# Patient Record
Sex: Male | Born: 1940 | Race: Black or African American | Hispanic: No | Marital: Married | Smoking: Never smoker
Health system: Southern US, Community
[De-identification: ages and names within clinical notes are randomized; demographics above are authoritative.]

## PROBLEM LIST (undated history)

## (undated) DIAGNOSIS — E7800 Pure hypercholesterolemia, unspecified: Secondary | ICD-10-CM

## (undated) DIAGNOSIS — M1990 Unspecified osteoarthritis, unspecified site: Secondary | ICD-10-CM

## (undated) DIAGNOSIS — I10 Essential (primary) hypertension: Secondary | ICD-10-CM

## (undated) DIAGNOSIS — N400 Enlarged prostate without lower urinary tract symptoms: Secondary | ICD-10-CM

## (undated) DIAGNOSIS — I872 Venous insufficiency (chronic) (peripheral): Secondary | ICD-10-CM

## (undated) DIAGNOSIS — M545 Low back pain: Secondary | ICD-10-CM

## (undated) DIAGNOSIS — I509 Heart failure, unspecified: Secondary | ICD-10-CM

## (undated) DIAGNOSIS — C61 Malignant neoplasm of prostate: Secondary | ICD-10-CM

## (undated) DIAGNOSIS — I639 Cerebral infarction, unspecified: Secondary | ICD-10-CM

## (undated) DIAGNOSIS — Z8739 Personal history of other diseases of the musculoskeletal system and connective tissue: Secondary | ICD-10-CM

## (undated) HISTORY — PX: KNEE ARTHROSCOPY W/ PCL AND LCL  REPAIR & TENDON GRAFT: SHX1883

## (undated) HISTORY — PX: BACK SURGERY: SHX140

## (undated) HISTORY — PX: TONSILLECTOMY: SUR1361

## (undated) HISTORY — DX: Malignant neoplasm of prostate: C61

## (undated) HISTORY — DX: Essential (primary) hypertension: I10

## (undated) HISTORY — DX: Low back pain: M54.5

## (undated) HISTORY — PX: CORRECTION HAMMER TOE: SUR317

## (undated) HISTORY — DX: Benign prostatic hyperplasia without lower urinary tract symptoms: N40.0

## (undated) HISTORY — DX: Unspecified osteoarthritis, unspecified site: M19.90

## (undated) HISTORY — PX: INGUINAL HERNIA REPAIR: SUR1180

---

## 1996-03-22 DIAGNOSIS — I639 Cerebral infarction, unspecified: Secondary | ICD-10-CM

## 1996-03-22 HISTORY — DX: Cerebral infarction, unspecified: I63.9

## 1997-09-22 ENCOUNTER — Emergency Department (HOSPITAL_COMMUNITY): Admission: EM | Admit: 1997-09-22 | Discharge: 1997-09-22 | Payer: Self-pay | Admitting: Emergency Medicine

## 1997-12-17 ENCOUNTER — Encounter: Payer: Self-pay | Admitting: Internal Medicine

## 1997-12-17 ENCOUNTER — Ambulatory Visit (HOSPITAL_COMMUNITY): Admission: RE | Admit: 1997-12-17 | Discharge: 1997-12-17 | Payer: Self-pay | Admitting: Internal Medicine

## 2000-01-08 ENCOUNTER — Encounter: Payer: Self-pay | Admitting: Emergency Medicine

## 2000-01-08 ENCOUNTER — Emergency Department (HOSPITAL_COMMUNITY): Admission: EM | Admit: 2000-01-08 | Discharge: 2000-01-08 | Payer: Self-pay | Admitting: Emergency Medicine

## 2000-02-01 ENCOUNTER — Emergency Department (HOSPITAL_COMMUNITY): Admission: EM | Admit: 2000-02-01 | Discharge: 2000-02-01 | Payer: Self-pay | Admitting: Emergency Medicine

## 2000-09-01 ENCOUNTER — Encounter: Payer: Self-pay | Admitting: Emergency Medicine

## 2000-09-01 ENCOUNTER — Emergency Department (HOSPITAL_COMMUNITY): Admission: EM | Admit: 2000-09-01 | Discharge: 2000-09-01 | Payer: Self-pay | Admitting: Emergency Medicine

## 2000-10-28 ENCOUNTER — Encounter: Payer: Self-pay | Admitting: Internal Medicine

## 2000-10-28 ENCOUNTER — Ambulatory Visit (HOSPITAL_COMMUNITY): Admission: RE | Admit: 2000-10-28 | Discharge: 2000-10-28 | Payer: Self-pay | Admitting: Internal Medicine

## 2000-12-28 ENCOUNTER — Encounter (INDEPENDENT_AMBULATORY_CARE_PROVIDER_SITE_OTHER): Payer: Self-pay

## 2000-12-28 ENCOUNTER — Other Ambulatory Visit: Admission: RE | Admit: 2000-12-28 | Discharge: 2000-12-28 | Payer: Self-pay | Admitting: Gastroenterology

## 2001-02-07 ENCOUNTER — Encounter: Payer: Self-pay | Admitting: Gastroenterology

## 2001-02-07 ENCOUNTER — Ambulatory Visit (HOSPITAL_COMMUNITY): Admission: RE | Admit: 2001-02-07 | Discharge: 2001-02-07 | Payer: Self-pay | Admitting: Gastroenterology

## 2001-03-31 ENCOUNTER — Encounter: Admission: RE | Admit: 2001-03-31 | Discharge: 2001-03-31 | Payer: Self-pay | Admitting: Urology

## 2001-03-31 ENCOUNTER — Encounter: Payer: Self-pay | Admitting: Urology

## 2001-05-25 ENCOUNTER — Ambulatory Visit (HOSPITAL_COMMUNITY): Admission: RE | Admit: 2001-05-25 | Discharge: 2001-05-25 | Payer: Self-pay | Admitting: Internal Medicine

## 2001-05-25 ENCOUNTER — Encounter: Payer: Self-pay | Admitting: Internal Medicine

## 2003-03-10 ENCOUNTER — Emergency Department (HOSPITAL_COMMUNITY): Admission: EM | Admit: 2003-03-10 | Discharge: 2003-03-10 | Payer: Self-pay | Admitting: Emergency Medicine

## 2003-11-11 ENCOUNTER — Ambulatory Visit (HOSPITAL_COMMUNITY): Admission: RE | Admit: 2003-11-11 | Discharge: 2003-11-11 | Payer: Self-pay | Admitting: Internal Medicine

## 2004-01-22 ENCOUNTER — Ambulatory Visit: Payer: Self-pay | Admitting: Gastroenterology

## 2005-04-10 ENCOUNTER — Encounter: Admission: RE | Admit: 2005-04-10 | Discharge: 2005-04-10 | Payer: Self-pay | Admitting: Family Medicine

## 2005-05-04 ENCOUNTER — Ambulatory Visit: Payer: Self-pay | Admitting: Gastroenterology

## 2005-05-18 ENCOUNTER — Ambulatory Visit: Payer: Self-pay | Admitting: Gastroenterology

## 2006-02-07 ENCOUNTER — Encounter: Admission: RE | Admit: 2006-02-07 | Discharge: 2006-02-07 | Payer: Self-pay | Admitting: Family Medicine

## 2006-04-14 ENCOUNTER — Encounter: Admission: RE | Admit: 2006-04-14 | Discharge: 2006-05-11 | Payer: Self-pay | Admitting: Neurology

## 2006-05-12 ENCOUNTER — Encounter: Admission: RE | Admit: 2006-05-12 | Discharge: 2006-07-21 | Payer: Self-pay | Admitting: Neurology

## 2006-05-21 ENCOUNTER — Emergency Department (HOSPITAL_COMMUNITY): Admission: EM | Admit: 2006-05-21 | Discharge: 2006-05-21 | Payer: Self-pay | Admitting: Emergency Medicine

## 2006-05-26 ENCOUNTER — Encounter: Admission: RE | Admit: 2006-05-26 | Discharge: 2006-05-26 | Payer: Self-pay | Admitting: Family Medicine

## 2006-06-22 ENCOUNTER — Ambulatory Visit: Payer: Self-pay | Admitting: Gastroenterology

## 2008-01-21 DIAGNOSIS — C61 Malignant neoplasm of prostate: Secondary | ICD-10-CM

## 2008-01-21 HISTORY — DX: Malignant neoplasm of prostate: C61

## 2008-01-21 HISTORY — PX: PROSTATE BIOPSY: SHX241

## 2008-01-26 ENCOUNTER — Encounter: Payer: Self-pay | Admitting: Internal Medicine

## 2008-01-26 ENCOUNTER — Ambulatory Visit: Payer: Self-pay | Admitting: Internal Medicine

## 2008-01-26 DIAGNOSIS — Z8679 Personal history of other diseases of the circulatory system: Secondary | ICD-10-CM | POA: Insufficient documentation

## 2008-01-26 DIAGNOSIS — M1990 Unspecified osteoarthritis, unspecified site: Secondary | ICD-10-CM | POA: Insufficient documentation

## 2008-01-26 DIAGNOSIS — S66819A Strain of other specified muscles, fascia and tendons at wrist and hand level, unspecified hand, initial encounter: Secondary | ICD-10-CM

## 2008-01-26 DIAGNOSIS — N400 Enlarged prostate without lower urinary tract symptoms: Secondary | ICD-10-CM

## 2008-01-26 DIAGNOSIS — S63599A Other specified sprain of unspecified wrist, initial encounter: Secondary | ICD-10-CM | POA: Insufficient documentation

## 2008-02-07 ENCOUNTER — Telehealth: Payer: Self-pay | Admitting: Internal Medicine

## 2008-02-09 ENCOUNTER — Ambulatory Visit: Payer: Self-pay | Admitting: Internal Medicine

## 2008-03-07 ENCOUNTER — Encounter: Payer: Self-pay | Admitting: Internal Medicine

## 2008-03-07 ENCOUNTER — Ambulatory Visit: Payer: Self-pay | Admitting: Internal Medicine

## 2008-03-22 DIAGNOSIS — M545 Low back pain, unspecified: Secondary | ICD-10-CM

## 2008-03-22 HISTORY — DX: Low back pain, unspecified: M54.50

## 2008-03-25 ENCOUNTER — Encounter: Admission: RE | Admit: 2008-03-25 | Discharge: 2008-04-03 | Payer: Self-pay | Admitting: Internal Medicine

## 2008-04-30 ENCOUNTER — Telehealth (INDEPENDENT_AMBULATORY_CARE_PROVIDER_SITE_OTHER): Payer: Self-pay | Admitting: *Deleted

## 2008-05-02 ENCOUNTER — Ambulatory Visit: Payer: Self-pay | Admitting: Internal Medicine

## 2008-05-02 DIAGNOSIS — Z8546 Personal history of malignant neoplasm of prostate: Secondary | ICD-10-CM

## 2008-05-02 LAB — CONVERTED CEMR LAB
Bilirubin Urine: NEGATIVE
CO2: 32 meq/L (ref 19–32)
Chloride: 101 meq/L (ref 96–112)
GFR calc Af Amer: 77 mL/min
Glucose, Bld: 113 mg/dL — ABNORMAL HIGH (ref 70–99)
HDL: 40.1 mg/dL (ref >39.0)
Hemoglobin, Urine: NEGATIVE
Leukocytes, UA: NEGATIVE
Lymphocytes Relative: 35.6 % (ref 12.0–46.0)
Monocytes Relative: 8.1 % (ref 3.0–12.0)
Neutrophils Relative %: 50.5 % (ref 43.0–77.0)
Nitrite: NEGATIVE
Platelets: 166 10*3/uL (ref 150–400)
Potassium: 3.8 meq/L (ref 3.5–5.1)
RDW: 11.6 % (ref 11.5–14.6)
Sodium: 141 meq/L (ref 135–145)
Total CHOL/HDL Ratio: 4.4
VLDL: 22 mg/dL (ref 0–40)

## 2008-05-07 ENCOUNTER — Encounter: Payer: Self-pay | Admitting: Internal Medicine

## 2008-05-07 LAB — CONVERTED CEMR LAB: Vit D, 1,25-Dihydroxy: 9 — ABNORMAL LOW (ref 30–89)

## 2008-05-15 ENCOUNTER — Telehealth: Payer: Self-pay | Admitting: Internal Medicine

## 2008-08-16 ENCOUNTER — Telehealth (INDEPENDENT_AMBULATORY_CARE_PROVIDER_SITE_OTHER): Payer: Self-pay | Admitting: *Deleted

## 2008-09-03 ENCOUNTER — Ambulatory Visit: Payer: Self-pay | Admitting: Internal Medicine

## 2008-09-03 DIAGNOSIS — E559 Vitamin D deficiency, unspecified: Secondary | ICD-10-CM

## 2008-09-27 ENCOUNTER — Ambulatory Visit: Payer: Self-pay | Admitting: Internal Medicine

## 2008-09-27 DIAGNOSIS — M545 Low back pain: Secondary | ICD-10-CM

## 2008-09-27 DIAGNOSIS — M79609 Pain in unspecified limb: Secondary | ICD-10-CM

## 2008-09-27 LAB — CONVERTED CEMR LAB
Bilirubin Urine: NEGATIVE
Leukocytes, UA: NEGATIVE
Nitrite: NEGATIVE
Specific Gravity, Urine: <=1.005 (ref 1.000–1.030)
pH: 6.5 (ref 5.0–8.0)

## 2008-10-11 ENCOUNTER — Ambulatory Visit: Payer: Self-pay | Admitting: Internal Medicine

## 2008-10-18 ENCOUNTER — Encounter: Admission: RE | Admit: 2008-10-18 | Discharge: 2008-10-18 | Payer: Self-pay | Admitting: Internal Medicine

## 2008-10-21 ENCOUNTER — Encounter: Payer: Self-pay | Admitting: Internal Medicine

## 2008-10-23 ENCOUNTER — Encounter: Payer: Self-pay | Admitting: Internal Medicine

## 2008-10-30 ENCOUNTER — Ambulatory Visit: Payer: Self-pay | Admitting: Internal Medicine

## 2008-11-13 ENCOUNTER — Ambulatory Visit: Payer: Self-pay | Admitting: Internal Medicine

## 2008-11-14 ENCOUNTER — Telehealth (INDEPENDENT_AMBULATORY_CARE_PROVIDER_SITE_OTHER): Payer: Self-pay | Admitting: *Deleted

## 2008-11-28 ENCOUNTER — Encounter: Admission: RE | Admit: 2008-11-28 | Discharge: 2009-02-26 | Payer: Self-pay | Admitting: Internal Medicine

## 2008-12-03 ENCOUNTER — Encounter: Payer: Self-pay | Admitting: Internal Medicine

## 2008-12-25 ENCOUNTER — Ambulatory Visit: Payer: Self-pay | Admitting: Internal Medicine

## 2009-01-09 ENCOUNTER — Encounter: Payer: Self-pay | Admitting: Internal Medicine

## 2009-03-13 ENCOUNTER — Ambulatory Visit: Payer: Self-pay | Admitting: Internal Medicine

## 2009-03-13 LAB — CONVERTED CEMR LAB
ALT: 27 units/L (ref 0–53)
Basophils Relative: 1.1 % (ref 0.0–3.0)
Bilirubin, Direct: 0.1 mg/dL (ref 0.0–0.3)
Calcium: 9.1 mg/dL (ref 8.4–10.5)
Chloride: 105 meq/L (ref 96–112)
Creatinine, Ser: 1.2 mg/dL (ref 0.4–1.5)
Eosinophils Absolute: 0.2 10*3/uL (ref 0.0–0.7)
HCT: 35.9 % — ABNORMAL LOW (ref 39.0–52.0)
Hemoglobin: 11.8 g/dL — ABNORMAL LOW (ref 13.0–17.0)
Lymphs Abs: 1.5 10*3/uL (ref 0.7–4.0)
MCHC: 32.8 g/dL (ref 30.0–36.0)
MCV: 85.9 fL (ref 78.0–100.0)
Monocytes Absolute: 0.4 10*3/uL (ref 0.1–1.0)
Neutro Abs: 1.8 10*3/uL (ref 1.4–7.7)
RBC: 4.18 M/uL — ABNORMAL LOW (ref 4.22–5.81)
Total Bilirubin: 1 mg/dL (ref 0.3–1.2)

## 2009-03-20 ENCOUNTER — Ambulatory Visit: Payer: Self-pay | Admitting: Internal Medicine

## 2009-03-20 DIAGNOSIS — T23209A Burn of second degree of unspecified hand, unspecified site, initial encounter: Secondary | ICD-10-CM | POA: Insufficient documentation

## 2009-04-23 ENCOUNTER — Telehealth: Payer: Self-pay | Admitting: Internal Medicine

## 2009-05-16 ENCOUNTER — Ambulatory Visit: Payer: Self-pay | Admitting: Internal Medicine

## 2009-05-16 DIAGNOSIS — R1013 Epigastric pain: Secondary | ICD-10-CM | POA: Insufficient documentation

## 2009-05-16 LAB — CONVERTED CEMR LAB
Albumin: 4.1 g/dL (ref 3.5–5.2)
BUN: 12 mg/dL (ref 6–23)
Calcium: 9.2 mg/dL (ref 8.4–10.5)
Eosinophils Relative: 5.3 % — ABNORMAL HIGH (ref 0.0–5.0)
GFR calc non Af Amer: 77.04 mL/min (ref >60)
Glucose, Bld: 92 mg/dL (ref 70–99)
HCT: 35.4 % — ABNORMAL LOW (ref 39.0–52.0)
Hemoglobin: 11.5 g/dL — ABNORMAL LOW (ref 13.0–17.0)
Ketones, ur: NEGATIVE mg/dL
Leukocytes, UA: NEGATIVE
Lipase: 15 units/L (ref 11.0–59.0)
Lymphocytes Relative: 43.6 % (ref 12.0–46.0)
Lymphs Abs: 1.8 10*3/uL (ref 0.7–4.0)
Monocytes Relative: 9.8 % (ref 3.0–12.0)
Neutro Abs: 1.6 10*3/uL (ref 1.4–7.7)
Platelets: 171 10*3/uL (ref 150.0–400.0)
Specific Gravity, Urine: <=1.005 (ref 1.000–1.030)
Total Protein: 7 g/dL (ref 6.0–8.3)
Urobilinogen, UA: 0.2 (ref 0.0–1.0)
WBC: 4 10*3/uL — ABNORMAL LOW (ref 4.5–10.5)
pH: 6 (ref 5.0–8.0)

## 2009-05-20 ENCOUNTER — Encounter: Admission: RE | Admit: 2009-05-20 | Discharge: 2009-05-20 | Payer: Self-pay | Admitting: Internal Medicine

## 2009-05-28 ENCOUNTER — Ambulatory Visit: Payer: Self-pay | Admitting: Internal Medicine

## 2009-08-06 ENCOUNTER — Ambulatory Visit: Payer: Self-pay | Admitting: Internal Medicine

## 2009-08-06 DIAGNOSIS — M109 Gout, unspecified: Secondary | ICD-10-CM | POA: Insufficient documentation

## 2009-08-07 LAB — CONVERTED CEMR LAB
Rhuematoid fact SerPl-aCnc: 20 intl units/mL (ref 0.0–20.0)
Sed Rate: 10 mm/hr (ref 0–22)
Uric Acid, Serum: 8.7 mg/dL — ABNORMAL HIGH (ref 4.0–7.8)

## 2009-09-24 ENCOUNTER — Ambulatory Visit: Payer: Self-pay | Admitting: Internal Medicine

## 2009-09-24 LAB — CONVERTED CEMR LAB
BUN: 16 mg/dL (ref 6–23)
Bilirubin, Direct: 0.2 mg/dL (ref 0.0–0.3)
Calcium: 9.3 mg/dL (ref 8.4–10.5)
Chloride: 107 meq/L (ref 96–112)
Creatinine, Ser: 1.2 mg/dL (ref 0.4–1.5)
GFR calc non Af Amer: 79.25 mL/min (ref >60)
Hgb A1c MFr Bld: 5.1 % (ref 4.6–6.5)
Total Bilirubin: 0.9 mg/dL (ref 0.3–1.2)

## 2009-10-15 ENCOUNTER — Ambulatory Visit: Payer: Self-pay | Admitting: Internal Medicine

## 2009-10-21 ENCOUNTER — Encounter: Payer: Self-pay | Admitting: Internal Medicine

## 2009-12-18 ENCOUNTER — Ambulatory Visit: Payer: Self-pay | Admitting: Internal Medicine

## 2009-12-18 DIAGNOSIS — K120 Recurrent oral aphthae: Secondary | ICD-10-CM

## 2009-12-22 ENCOUNTER — Telehealth: Payer: Self-pay | Admitting: Internal Medicine

## 2010-01-08 ENCOUNTER — Telehealth: Payer: Self-pay | Admitting: Internal Medicine

## 2010-01-13 ENCOUNTER — Telehealth: Payer: Self-pay | Admitting: Internal Medicine

## 2010-04-16 ENCOUNTER — Ambulatory Visit
Admission: RE | Admit: 2010-04-16 | Discharge: 2010-04-16 | Payer: Self-pay | Source: Home / Self Care | Attending: Internal Medicine | Admitting: Internal Medicine

## 2010-04-16 ENCOUNTER — Other Ambulatory Visit: Payer: Self-pay | Admitting: Internal Medicine

## 2010-04-16 DIAGNOSIS — H6120 Impacted cerumen, unspecified ear: Secondary | ICD-10-CM | POA: Insufficient documentation

## 2010-04-16 DIAGNOSIS — R1310 Dysphagia, unspecified: Secondary | ICD-10-CM | POA: Insufficient documentation

## 2010-04-16 LAB — BASIC METABOLIC PANEL
BUN: 17 mg/dL (ref 6–23)
Calcium: 9.6 mg/dL (ref 8.4–10.5)
Creatinine, Ser: 1.1 mg/dL (ref 0.4–1.5)
GFR: 84.96 mL/min (ref >60.00)
Glucose, Bld: 96 mg/dL (ref 70–99)

## 2010-04-16 LAB — CBC WITH DIFFERENTIAL/PLATELET
Basophils Absolute: 0 10*3/uL (ref 0.0–0.1)
Eosinophils Relative: 4.1 % (ref 0.0–5.0)
Lymphocytes Relative: 35 % (ref 12.0–46.0)
Lymphs Abs: 1.6 10*3/uL (ref 0.7–4.0)
Monocytes Relative: 8 % (ref 3.0–12.0)
Neutrophils Relative %: 52 % (ref 43.0–77.0)
Platelets: 167 10*3/uL (ref 150.0–400.0)
RDW: 12.9 % (ref 11.5–14.6)
WBC: 4.5 10*3/uL (ref 4.5–10.5)

## 2010-04-16 LAB — LIPID PANEL
Cholesterol: 186 mg/dL (ref 0–200)
LDL Cholesterol: 128 mg/dL — ABNORMAL HIGH (ref 0–99)
Total CHOL/HDL Ratio: 5
VLDL: 17.2 mg/dL (ref 0.0–40.0)

## 2010-04-16 LAB — URINALYSIS
Bilirubin Urine: NEGATIVE
Hemoglobin, Urine: NEGATIVE
Nitrite: NEGATIVE
Total Protein, Urine: NEGATIVE
Urine Glucose: NEGATIVE
Urobilinogen, UA: 0.2 (ref 0.0–1.0)

## 2010-04-16 LAB — HEPATIC FUNCTION PANEL
AST: 26 U/L (ref 0–37)
Bilirubin, Direct: 0.1 mg/dL (ref 0.0–0.3)
Total Bilirubin: 0.9 mg/dL (ref 0.3–1.2)

## 2010-04-21 NOTE — Assessment & Plan Note (Signed)
Summary: painful swollen hand  stc   Vital Signs:  Patient profile:   70 year old male Height:      76 inches Weight:      228.75 pounds BMI:     27.94 O2 Sat:      97 % on Room air Temp:     97.3 degrees F oral Pulse rate:   72 / minute BP sitting:   132 / 60  (left arm) Cuff size:   large  Vitals Entered By: Lucious Groves (Aug 06, 2009 2:55 PM)  O2 Flow:  Room air CC: C/O right hand freezing and swollen since yesterday./kb Is Patient Diabetic? No Pain Assessment Patient in pain? no      Comments Patient states that he is not taking Benicar or Flomax./kb   Primary Care Provider:  Tresa Garter MD  CC:  C/O right hand freezing and swollen since yesterday./kb.  History of Present Illness: C/o R hand joints swell acutely w/a lot of pain yesterday C/o pain 15 min after taking meds x 4 months  F/u HTN  Current Medications (verified): 1)  Labetalol Hcl 200 Mg Tabs (Labetalol Hcl) .... 2 Two Times A Day 2)  Bayer Low Strength 81 Mg Tbec (Aspirin) .... Take 1 Tablet By Mouth Once A Day 3)  Voltaren 1 %  Gel (Diclofenac Sodium) .... Two Times A Day  To Qid As Needed 4)  Felodipine 5 Mg Xr24h-Tab (Felodipine) .... Take 1 Tablet By Mouth Once A Day 5)  Claritin 10 Mg Tabs (Loratadine) .... Once Daily 6)  Flomax 0.4 Mg Cp24 (Tamsulosin Hcl) .... Take 1 Capsule By Mouth Nightly 7)  Vitamin D3 1000 Unit  Tabs (Cholecalciferol) .Marland Kitchen.. 1 By Mouth Daily 8)  Benicar 20 Mg Tabs (Olmesartan Medoxomil) .Marland Kitchen.. 1 Tablet By Mouth Daily 9)  Aciphex 20 Mg Tbec (Rabeprazole Sodium) .Marland Kitchen.. 1 By Mouth Once Daily Prn  Allergies (verified): 1)  ! Valium 2)  ! Cardura 3)  ! Norvasc 4)  * Altace 5)  * Lisinopril  Past History:  Social History: Last updated: 05/02/2008 Retired from [employer] Married 2 ch Never Smoked Alcohol use-no Drug use-no Regular exercise-yes  Past Medical History: Hypertension Osteoarthritis Benign prostatic hypertrophy  Dr Brunilda Payor Cerebrovascular accident, hx  of Prostate cancer, hx of 2009 Colon 2008 Dr Laurice Record D def 2010 Low back pain 2010 Gout  Physical Exam  General:  NAD Mouth:  Oral mucosa and oropharynx without lesions or exudates.  Teeth in good repair. Lungs:  Normal respiratory effort, chest expands symmetrically. Lungs are clear to auscultation, no crackles or wheezes. Heart:  Normal rate and regular rhythm. S1 and S2 normal without gallop, murmur, click, rub or other extra sounds. Abdomen:  Bowel sounds positive,abdomen soft and non-tender without masses, organomegaly or hernias noted. Msk:  R 2nd MCP swollen and tender Neurologic:  No cranial nerve deficits noted. Station and gait are normal. Plantar reflexes are down-going bilaterally. DTRs are symmetrical throughout. Sensory, motor and coordinative functions appear intact.   Impression & Recommendations:  Problem # 1:  HAND PAIN (ICD-729.5) R 2nd MCP likely due to #2 Assessment New Hydrocdone prn Orders: TLB-Sedimentation Rate (ESR) (85652-ESR) TLB-Uric Acid, Blood (84550-URIC) TLB-Rheumatoid Factor (RA) (47829-FA)  Problem # 2:  GOUT (ICD-274.9) Assessment: Deteriorated Predn taper  Problem # 3:  ABDOMINAL PAIN, EPIGASTRIC (ICD-789.06) Assessment: Comment Only Take meds w/food. RTC if not corrected the problem  Problem # 4:  HYPERTENSION (ICD-401.9) Assessment: Improved  His updated medication list for  this problem includes:    Labetalol Hcl 200 Mg Tabs (Labetalol hcl) .Marland Kitchen... 2 two times a day    Felodipine 5 Mg Xr24h-tab (Felodipine) .Marland Kitchen... Take 1 tablet by mouth once a day    Benicar 20 Mg Tabs (Olmesartan medoxomil) .Marland Kitchen... 1 tablet by mouth daily  BP today: 132/60 Prior BP: 154/72 (05/28/2009)  Prior 10 Yr Risk Heart Disease: Not enough information (01/26/2008)  Labs Reviewed: K+: 3.8 (05/16/2009) Creat: : 1.2 (05/16/2009)   Chol: 177 (05/02/2008)   HDL: 40.1 (05/02/2008)   LDL: 115 (05/02/2008)   TG: 111 (05/02/2008)  Complete Medication List: 1)   Labetalol Hcl 200 Mg Tabs (Labetalol hcl) .... 2 two times a day 2)  Bayer Low Strength 81 Mg Tbec (Aspirin) .... Take 1 tablet by mouth once a day 3)  Felodipine 5 Mg Xr24h-tab (Felodipine) .... Take 1 tablet by mouth once a day 4)  Claritin 10 Mg Tabs (Loratadine) .... Once daily 5)  Flomax 0.4 Mg Cp24 (Tamsulosin hcl) .... Take 1 capsule by mouth nightly 6)  Vitamin D3 1000 Unit Tabs (Cholecalciferol) .Marland Kitchen.. 1 by mouth daily 7)  Benicar 20 Mg Tabs (Olmesartan medoxomil) .Marland Kitchen.. 1 tablet by mouth daily 8)  Aciphex 20 Mg Tbec (Rabeprazole sodium) .Marland Kitchen.. 1 by mouth once daily prn 9)  Prednisone 10 Mg Tabs (Prednisone) .... Take 40mg  qd for 1 days, then 20 mg qd for 2 days, then 10mg  qd for 1 days, then stop. take pc as needed gout 10)  Hydrocodone-acetaminophen 10-325 Mg Tabs (Hydrocodone-acetaminophen) .Marland Kitchen.. 1 by mouth two times a day by mouth bid  Patient Instructions: 1)  Call if you are not better in a reasonable amount of time or if worse.  2)  Take meds with food Prescriptions: HYDROCODONE-ACETAMINOPHEN 10-325 MG TABS (HYDROCODONE-ACETAMINOPHEN) 1 by mouth two times a day by mouth bid  #60 x 0   Entered and Authorized by:   Tresa Garter MD   Signed by:   Tresa Garter MD on 08/06/2009   Method used:   Print then Give to Patient   RxID:   [id number] PREDNISONE 10 MG TABS (PREDNISONE) Take 40mg  qd for 1 days, then 20 mg qd for 2 days, then 10mg  qd for 1 days, then stop. Take pc as needed gout  #30 x 1   Entered and Authorized by:   Tresa Garter MD   Signed by:   Tresa Garter MD on 08/06/2009   Method used:   Electronically to        [facility] [address] [phone]* (retail)       [address]       [place], [place]  [address]       Ph: [phone] or [phone]       Fax: [phone]   RxID:   [id number]

## 2010-04-21 NOTE — Miscellaneous (Signed)
Summary: [facility]  [facility]   Imported By: Sherian Rein 05/06/2009 07:58:38  _____________________________________________________________________  External Attachment:    Type:   Image     Comment:   External Document

## 2010-04-21 NOTE — Progress Notes (Signed)
Summary: PA-Aciphex  Phone Note From Pharmacy   Summary of Call: PA-Aciphex is not covered by insurance .Insurance will cover omeprazole or nexium. Initial call taken by: Dagoberto Reef,  January 13, 2010 10:19 AM  Follow-up for Phone Call        ok Nexium Follow-up by: Tresa Garter MD,  January 13, 2010 5:49 PM  Additional Follow-up for Phone Call Additional follow up Details #1::        Pt informed  Additional Follow-up by: Lamar Sprinkles, CMA,  January 14, 2010 3:04 PM    New/Updated Medications: NEXIUM 40 MG CPDR (ESOMEPRAZOLE MAGNESIUM) 1 by mouth qam ( medically necessary) Prescriptions: NEXIUM 40 MG CPDR (ESOMEPRAZOLE MAGNESIUM) 1 by mouth qam ( medically necessary)  #30 x 12   Entered and Authorized by:   Tresa Garter MD   Signed by:   Lamar Sprinkles, CMA on 01/14/2010   Method used:   Electronically to        [facility] [address] [phone]* (retail)       [address]       [place], [place]  [address]       Ph: [phone] or [phone]       Fax: [phone]   RxID:   [id number]

## 2010-04-21 NOTE — Miscellaneous (Signed)
Summary: Initial Summary for [facility]  Initial Summary for PT/[facility]   Imported By: Sherian Rein 03/26/2009 09:52:30  _____________________________________________________________________  External Attachment:    Type:   Image     Comment:   External Document

## 2010-04-21 NOTE — Assessment & Plan Note (Signed)
Summary: ROOF OF MOUTH IS SORE/ HEAD HURTS ON R SIDE/NWS   Vital Signs:  Patient profile:   70 year old male Height:      76 inches Weight:      233 pounds BMI:     28.46 Temp:     97.2 degrees F oral Pulse rate:   80 / minute Pulse rhythm:   regular Resp:     16 per minute BP sitting:   160 / 88  (left arm) Cuff size:   regular  Vitals Entered By: Lanier Prude, Beverly Gust) (December 18, 2009 11:33 AM) CC: mouth sores and Rt side of head hurts Is Patient Diabetic? No Comments pt would like to change Benicar to Cozaar   Primary Care Provider:  Tresa Garter MD  CC:  mouth sores and Rt side of head hurts.  History of Present Illness: C/o painful ulcer on mouth roof on L since  Sat F/u HTN, occasional rash  Current Medications (verified): 1)  Labetalol Hcl 200 Mg Tabs (Labetalol Hcl) .... 2 Two Times A Day 2)  Bayer Low Strength 81 Mg Tbec (Aspirin) .... Take 1 Tablet By Mouth Once A Day 3)  Felodipine 5 Mg Xr24h-Tab (Felodipine) .... Take 1 Tablet By Mouth Once A Day 4)  Claritin 10 Mg Tabs (Loratadine) .... Once Daily 5)  Flomax 0.4 Mg Cp24 (Tamsulosin Hcl) .... Take 1 Capsule By Mouth Nightly 6)  Vitamin D3 1000 Unit  Tabs (Cholecalciferol) .Marland Kitchen.. 1 By Mouth Daily 7)  Benicar 20 Mg Tabs (Olmesartan Medoxomil) .Marland Kitchen.. 1 Tablet By Mouth Daily 8)  Aciphex 20 Mg Tbec (Rabeprazole Sodium) .Marland Kitchen.. 1 By Mouth Once Daily Prn 9)  Hydrocodone-Acetaminophen 10-325 Mg Tabs (Hydrocodone-Acetaminophen) .Marland Kitchen.. 1 By Mouth Two Times A Day By Mouth Bid  Allergies (verified): 1)  ! Valium 2)  ! Cardura 3)  ! Norvasc 4)  * Altace 5)  * Lisinopril  Past History:  Past Medical History: Last updated: 08/06/2009 Hypertension Osteoarthritis Benign prostatic hypertrophy  Dr Brunilda Payor Cerebrovascular accident, hx of Prostate cancer, hx of 2009 Colon 2008 Dr Laurice Record D def 2010 Low back pain 2010 Gout  Social History: Last updated: 05/02/2008 Retired from [employer] Married 2 ch Never  Smoked Alcohol use-no Drug use-no Regular exercise-yes  Review of Systems  The patient denies fever.    Physical Exam  General:  NAD Nose:  External nasal examination shows no deformity or inflammation. Nasal mucosa are pink and moist without lesions or exudates. Mouth:  1.4x0.6 cm clustr of erosions to th L from centr on hard palet Lungs:  Normal respiratory effort, chest expands symmetrically. Lungs are clear to auscultation, no crackles or wheezes. Heart:  Normal rate and regular rhythm. S1 and S2 normal without gallop, murmur, click, rub or other extra sounds. Abdomen:  Bowel sounds positive,abdomen soft and non-tender without masses, organomegaly or hernias noted. Msk:  R 2nd MCP swollen and tender - cyst L foot callus Skin:  Clear Psych:  Cognition and judgment appear intact. Alert and cooperative with normal attention span and concentration. No apparent delusions, illusions, hallucinations   Impression & Recommendations:  Problem # 1:  APHTHOUS ULCERS (ICD-528.2) Assessment New See "Patient Instructions" and meds.  Problem # 2:  HYPERTENSION (ICD-401.9) Assessment: Unchanged  The following medications were removed from the medication list:    Benicar 20 Mg Tabs (Olmesartan medoxomil) .Marland Kitchen... 1 tablet by mouth daily His updated medication list for this problem includes:    Labetalol Hcl 200 Mg Tabs (Labetalol  hcl) ..... 2 two times a day    Felodipine 5 Mg Xr24h-tab (Felodipine) .Marland Kitchen... Take 1 tablet by mouth once a day    Cozaar 100 Mg Tabs (Losartan potassium) .Marland Kitchen... 1 by mouth qd  Problem # 3:  ECZEMA (ICD-692.9) Assessment: Unchanged  His updated medication list for this problem includes:    Claritin 10 Mg Tabs (Loratadine) ..... Once daily    Clobetasol Propionate 0.05 % Crea (Clobetasol propionate) ..... Use bid  Complete Medication List: 1)  Labetalol Hcl 200 Mg Tabs (Labetalol hcl) .... 2 two times a day 2)  Bayer Low Strength 81 Mg Tbec (Aspirin) .... Take 1  tablet by mouth once a day 3)  Felodipine 5 Mg Xr24h-tab (Felodipine) .... Take 1 tablet by mouth once a day 4)  Claritin 10 Mg Tabs (Loratadine) .... Once daily 5)  Flomax 0.4 Mg Cp24 (Tamsulosin hcl) .... Take 1 capsule by mouth nightly 6)  Vitamin D3 1000 Unit Tabs (Cholecalciferol) .Marland Kitchen.. 1 by mouth daily 7)  Aciphex 20 Mg Tbec (Rabeprazole sodium) .Marland Kitchen.. 1 by mouth once daily prn 8)  Hydrocodone-acetaminophen 10-325 Mg Tabs (Hydrocodone-acetaminophen) .Marland Kitchen.. 1 by mouth two times a day by mouth bid 9)  Acyclovir 400 Mg Tabs (Acyclovir) .Marland Kitchen.. 1 by mouth three times a day as needed herpes x 7 d 10)  Majic Mouthwash  .Marland Kitchen.. 10 ml - swish, hold and swallow qid 11)  Oragesic Soln (Mouthwashes) 12)  Cozaar 100 Mg Tabs (Losartan potassium) .Marland Kitchen.. 1 by mouth qd 13)  Clobetasol Propionate 0.05 % Crea (Clobetasol propionate) .... Use bid  Other Orders: Flu Vaccine 64yrs + MEDICARE PATIENTS (G9562) Administration Flu vaccine - MCR (Z3086)  Patient Instructions: 1)  Can use oragel 2)  Call if you are not better in a reasonable amount of time or if worse.  Prescriptions: CLOBETASOL PROPIONATE 0.05 % CREA (CLOBETASOL PROPIONATE) use bid  #90 g x 3   Entered and Authorized by:   Tresa Garter MD   Signed by:   Tresa Garter MD on 12/18/2009   Method used:   Print then Give to Patient   RxID:   [id number] COZAAR 100 MG TABS (LOSARTAN POTASSIUM) 1 by mouth qd  #30 x 11   Entered and Authorized by:   Tresa Garter MD   Signed by:   Tresa Garter MD on 12/18/2009   Method used:   Print then Give to Patient   RxID:   [id number] HYDROCODONE-ACETAMINOPHEN 10-325 MG TABS (HYDROCODONE-ACETAMINOPHEN) 1 by mouth two times a day by mouth bid  #30 x 0   Entered and Authorized by:   Tresa Garter MD   Signed by:   Tresa Garter MD on 12/18/2009   Method used:   Print then Give to Patient   RxID:   [id number] MAJIC MOUTHWASH 10 ml - swish, hold and swallow qid   #300 ml x 0   Entered and Authorized by:   Tresa Garter MD   Signed by:   Tresa Garter MD on 12/18/2009   Method used:   Print then Give to Patient   RxID:   [id number] ACYCLOVIR 400 MG TABS (ACYCLOVIR) 1 by mouth three times a day as needed herpes x 7 d  #21 x 3   Entered and Authorized by:   Tresa Garter MD   Signed by:   Tresa Garter MD on 12/18/2009   Method used:   Print then Give to Patient   RxID:  [id number]  .lbmedflu   Flu Vaccine Consent Questions     Do you have a history of severe allergic reactions to this vaccine? no    Any prior history of allergic reactions to egg and/or gelatin? no    Do you have a sensitivity to the preservative Thimersol? no    Do you have a past history of Guillan-Barre Syndrome? no    Do you currently have an acute febrile illness? no    Have you ever had a severe reaction to latex? no    Vaccine information given and explained to patient? yes    Are you currently pregnant? no    Lot Number:[id number]   Exp Date:09/19/2010   Site Given  Right Deltoid IM Lanier Prude, [facility])  December 18, 2009 11:38 AM

## 2010-04-21 NOTE — Progress Notes (Signed)
Summary: refill  Phone Note Refill Request   Refills Requested: Medication #1:  LABETALOL HCL 200 MG TABS 2 two times a day Pharmacy states that they never rec'd refill x12 in 08/2008. I refaxed refills for patient.  Initial call taken by: Lucious Groves,  April 23, 2009 10:32 AM    Prescriptions: LABETALOL HCL 200 MG TABS (LABETALOL HCL) 2 two times a day  #120 Tablet x 6   Entered by:   Lucious Groves   Authorized by:   Tresa Garter MD   Signed by:   Lucious Groves on 04/23/2009   Method used:   Re-Faxed to ...       [facility]  [address] [phone]* (retail)       [address]       [place], [place]  [address]       Ph: [phone] or [phone]       Fax: [phone]   RxID:   [id number]

## 2010-04-21 NOTE — Progress Notes (Signed)
Summary: MOUTHWASH  Phone Note Call from Patient   Summary of Call: Patient is requesting orajesic mouthwash to go to [facility]   Also needs majic mouthwash, it was lost. This is resent but please advise of above request.  Initial call taken by: Lamar Sprinkles, CMA,  December 22, 2009 10:33 AM  Follow-up for Phone Call        ok oragesic (it is OTC) too  Follow-up by: Tresa Garter MD,  December 22, 2009 1:23 PM  Additional Follow-up for Phone Call Additional follow up Details #1::        Pt informed  Additional Follow-up by: Lamar Sprinkles, CMA,  December 22, 2009 1:38 PM    New/Updated Medications: ORAGESIC  SOLN (MOUTHWASHES) as dirr Prescriptions: ORAGESIC  SOLN (MOUTHWASHES) as dirr  #1 x 1   Entered and Authorized by:   Tresa Garter MD   Signed by:   Lamar Sprinkles, CMA on 12/22/2009   Method used:   Faxed to ...       [facility]  [address] [phone]* (retail)       [address]       [place], [place]  [address]       Ph: [phone] or [phone]       Fax: [phone]   RxID:   [id number] MAJIC MOUTHWASH 10 ml - swish, hold and swallow qid  #300 ml x 0   Entered by:   Lamar Sprinkles, CMA   Authorized by:   Tresa Garter MD   Signed by:   Lamar Sprinkles, CMA on 12/22/2009   Method used:   Faxed to ...       [facility]  [address] [phone]* (retail)       [address]       [place], [place]  [address]       Ph: [phone] or [phone]       Fax: [phone]   RxID:   [id number]

## 2010-04-21 NOTE — Assessment & Plan Note (Signed)
Summary: 4 MO ROV /NWS  #   Vital Signs:  Patient profile:   70 year old male Height:      76 inches Weight:      235 pounds BMI:     28.71 O2 Sat:      96 % on Room air Temp:     98.1 degrees F oral Pulse rate:   77 / minute Pulse rhythm:   regular Resp:     16 per minute BP sitting:   160 / 80  (left arm) Cuff size:   large  Vitals Entered By: Lanier Prude, CMA(AAMA) (October 15, 2009 1:42 PM)  O2 Flow:  Room air CC: 4 mo f/u Is Patient Diabetic? No Comments pt needs Rf on Benicar 20mg  1 once daily .   Primary Care Provider:  Tresa Garter MD  CC:  4 mo f/u.  History of Present Illness: The patient presents for a follow up of hypertension, LBP C/o RUQ pain at times - long time   Current Medications (verified): 1)  Labetalol Hcl 200 Mg Tabs (Labetalol Hcl) .... 2 Two Times A Day 2)  Bayer Low Strength 81 Mg Tbec (Aspirin) .... Take 1 Tablet By Mouth Once A Day 3)  Felodipine 5 Mg Xr24h-Tab (Felodipine) .... Take 1 Tablet By Mouth Once A Day 4)  Claritin 10 Mg Tabs (Loratadine) .... Once Daily 5)  Flomax 0.4 Mg Cp24 (Tamsulosin Hcl) .... Take 1 Capsule By Mouth Nightly 6)  Vitamin D3 1000 Unit  Tabs (Cholecalciferol) .Marland Kitchen.. 1 By Mouth Daily 7)  Benicar 20 Mg Tabs (Olmesartan Medoxomil) .Marland Kitchen.. 1 Tablet By Mouth Daily 8)  Aciphex 20 Mg Tbec (Rabeprazole Sodium) .Marland Kitchen.. 1 By Mouth Once Daily Prn 9)  Prednisone 10 Mg Tabs (Prednisone) .... Take 40mg  Qd For 1 Days, Then 20 Mg Qd For 2 Days, Then 10mg  Qd For 1 Days, Then Stop. Take Pc As Needed Gout 10)  Hydrocodone-Acetaminophen 10-325 Mg Tabs (Hydrocodone-Acetaminophen) .Marland Kitchen.. 1 By Mouth Two Times A Day By Mouth Bid  Allergies (verified): 1)  ! Valium 2)  ! Cardura 3)  ! Norvasc 4)  * Altace 5)  * Lisinopril  Past History:  Past Medical History: Last updated: 08/06/2009 Hypertension Osteoarthritis Benign prostatic hypertrophy  Dr Brunilda Payor Cerebrovascular accident, hx of Prostate cancer, hx of 2009 Colon 2008 Dr  Laurice Record D def 2010 Low back pain 2010 Gout  Past Surgical History: Last updated: 05/02/2008 Denies surgical history XRT seeds 2009  Social History: Last updated: 05/02/2008 Retired from [employer] Married 2 ch Never Smoked Alcohol use-no Drug use-no Regular exercise-yes  Family History: Reviewed history from 05/16/2009 and no changes required. Family History Diabetes 1st degree relative Family History Hypertension Family History of Cardiovascular disorder No GS  Social History: Reviewed history from 05/02/2008 and no changes required. Retired from [employer] Married 2 ch Never Smoked Alcohol use-no Drug use-no Regular exercise-yes  Review of Systems  The patient denies fever, dyspnea on exertion, hematochezia, and depression.    Physical Exam  General:  NAD Head:  Normocephalic and atraumatic without obvious abnormalities. No apparent alopecia or balding. Eyes:  No corneal or conjunctival inflammation noted. EOMI. Perrla. Mouth:  Oral mucosa and oropharynx without lesions or exudates.  Teeth in good repair. Neck:  No deformities, masses, or tenderness noted. Lungs:  Normal respiratory effort, chest expands symmetrically. Lungs are clear to auscultation, no crackles or wheezes. Heart:  Normal rate and regular rhythm. S1 and S2 normal without gallop, murmur, click, rub  or other extra sounds. Abdomen:  Bowel sounds positive,abdomen soft and non-tender without masses, organomegaly or hernias noted. Msk:  R 2nd MCP swollen and tender - cyst L foot callus Neurologic:  No cranial nerve deficits noted. Station and gait are normal. Plantar reflexes are down-going bilaterally. DTRs are symmetrical throughout. Sensory, motor and coordinative functions appear intact. Skin:  L dorsal hand erosions Psych:  Cognition and judgment appear intact. Alert and cooperative with normal attention span and concentration. No apparent delusions, illusions, hallucinations   Impression &  Recommendations:  Problem # 1:  LOW BACK PAIN (ICD-724.2) Assessment Improved  His updated medication list for this problem includes:    Bayer Low Strength 81 Mg Tbec (Aspirin) .Marland Kitchen... Take 1 tablet by mouth once a day    Hydrocodone-acetaminophen 10-325 Mg Tabs (Hydrocodone-acetaminophen) .Marland Kitchen... 1 by mouth two times a day by mouth bid  Problem # 2:  HAND PAIN (ICD-729.5) R 3d finger ganglion Assessment: New  Orders: Orthopedic Referral (Ortho)  Problem # 3:  LEG PAIN (ICD-729.5) - L foot pain - callus Assessment: New Trimmed w/a blade  Problem # 4:  OSTEOARTHRITIS (ICD-715.90) Assessment: Unchanged  His updated medication list for this problem includes:    Bayer Low Strength 81 Mg Tbec (Aspirin) .Marland Kitchen... Take 1 tablet by mouth once a day    Hydrocodone-acetaminophen 10-325 Mg Tabs (Hydrocodone-acetaminophen) .Marland Kitchen... 1 by mouth two times a day by mouth bid  Problem # 5:  HYPERTENSION (ICD-401.9) Assessment: Comment Only States BP is good at home. Call me if BP is up His updated medication list for this problem includes:    Labetalol Hcl 200 Mg Tabs (Labetalol hcl) .Marland Kitchen... 2 two times a day    Felodipine 5 Mg Xr24h-tab (Felodipine) .Marland Kitchen... Take 1 tablet by mouth once a day    Benicar 20 Mg Tabs (Olmesartan medoxomil) .Marland Kitchen... 1 tablet by mouth daily  Problem # 6:  ABDOMINAL PAIN, EPIGASTRIC (ICD-789.06)  unclear etiol. - poss billiary dysfunction Assessment: Comment Only  Complete Medication List: 1)  Labetalol Hcl 200 Mg Tabs (Labetalol hcl) .... 2 two times a day 2)  Bayer Low Strength 81 Mg Tbec (Aspirin) .... Take 1 tablet by mouth once a day 3)  Felodipine 5 Mg Xr24h-tab (Felodipine) .... Take 1 tablet by mouth once a day 4)  Claritin 10 Mg Tabs (Loratadine) .... Once daily 5)  Flomax 0.4 Mg Cp24 (Tamsulosin hcl) .... Take 1 capsule by mouth nightly 6)  Vitamin D3 1000 Unit Tabs (Cholecalciferol) .Marland Kitchen.. 1 by mouth daily 7)  Benicar 20 Mg Tabs (Olmesartan medoxomil) .Marland Kitchen.. 1 tablet by  mouth daily 8)  Aciphex 20 Mg Tbec (Rabeprazole sodium) .Marland Kitchen.. 1 by mouth once daily prn 9)  Hydrocodone-acetaminophen 10-325 Mg Tabs (Hydrocodone-acetaminophen) .Marland Kitchen.. 1 by mouth two times a day by mouth bid  Other Orders: Prescription Created Electronically [id number] [id number])  Patient Instructions: 1)  Please schedule a follow-up appointment in 6 months well w/labs. Prescriptions: CLARITIN 10 MG TABS (LORATADINE) once daily  #90 x 3   Entered and Authorized by:   Tresa Garter MD   Signed by:   Tresa Garter MD on 10/15/2009   Method used:   Electronically to        [facility] [address] [phone]* (retail)       [address]       [place], [place]  [address]       Ph: [phone] or [phone]  Fax: [phone]   RxID:   [id number] BENICAR 20 MG TABS (OLMESARTAN MEDOXOMIL) 1 tablet by mouth daily  #90 x 3   Entered and Authorized by:   Tresa Garter MD   Signed by:   Tresa Garter MD on 10/15/2009   Method used:   Electronically to        [facility] [address] [phone]* (retail)       [address]       [place], [place]  [address]       Ph: [phone] or [phone]       Fax: [phone]   RxID:   [id number]

## 2010-04-21 NOTE — Assessment & Plan Note (Signed)
Summary: 2 mos f/u /#/cd   Vital Signs:  Patient profile:   70 year old male Weight:      226 pounds Temp:     97.8 degrees F oral Pulse rate:   69 / minute BP sitting:   156 / 84  (left arm)  Vitals Entered By: Tora Perches (May 16, 2009 2:02 PM) CC: f/u Is Patient Diabetic? No   Primary Care Provider:  Tresa Garter MD  CC:  f/u.  History of Present Illness: C/o RUQ abd pain x 2 months after the time when he is taking his meds. In pain now and nauseated  Preventive Screening-Counseling & Management  Alcohol-Tobacco     Smoking Status: never  Current Medications (verified): 1)  Labetalol Hcl 200 Mg Tabs (Labetalol Hcl) .... 2 Two Times A Day 2)  Bayer Low Strength 81 Mg Tbec (Aspirin) .... Take 1 Tablet By Mouth Once A Day 3)  Voltaren 1 %  Gel (Diclofenac Sodium) .... Two Times A Day  To Qid As Needed 4)  Felodipine 5 Mg Xr24h-Tab (Felodipine) .... Take 1 Tablet By Mouth Once A Day 5)  Claritin 10 Mg Tabs (Loratadine) .... Once Daily 6)  Hydrocodone-Acetaminophen 10-325 Mg Tabs (Hydrocodone-Acetaminophen) .Marland Kitchen.. 1 By Mouth Up To 4 Time Per Day As Needed For Pain 7)  Flomax 0.4 Mg Cp24 (Tamsulosin Hcl) .... Take 1 Capsule By Mouth Nightly 8)  Cyclobenzaprine Hcl 10 Mg Tabs (Cyclobenzaprine Hcl) .Marland Kitchen.. 1 By Mouth Three Times A Day As Needed Spasm in Back 9)  Diclofenac Sodium 75 Mg Tbec (Diclofenac Sodium) .Marland Kitchen.. 1 By Mouth Two Times A Day Pc As Needed For Pain 10)  Vitamin D3 1000 Unit  Tabs (Cholecalciferol) .Marland Kitchen.. 1 By Mouth Daily 11)  Benicar 20 Mg Tabs (Olmesartan Medoxomil) .Marland Kitchen.. 1 Tablet By Mouth Daily  Allergies: 1)  ! Valium 2)  ! Cardura 3)  ! Norvasc 4)  * Altace 5)  * Lisinopril  Past History:  Past Medical History: Last updated: 11/13/2008 Hypertension Osteoarthritis Benign prostatic hypertrophy  Dr Brunilda Payor Cerebrovascular accident, hx of Prostate cancer, hx of 2009 Colon 2008 Dr Laurice Record D def 2010 Low back pain 2010  Past Surgical  History: Last updated: 05/02/2008 Denies surgical history XRT seeds 2009  Social History: Last updated: 05/02/2008 Retired from [employer] Married 2 ch Never Smoked Alcohol use-no Drug use-no Regular exercise-yes  Family History: Family History Diabetes 1st degree relative Family History Hypertension Family History of Cardiovascular disorder No GS   Impression & Recommendations:  Problem # 1:  ABDOMINAL PAIN, EPIGASTRIC (ICD-789.06) ? etiol Assessment New  Orders: Radiology Referral (Radiology) TLB-BMP (Basic Metabolic Panel-BMET) (80048-METABOL) TLB-Hepatic/Liver Function Pnl (80076-HEPATIC) TLB-CBC Platelet - w/Differential (85025-CBCD) TLB-Lipase (83690-LIPASE) TLB-Udip ONLY (81003-UDIP)  Problem # 2:  LEG PAIN (ICD-729.5) resolved Assessment: Comment Only  Problem # 3:  LOW BACK PAIN (ICD-724.2) resolved Assessment: Comment Only  The following medications were removed from the medication list:    Hydrocodone-acetaminophen 10-325 Mg Tabs (Hydrocodone-acetaminophen) .Marland Kitchen... 1 by mouth up to 4 time per day as needed for pain    Cyclobenzaprine Hcl 10 Mg Tabs (Cyclobenzaprine hcl) .Marland Kitchen... 1 by mouth three times a day as needed spasm in back    Diclofenac Sodium 75 Mg Tbec (Diclofenac sodium) .Marland Kitchen... 1 by mouth two times a day pc as needed for pain His updated medication list for this problem includes:    Bayer Low Strength 81 Mg Tbec (Aspirin) .Marland Kitchen... Take 1 tablet by mouth once a day  Problem # 4:  HYPERTENSION (ICD-401.9) Assessment: Unchanged  His updated medication list for this problem includes:    Labetalol Hcl 200 Mg Tabs (Labetalol hcl) .Marland Kitchen... 2 two times a day    Felodipine 5 Mg Xr24h-tab (Felodipine) .Marland Kitchen... Take 1 tablet by mouth once a day    Benicar 20 Mg Tabs (Olmesartan medoxomil) .Marland Kitchen... 1 tablet by mouth daily  BP today: 156/84 Prior BP: 160/80 (03/20/2009)  Prior 10 Yr Risk Heart Disease: Not enough information (01/26/2008)  Labs Reviewed: K+: 4.3  (03/13/2009) Creat: : 1.2 (03/13/2009)   Chol: 177 (05/02/2008)   HDL: 40.1 (05/02/2008)   LDL: 115 (05/02/2008)   TG: 111 (05/02/2008)  Complete Medication List: 1)  Labetalol Hcl 200 Mg Tabs (Labetalol hcl) .... 2 two times a day 2)  Bayer Low Strength 81 Mg Tbec (Aspirin) .... Take 1 tablet by mouth once a day 3)  Voltaren 1 % Gel (Diclofenac sodium) .... Two times a day  to qid as needed 4)  Felodipine 5 Mg Xr24h-tab (Felodipine) .... Take 1 tablet by mouth once a day 5)  Claritin 10 Mg Tabs (Loratadine) .... Once daily 6)  Flomax 0.4 Mg Cp24 (Tamsulosin hcl) .... Take 1 capsule by mouth nightly 7)  Vitamin D3 1000 Unit Tabs (Cholecalciferol) .Marland Kitchen.. 1 by mouth daily 8)  Benicar 20 Mg Tabs (Olmesartan medoxomil) .Marland Kitchen.. 1 tablet by mouth daily 9)  Aciphex 20 Mg Tbec (Rabeprazole sodium) .Marland Kitchen.. 1 by mouth qd  Patient Instructions: 1)  Please schedule a follow-up appointment in 2 weeks. 2)  Call if you are not better in a reasonable amount of time or if worse. Go to ER if feeling really bad!  Prescriptions: ACIPHEX 20 MG TBEC (RABEPRAZOLE SODIUM) 1 by mouth qd  #30 x 6   Entered and Authorized by:   Tresa Garter MD   Signed by:   Tresa Garter MD on 05/16/2009   Method used:   Print then Give to Patient   RxID:   [id number]   Appended Document: 2 mos f/u /#/cd PE: NAD CV RRR Lungs CTA Abd Sensitive in RUQ, no HSM, no rebound Skin clear LE w/o edema B Neuro CN2-12 nl HEENT moist a/o/c

## 2010-04-21 NOTE — Consult Note (Signed)
Summary: Berline Lopes MD  Berline Lopes MD   Imported By: Lester Haleburg 11/04/2009 09:21:23  _____________________________________________________________________  External Attachment:    Type:   Image     Comment:   External Document

## 2010-04-21 NOTE — Progress Notes (Signed)
  Phone Note Call from Patient Call back at [facility] Phone [phone]   Summary of Call: Pt was unable to find oragel mouthwash and wants to know if there are any alternatives?  Initial call taken by: Lamar Sprinkles, CMA,  January 08, 2010 10:54 AM  Follow-up for Phone Call        Oragel is a small tube with a gel, not a mouthwash. Pls ask the pharmacist Follow-up by: Tresa Garter MD,  January 11, 2010 11:21 AM  Additional Follow-up for Phone Call Additional follow up Details #1::        Pt informed  Additional Follow-up by: Lamar Sprinkles, CMA,  January 12, 2010 12:31 PM    Prescriptions: ACIPHEX 20 MG TBEC (RABEPRAZOLE SODIUM) 1 by mouth once daily prn  #30 x 6   Entered by:   Lamar Sprinkles, CMA   Authorized by:   Tresa Garter MD   Signed by:   Lamar Sprinkles, CMA on 01/08/2010   Method used:   Electronically to        [facility] [phone]* (retail)       [address]       [place], [place]  [address]       Ph: [phone] or [phone]       Fax: [phone]   RxID:   [id number]

## 2010-04-21 NOTE — Assessment & Plan Note (Signed)
Summary: 2 WK FU---STC   Vital Signs:  Patient profile:   69 year old male Weight:      228 pounds Temp:     9.69 degrees F oral Pulse rate:   69 / minute BP sitting:   154 / 72  (left arm)  Vitals Entered By: Tora Perches (May 28, 2009 3:55 PM) CC: f/u Is Patient Diabetic? No   Primary Care Provider:  Tresa Garter MD  CC:  f/u.  History of Present Illness: F/u abd pain - resolved in 1 d  Preventive Screening-Counseling & Management  Alcohol-Tobacco     Smoking Status: never  Current Medications (verified): 1)  Labetalol Hcl 200 Mg Tabs (Labetalol Hcl) .... 2 Two Times A Day 2)  Bayer Low Strength 81 Mg Tbec (Aspirin) .... Take 1 Tablet By Mouth Once A Day 3)  Voltaren 1 %  Gel (Diclofenac Sodium) .... Two Times A Day  To Qid As Needed 4)  Felodipine 5 Mg Xr24h-Tab (Felodipine) .... Take 1 Tablet By Mouth Once A Day 5)  Claritin 10 Mg Tabs (Loratadine) .... Once Daily 6)  Flomax 0.4 Mg Cp24 (Tamsulosin Hcl) .... Take 1 Capsule By Mouth Nightly 7)  Vitamin D3 1000 Unit  Tabs (Cholecalciferol) .Marland Kitchen.. 1 By Mouth Daily 8)  Benicar 20 Mg Tabs (Olmesartan Medoxomil) .Marland Kitchen.. 1 Tablet By Mouth Daily 9)  Aciphex 20 Mg Tbec (Rabeprazole Sodium) .Marland Kitchen.. 1 By Mouth Qd  Allergies: 1)  ! Valium 2)  ! Cardura 3)  ! Norvasc 4)  * Altace 5)  * Lisinopril  Physical Exam  General:  NAD Mouth:  Oral mucosa and oropharynx without lesions or exudates.  Teeth in good repair. Lungs:  Normal respiratory effort, chest expands symmetrically. Lungs are clear to auscultation, no crackles or wheezes. Heart:  Normal rate and regular rhythm. S1 and S2 normal without gallop, murmur, click, rub or other extra sounds. Abdomen:  Bowel sounds positive,abdomen soft and non-tender without masses, organomegaly or hernias noted. Msk:  Lumbar-sacral spine is not tender to palpation over paraspinal muscles and painfull with the ROM  Neurologic:  No cranial nerve deficits noted. Station and gait are  normal. Plantar reflexes are down-going bilaterally. DTRs are symmetrical throughout. Sensory, motor and coordinative functions appear intact.   Impression & Recommendations:  Problem # 1:  ABDOMINAL PAIN, EPIGASTRIC (ICD-789.06) resolved Assessment Improved Labs/US nl  Problem # 2:  Small renal cysts  Problem # 3:  LOW BACK PAIN (ICD-724.2) - gone Assessment: Improved  His updated medication list for this problem includes:    Bayer Low Strength 81 Mg Tbec (Aspirin) .Marland Kitchen... Take 1 tablet by mouth once a day  Problem # 4:  HYPERTENSION (ICD-401.9) Assessment: Unchanged  His updated medication list for this problem includes:    Labetalol Hcl 200 Mg Tabs (Labetalol hcl) .Marland Kitchen... 2 two times a day    Felodipine 5 Mg Xr24h-tab (Felodipine) .Marland Kitchen... Take 1 tablet by mouth once a day    Benicar 20 Mg Tabs (Olmesartan medoxomil) .Marland Kitchen... 1 tablet by mouth daily  BP today: 154/72 Prior BP: 156/84 (05/16/2009)  Prior 10 Yr Risk Heart Disease: Not enough information (01/26/2008)  Labs Reviewed: K+: 3.8 (05/16/2009) Creat: : 1.2 (05/16/2009)   Chol: 177 (05/02/2008)   HDL: 40.1 (05/02/2008)   LDL: 115 (05/02/2008)   TG: 111 (05/02/2008)  Complete Medication List: 1)  Labetalol Hcl 200 Mg Tabs (Labetalol hcl) .... 2 two times a day 2)  Bayer Low Strength 81 Mg Tbec (  Aspirin) .... Take 1 tablet by mouth once a day 3)  Voltaren 1 % Gel (Diclofenac sodium) .... Two times a day  to qid as needed 4)  Felodipine 5 Mg Xr24h-tab (Felodipine) .... Take 1 tablet by mouth once a day 5)  Claritin 10 Mg Tabs (Loratadine) .... Once daily 6)  Flomax 0.4 Mg Cp24 (Tamsulosin hcl) .... Take 1 capsule by mouth nightly 7)  Vitamin D3 1000 Unit Tabs (Cholecalciferol) .Marland Kitchen.. 1 by mouth daily 8)  Benicar 20 Mg Tabs (Olmesartan medoxomil) .Marland Kitchen.. 1 tablet by mouth daily 9)  Aciphex 20 Mg Tbec (Rabeprazole sodium) .Marland Kitchen.. 1 by mouth qd  Patient Instructions: 1)  Please schedule a follow-up appointment in 4 months. 2)  BMP  prior to visit, ICD-9: 3)  Hepatic Panel prior to visit, ICD-9:789.00 4)  HbgA1C prior to visit, ICD-9:

## 2010-04-22 ENCOUNTER — Encounter: Payer: Self-pay | Admitting: Internal Medicine

## 2010-04-23 ENCOUNTER — Encounter (INDEPENDENT_AMBULATORY_CARE_PROVIDER_SITE_OTHER): Payer: MEDICARE

## 2010-04-23 ENCOUNTER — Encounter: Payer: Self-pay | Admitting: Internal Medicine

## 2010-04-23 DIAGNOSIS — I6529 Occlusion and stenosis of unspecified carotid artery: Secondary | ICD-10-CM

## 2010-04-23 NOTE — Assessment & Plan Note (Signed)
Summary: 6 mos wellness/will come fasting/#/cd   Vital Signs:  Patient profile:   70 year old male Height:      76 inches Weight:      236 pounds BMI:     28.83 Temp:     98.4 degrees F oral Pulse rate:   76 / minute Pulse rhythm:   regular Resp:     16 per minute BP sitting:   160 / 90  (left arm) Cuff size:   regular  Vitals Entered By: Lanier Prude, CMA(AAMA) (April 16, 2010 8:37 AM) CC: MWV Is Patient Diabetic? No   Primary Care Provider:  Tresa Garter MD  CC:  MWV.  History of Present Illness: The patient presents for a preventive health examination  The Patient past medical history, social history, and family history reviewed in detail no significant changes.  Patient is physically active. Depression is negative and mood is good. Hearing is normal, and able to perform activities of daily living. Risk of falling is negligible and home safety has been reviewed and is appropriate. Patient has normal height, weight, and visual acuityl w/glasses. Patient has been counseled on age-appropriate routine health concerns for screening and prevention. Education, counseling done.  Cognition is nl  C/o occasional problem with soreness when swallowing on R side of the throat x years (since 1998). He had a Ba test back then. It may bother him 1-2 times per month C/o beeing sleepy after meals x months   Preventive Screening-Counseling & Management  Alcohol-Tobacco     Alcohol drinks/day: 0     Smoking Status: never  Caffeine-Diet-Exercise     Caffeine use/day: 1     Diet Counseling: to improve diet; diet is suboptimal     Does Patient Exercise: yes     Type of exercise: walking,stretching     Times/week: 1     Exercise Counseling: not indicated; exercise is adequate     Depression Counseling: not indicated; screening negative for depression  Hep-HIV-STD-Contraception     Hepatitis Risk: no risk noted     Dental Visit-last 6 months no     TSE monthly: no     Sun  Exposure-Excessive: no  Safety-Violence-Falls     Seat Belt Use: yes     Helmet Use: n/a     Firearms in the Home: no firearms in the home     Smoke Detectors: yes     Violence in the Home: no risk noted     Sexual Abuse: no     Fall Risk: none      Sexual History:  currently monogamous.    Current Medications (verified): 1)  Labetalol Hcl 200 Mg Tabs (Labetalol Hcl) .... 2 Two Times A Day 2)  Bayer Low Strength 81 Mg Tbec (Aspirin) .... Take 1 Tablet By Mouth Once A Day 3)  Felodipine 5 Mg Xr24h-Tab (Felodipine) .... Take 1 Tablet By Mouth Once A Day 4)  Claritin 10 Mg Tabs (Loratadine) .... Once Daily 5)  Flomax 0.4 Mg Cp24 (Tamsulosin Hcl) .... Take 1 Capsule By Mouth Nightly 6)  Vitamin D3 1000 Unit  Tabs (Cholecalciferol) .Marland Kitchen.. 1 By Mouth Daily 7)  Hydrocodone-Acetaminophen 10-325 Mg Tabs (Hydrocodone-Acetaminophen) .Marland Kitchen.. 1 By Mouth Two Times A Day By Mouth Bid 8)  Acyclovir 400 Mg Tabs (Acyclovir) .Marland Kitchen.. 1 By Mouth Three Times A Day As Needed Herpes X 7 D 9)  [place] Mouthwash .Marland Kitchen.. 10 Ml - Swish, Hold and Swallow Qid 10)  Oragesic  Soln (Mouthwashes)  11)  Cozaar 100 Mg Tabs (Losartan Potassium) .Marland Kitchen.. 1 By Mouth Qd 12)  Clobetasol Propionate 0.05 % Crea (Clobetasol Propionate) .... Use Bid 13)  Oragesic  Soln (Mouthwashes) .... As Dirr 14)  Nexium 40 Mg Cpdr (Esomeprazole Magnesium) .Marland Kitchen.. 1 By Mouth Qam ( Medically Necessary)  Allergies (verified): 1)  ! Valium 2)  ! Cardura 3)  ! Norvasc 4)  * Altace 5)  * Lisinopril  Past History:  Past Medical History: Hypertension Osteoarthritis Benign prostatic hypertrophy  Dr Brunilda Payor Cerebrovascular accident, hx of 1998 Prostate cancer, hx of 2009 Colon 2008 Dr Laurice Record D def 2010 Low back pain 2010 Gout  Social History: Caffeine use/day:  1 Does Patient Exercise:  yes Dental Care w/in 6 mos.:  no Sun Exposure-Excessive:  no Seat Belt Use:  yes Fall Risk:  none Hepatitis Risk:  no risk noted Sexual History:  currently  monogamous  Review of Systems  The patient denies anorexia, fever, weight loss, weight gain, vision loss, decreased hearing, hoarseness, chest pain, syncope, dyspnea on exertion, peripheral edema, prolonged cough, headaches, hemoptysis, abdominal pain, melena, hematochezia, severe indigestion/heartburn, hematuria, incontinence, genital sores, muscle weakness, suspicious skin lesions, transient blindness, difficulty walking, depression, unusual weight change, abnormal bleeding, enlarged lymph nodes, angioedema, and testicular masses.         No LBP  Physical Exam  General:  NAD Head:  Normocephalic and atraumatic without obvious abnormalities. No apparent alopecia or balding. Eyes:  No corneal or conjunctival inflammation noted. EOMI. Perrla. Ears:  soft wax Nose:  External nasal examination shows no deformity or inflammation. Nasal mucosa are pink and moist without lesions or exudates. Mouth:  1.4x0.6 cm clustr of erosions to th L from centr on hard palet Neck:  No deformities, masses, or tenderness noted. Lungs:  Normal respiratory effort, chest expands symmetrically. Lungs are clear to auscultation, no crackles or wheezes. Heart:  Normal rate and regular rhythm. S1 and S2 normal without gallop, murmur, click, rub or other extra sounds. Abdomen:  Bowel sounds positive,abdomen soft and non-tender without masses, organomegaly or hernias noted. Rectal:  per Dr Brunilda Payor Prostate:  per Dr Brunilda Payor Msk:  WNL Pulses:  R and L carotid,radial,femoral,dorsalis pedis and posterior tibial pulses are full and equal bilaterally Extremities:  No clubbing, cyanosis, edema, or deformity noted with normal full range of motion of all joints.   Neurologic:  No cranial nerve deficits noted. Station and gait are normal. Plantar reflexes are down-going bilaterally. DTRs are symmetrical throughout. Sensory, motor and coordinative functions appear intact. Skin:  Clear Cervical Nodes:  No lymphadenopathy noted Psych:   Cognition and judgment appear intact. Alert and cooperative with normal attention span and concentration. No apparent delusions, illusions, hallucinations   Impression & Recommendations:  Problem # 1:  HEALTH MAINTENANCE EXAM (ICD-V70.0) Assessment New  Overall doing well, age appropriate education and counseling updated and referral for appropriate preventive services done unless declined, immunizations up to date or declined, diet counseling done if overweight, urged to quit smoking if smokes, most recent labs reviewed and current ordered if appropriate, ecg reviewed or declined (interpretation per ECG scanned in the EMR if done); information regarding Medicare Preventation requirements given if appropriate.  I have personally reviewed the Medicare Annual Wellness questionnaire and have noted 1.   The patient's medical and social history 2.   Their use of alcohol, tobacco or illicit drugs 3.   Their current medications and supplements 4.   The patient's functional ability including ADL's, fall risks, home safety  risks and hearing or visual             impairment. 5.   Diet and physical activities 6.   Evidence for depression or mood disorders  The patients weight, height, BMI and visual acuity have been recorded in the chart I have made referrals, counseling and provided education to the patient based review of the above and I have provided the pt with a written personalized care plan for preventive services.  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 1 by mouth two times a day by mouth bid His updated medication list for this problem includes:    Bayer Low Strength 81 Mg Tbec (Aspirin) .Marland Kitchen... Take 1 tablet by mouth once a day  Problem # 5:  CERUMEN IMPACTION (ICD-380.4) B Assessment: New Procedure: ear irrigation Reason: wax impaction B Risks/benefis were discussed. Both ears were irrigated with warm water. Large ammount of wax was recovered. Instrumentation with metal ear loop was performed to accomplish the removal. Tolerated well Complications: none    Complete Medication List: 1)  Cozaar 100 Mg Tabs (Losartan potassium) .Marland Kitchen.. 1 by mouth qd 2)  Labetalol Hcl 200 Mg Tabs (Labetalol hcl) .... 2 two times a day 3)  Claritin 10 Mg Tabs (Loratadine) .... Once daily 4)  Flomax 0.4 Mg Cp24 (Tamsulosin hcl) .... Take 1 capsule by mouth nightly 5)  Vitamin D3 1000 Unit Tabs (Cholecalciferol) .Marland Kitchen.. 1 by mouth daily 6)  Clobetasol Propionate 0.05 % Crea (Clobetasol propionate) .... Use bid 7)  Oragesic Soln (Mouthwashes) .... As dirr 8)  Nexium 40 Mg Cpdr (Esomeprazole magnesium) .Marland Kitchen.. 1 by mouth qam ( medically necessary) 9)  Felodipine 10 Mg Xr24h-tab (Felodipine) .Marland Kitchen.. 1 by mouth qd 10)  Bayer Low Strength 81 Mg Tbec (Aspirin) .... Take 1 tablet by mouth once a day  Other Orders: Cerumen Impaction Removal (57846)  Patient Instructions: 1)  Please schedule a follow-up appointment in 6 months. Prescriptions: NEXIUM 40 MG CPDR (ESOMEPRAZOLE MAGNESIUM) 1 by mouth qam ( medically necessary)  #30 x 11   Entered and Authorized by:   Tresa Garter MD   Signed by:    Tresa Garter MD on 04/16/2010   Method used:   Electronically to        [facility] [phone]* (retail)       [address]       [place], [place]  [address]       Ph: [phone] or [phone]       Fax: [phone]   RxID:   [id number] FLOMAX 0.4 MG CP24 (TAMSULOSIN HCL) Take 1 capsule by mouth nightly  #30 x 11   Entered and Authorized by:   Tresa Garter MD   Signed by:   Tresa Garter MD on 04/16/2010   Method  used:   Electronically to        [facility] [phone]* (retail)       [address]       [place], [place]  [address]       Ph: [phone] or [phone]       Fax: [phone]   RxID:   [id number] CLARITIN 10 MG TABS (LORATADINE) once daily  #30 x 11   Entered and Authorized by:   Tresa Garter MD   Signed by:   Tresa Garter MD on 04/16/2010   Method used:   Electronically to        [facility] [address] [phone]* (retail)       [address]       [place], [place]  [address]       Ph: [phone] or [phone]       Fax: [phone]   RxID:   [id number] LABETALOL HCL 200 MG TABS (LABETALOL HCL) 2 two times a day  #120 Tablet x 11   Entered and Authorized by:   Tresa Garter MD   Signed by:   Tresa Garter MD on 04/16/2010   Method used:   Electronically to        [facility] [phone]* (retail)       [address]       [place], [place]  [address]       Ph: [phone] or [phone]       Fax: [phone]   RxID:   [id number] COZAAR 100 MG TABS (LOSARTAN POTASSIUM) 1 by mouth qd  #30 x 11   Entered and Authorized by:   Tresa Garter MD   Signed by:   Tresa Garter MD on 04/16/2010   Method used:   Electronically to        [facility] [phone]* (retail)       [address]       [place], [place]  [address]        Ph: [phone] or [phone]       Fax: [phone]   RxID:   [id number] FELODIPINE 10 MG XR24H-TAB (FELODIPINE) 1 by mouth qd  #30 x 11   Entered and Authorized by:   Tresa Garter MD   Signed by:   Tresa Garter MD on 04/16/2010   Method used:   Electronically to        [facility] [phone]* (retail)       [address]       [place], [place]  [address]       Ph: [phone] or [phone]       Fax: [phone]   RxID:   [id number]    Orders Added: 1)  Cerumen Impaction Removal [69210] 2)  Medicare -1st Annual Wellness Visit [G0438] 3)  Est. Patient Level IV [02774] 4)  TLB-BMP (Basic Metabolic Panel-BMET) [80048-METABOL] 5)  TLB-CBC Platelet - w/Differential [85025-CBCD] 6)  TLB-Hepatic/Liver Function Pnl [80076-HEPATIC] 7)  TLB-Lipid Panel [80061-LIPID] 8)  TLB-TSH (Thyroid Stimulating Hormone) [84443-TSH] 9)  TLB-Udip ONLY [81003-UDIP] 10)  TLB-PSA (Prostate Specific Antigen) [12878-MVE] 11)  Doppler Referral [Doppler]

## 2010-04-27 ENCOUNTER — Telehealth: Payer: Self-pay | Admitting: Internal Medicine

## 2010-04-29 NOTE — Miscellaneous (Signed)
Summary: Orders Update  Clinical Lists Changes  Orders: Added new Test order of Carotid Duplex (Carotid Duplex) - Signed 

## 2010-05-07 NOTE — Progress Notes (Signed)
Summary: ALT MED  Phone Note Call from Patient Call back at Home Phone [phone]   Summary of Call: Felodipine has increased from 6 dollars to 45 a mth - req cheaper alt.  Initial call taken by: Lamar Sprinkles, CMA,  April 27, 2010 9:24 AM  Follow-up for Phone Call        ok to d/c Start Calan SR 240 once daily  Pls inform - carotid US was nl Follow-up by: Tresa Garter MD,  April 28, 2010 8:06 AM  Additional Follow-up for Phone Call Additional follow up Details #1::        Patient wife notified.Alvy Beal Archie CMA  April 28, 2010 11:26 AM     New/Updated Medications: CALAN SR 240 MG CR-TABS (VERAPAMIL HCL) 1 by mouth qd Prescriptions: CALAN SR 240 MG CR-TABS (VERAPAMIL HCL) 1 by mouth qd  #30 x 11   Entered by:   Rock Nephew CMA   Authorized by:   Tresa Garter MD   Signed by:   Rock Nephew CMA on 04/28/2010   Method used:   Electronically to        [facility] [phone]* (retail)       [address]       [place], [place]  [address]       Ph: [phone] or [phone]       Fax: [phone]   RxID:   [id number]

## 2010-05-13 ENCOUNTER — Telehealth: Payer: Self-pay | Admitting: Internal Medicine

## 2010-05-19 NOTE — Progress Notes (Signed)
Summary: RX?   Phone Note Call from Patient Call back at [facility] Phone [phone]   Caller: ---[phone] Call For: Dr Romito Rea Summary of Call: Pt has very painful hemorroids, pt would like office visit today w/Dr Masai Kidd, please advise Initial call taken by: Verdell Face,  May 13, 2010 10:06 AM  Follow-up for Phone Call        Appt with another MD pls - I'm overbooked Thank you!  Follow-up by: Tresa Garter MD,  May 13, 2010 1:28 PM  Additional Follow-up for Phone Call Additional follow up Details #1::        There are no openings, can this wait till tomorrow? 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 Additional Follow-up by: Verdell Face,  May 13, 2010 3:03 PM    Additional Follow-up for Phone Call Additional follow up Details #2::    Spoke w/pt - no rectal bleeding or itching only discomfort. Patient is requesting rx for suppositories from MD. He has tried otc products w/no relief..................Marland KitchenLamar Sprinkles, CMA  May 13, 2010 3:12 PM   Additional Follow-up for Phone Call Additional follow up Details #3:: Details for Additional Follow-up Action Taken: OK Anusol HC OV next wk  Pt informed..............Marland KitchenLamar Sprinkles, CMA  May 14, 2010 9:29 AM  Additional Follow-up by: Tresa Garter MD,  May 13, 2010 6:12 PM  New/Updated Medications: ANUSOL-HC 25 MG SUPP (HYDROCORTISONE ACETATE) 1 pr two times a day for hemorrhoids Prescriptions: ANUSOL-HC 25 MG SUPP (HYDROCORTISONE ACETATE) 1 pr two times a day for hemorrhoids  #20 x 3   Entered and Authorized by:   Tresa Garter MD   Signed by:   Lamar Sprinkles, CMA on 05/14/2010   Method used:   Electronically to        [facility] [phone]* (retail)       [address]       [place], [place]  [address]       Ph: [phone] or [phone]       Fax: [phone]   RxID:   [id number]

## 2010-05-26 ENCOUNTER — Telehealth: Payer: Self-pay | Admitting: Internal Medicine

## 2010-05-27 ENCOUNTER — Ambulatory Visit (INDEPENDENT_AMBULATORY_CARE_PROVIDER_SITE_OTHER): Payer: MEDICARE | Admitting: Internal Medicine

## 2010-05-27 ENCOUNTER — Encounter: Payer: Self-pay | Admitting: Internal Medicine

## 2010-05-27 DIAGNOSIS — I10 Essential (primary) hypertension: Secondary | ICD-10-CM

## 2010-06-02 ENCOUNTER — Other Ambulatory Visit: Payer: Self-pay | Admitting: Internal Medicine

## 2010-06-02 ENCOUNTER — Ambulatory Visit (INDEPENDENT_AMBULATORY_CARE_PROVIDER_SITE_OTHER): Payer: MEDICARE | Admitting: Internal Medicine

## 2010-06-02 ENCOUNTER — Encounter: Payer: Self-pay | Admitting: Internal Medicine

## 2010-06-02 ENCOUNTER — Ambulatory Visit (INDEPENDENT_AMBULATORY_CARE_PROVIDER_SITE_OTHER)
Admission: RE | Admit: 2010-06-02 | Discharge: 2010-06-02 | Disposition: A | Discharge status: Home / Self Care | Payer: MEDICARE | Source: Ambulatory Visit | Attending: Internal Medicine | Admitting: Internal Medicine

## 2010-06-02 DIAGNOSIS — I10 Essential (primary) hypertension: Secondary | ICD-10-CM

## 2010-06-02 DIAGNOSIS — M79609 Pain in unspecified limb: Secondary | ICD-10-CM

## 2010-06-02 NOTE — Assessment & Plan Note (Signed)
Summary: PT IS ELEVATED / 205/90, 191/92 /NWS   Vital Signs:  Patient profile:   70 year old male O2 Sat:      97 % on Room air Temp:     98.4 degrees F (36.89 degrees C) oral Pulse rate:   83 / minute BP sitting:   180 / 90  (left arm) Cuff size:   large  O2 Flow:  Room air CC: Elevated BP Is Patient Diabetic? No Pain Assessment Patient in pain? no        Primary Care Provider:  Georgina Quint Plotnikov MD  CC:  Elevated BP.  History of Present Illness: here for uncontrolled BP - not taking calan due to ha se did well on felodipine until cost prohibitive has not taken cozaar - did not pick up refill after running our 1st bottle does not recall taking norvasc/amlodipine but will try again because he liked felodipine no CP, no weakness +dizzy symptoms yesterday - improved now - denies eye/vision change   Clinical Review Panels:  Immunizations   Last Tetanus Booster:  Td (03/20/2009)   Last Flu Vaccine:  Fluvax 3+ (12/18/2009)   Last Pneumovax:  Pneumovax (05/02/2008)  Lipid Management   Cholesterol:  186 (04/16/2010)   LDL (bad choesterol):  128 (04/16/2010)   HDL (good cholesterol):  40.70 (04/16/2010)  Diabetes Management   HgBA1C:  5.1 (09/24/2009)   Creatinine:  1.1 (04/16/2010)   Last Flu Vaccine:  Fluvax 3+ (12/18/2009)   Last Pneumovax:  Pneumovax (05/02/2008)  CBC   WBC:  4.5 (04/16/2010)   RBC:  4.21 (04/16/2010)   Hgb:  12.0 (04/16/2010)   Hct:  35.9 (04/16/2010)   Platelets:  167.0 (04/16/2010)   MCV  85.3 (04/16/2010)   MCHC  33.3 (04/16/2010)   RDW  12.9 (04/16/2010)   PMN:  52.0 (04/16/2010)   Lymphs:  35.0 (04/16/2010)   Monos:  8.0 (04/16/2010)   Eosinophils:  4.1 (04/16/2010)   Basophil:  0.9 (04/16/2010)  Complete Metabolic Panel   Glucose:  96 (04/16/2010)   Sodium:  140 (04/16/2010)   Potassium:  4.5 (04/16/2010)   Chloride:  103 (04/16/2010)   CO2:  29 (04/16/2010)   BUN:  17 (04/16/2010)   Creatinine:  1.1 (04/16/2010)  Albumin:  4.2 (04/16/2010)   Total Protein:  6.9 (04/16/2010)   Calcium:  9.6 (04/16/2010)   Total Bili:  0.9 (04/16/2010)   Alk Phos:  85 (04/16/2010)   SGPT (ALT):  31 (04/16/2010)   SGOT (AST):  26 (04/16/2010)   Current Medications (verified): 1)  Cozaar 100 Mg Tabs (Losartan Potassium) .Marland Kitchen.. 1 By Mouth Qd 2)  Labetalol Hcl 200 Mg Tabs (Labetalol Hcl) .... 2 Two Times A Day 3)  Claritin 10 Mg Tabs (Loratadine) .... Once Daily 4)  Flomax 0.4 Mg Cp24 (Tamsulosin Hcl) .... Take 1 Capsule By Mouth Nightly 5)  Vitamin D3 1000 Unit  Tabs (Cholecalciferol) .Marland Kitchen.. 1 By Mouth Daily 6)  Clobetasol Propionate 0.05 % Crea (Clobetasol Propionate) .... Use Bid 7)  Oragesic  Soln (Mouthwashes) .... As Dirr 8)  Nexium 40 Mg Cpdr (Esomeprazole Magnesium) .Marland Kitchen.. 1 By Mouth Qam ( Medically Necessary) 9)  Bayer Low Strength 81 Mg Tbec (Aspirin) .... Take 1 Tablet By Mouth Once A Day 10)  Calan Sr 240 Mg Cr-Tabs (Verapamil Hcl) .Marland Kitchen.. 1 By Mouth Qd  Allergies (verified): 1)  ! Valium 2)  ! Cardura 3)  * Altace 4)  * Lisinopril  Past History:  Past Medical History: Hypertension  Osteoarthritis Benign prostatic hypertrophy  Dr Brunilda Payor Cerebrovascular accident, hx of 1998 Prostate cancer, hx of 2009 Colon 2008 Dr Laurice Record D def 2010 Low back pain 2010 Gout  Review of Systems  The patient denies vision loss, chest pain, syncope, muscle weakness, and difficulty walking.    Physical Exam  General:  alert, well-developed, well-nourished, and cooperative to examination.    Eyes:  vision grossly intact; pupils equal, round and reactive to light.  conjunctiva and lids normal.   wears glasses Lungs:  normal respiratory effort, no intercostal retractions or use of accessory muscles; normal breath sounds bilaterally - no crackles and no wheezes.    Heart:  normal rate, regular rhythm, no murmur, and no rub. BLE without edema.  Neurologic:  alert & oriented X3 and cranial nerves II-XII symetrically intact.   strength normal in all extremities, sensation intact to light touch, and gait normal. speech fluent without dysarthria or aphasia; follows commands with good comprehension.    Impression & Recommendations:  Problem # 1:  HYPERTENSION (ICD-401.9)  prior intol reviewed including SE and noncompliance will retry amlodipine as pt prefers felodipine but too expensive - new erx done also increase dose titration labetolol as pt tol this med w/o probs reminded to take arb as rx'd - pick up refills! f/u 2 weeks PCP, sooner if probs or call 911/ER if new neuro probs (hx CVA reviewed) His updated medication list for this problem includes:    Amlodipine Besylate 5 Mg Tabs (Amlodipine besylate) .Marland Kitchen... 1 by mouth once daily    Cozaar 100 Mg Tabs (Losartan potassium) .Marland Kitchen... 1 by mouth qd    Labetalol Hcl 200 Mg Tabs (Labetalol hcl) .Marland Kitchen... 2  by mouth three times a day  BP today: 180/90 Prior BP: 160/90 (04/16/2010)  Prior 10 Yr Risk Heart Disease: Not enough information (01/26/2008)  Labs Reviewed: K+: 4.5 (04/16/2010) Creat: : 1.1 (04/16/2010)   Chol: 186 (04/16/2010)   HDL: 40.70 (04/16/2010)   LDL: 128 (04/16/2010)   TG: 86.0 (04/16/2010)  Orders: Prescription Created Electronically [id number])  Complete Medication List: 1)  Amlodipine Besylate 5 Mg Tabs (Amlodipine besylate) .Marland Kitchen.. 1 by mouth once daily 2)  Cozaar 100 Mg Tabs (Losartan potassium) .Marland Kitchen.. 1 by mouth qd 3)  Labetalol Hcl 200 Mg Tabs (Labetalol hcl) .... 2  by mouth three times a day 4)  Claritin 10 Mg Tabs (Loratadine) .... Once daily 5)  Flomax 0.4 Mg Cp24 (Tamsulosin hcl) .... Take 1 capsule by mouth nightly 6)  Vitamin D3 1000 Unit Tabs (Cholecalciferol) .Marland Kitchen.. 1 by mouth daily 7)  Clobetasol Propionate 0.05 % Crea (Clobetasol propionate) .... Use bid 8)  Oragesic Soln (Mouthwashes) .... As dirr 9)  Nexium 40 Mg Cpdr (Esomeprazole magnesium) .Marland Kitchen.. 1 by mouth qam ( medically necessary) 10)  Bayer Low Strength 81 Mg Tbec (Aspirin) ....  Take 1 tablet by mouth once a day  Patient Instructions: 1)  it was good to see you today. 2)  stop verapamil due to headache 3)  start amlodipine in place of previous felodipine 4)  increase labetolol to 2 pill three times a day  5)  continue cozaar as per Dr. Bertoli Rea 6)  your prescriptions have been electronically submitted to your pharmacy. Please take as directed. Contact our office if you believe you're having problems with the medication(s).  7)  Please schedule a follow-up appointment in 2 weeks with Dr. Morriss Rea to recheck blood pressure, sooner if problems.  Prescriptions: AMLODIPINE BESYLATE 5 MG TABS (AMLODIPINE BESYLATE)  1 by mouth once daily  #30 x 3   Entered and Authorized by:   Newt Lukes MD   Signed by:   Newt Lukes MD on 05/27/2010   Method used:   Electronically to        [facility] [phone]* (retail)       [address]       [place], [place]  [address]       Ph: [phone] or [phone]       Fax: [phone]   RxID:   [id number]    Orders Added: 1)  Est. Patient Level IV [47425] 2)  Prescription Created Electronically [phone]

## 2010-06-02 NOTE — Progress Notes (Signed)
Summary: FYI  Phone Note Call from Patient   Caller: Patient Summary of Call: FYI: pt called stating he has been having dizziness, HA, elevated blood pressure.  He denies SOB, CP, neck pain, N&V, confusion and vision changes. He is scheduled tom at 11:30am with Dr. Felicity Coyer. I advised him to keep appt tom and go to nearest ER if his current symptoms worsen or if he developed any of the above symptoms. He understands/agrees. Initial call taken by: Lanier Prude, [facility]),  May 26, 2010 3:17 PM  Follow-up for Phone Call        agree Thank you!  Follow-up by: Tresa Garter MD,  May 27, 2010 11:00 AM

## 2010-06-09 NOTE — Assessment & Plan Note (Signed)
Summary: ?broken toe/plot pt/cd   Vital Signs:  Patient profile:   70 year old male Height:      76.5 inches Weight:      240.38 pounds BMI:     28.98 O2 Sat:      97 % on Room air Temp:     98.2 degrees F oral Pulse rate:   67 / minute BP sitting:   138 / 80  (left arm) Cuff size:   large  Vitals Entered By: Zella Ball Ewing CMA Duncan Dull) (June 02, 2010 3:34 PM)  O2 Flow:  Room air CC: Left foot little toe injured/RE   Primary Care Provider:  Georgina Quint Plotnikov MD  CC:  Left foot little toe injured/RE.  History of Present Illness: here with acute onset pain, redness, swelling to 5th toe left foot after strking the toe accidently on furniture rushing to turn off the alarm in the house he accidently tripped;  Pt denies CP, worsening sob, doe, wheezing, orthopnea, pnd, worsening LE edema, palps, dizziness or syncope  Pt denies new neuro symptoms such as headache, facial or extremity weakness  Pt denies polydipsia, polyuria.    Problems Prior to Update: 1)  Toe Pain  (ICD-729.5) 2)  Cerumen Impaction  (ICD-380.4) 3)  Dysphagia Unspecified  (ICD-787.20) 4)  Dysphagia Unspecified  (ICD-787.20) 5)  Health Maintenance Exam  (ICD-V70.0) 6)  Eczema  (ICD-692.9) 7)  Aphthous Ulcers  (ICD-528.2) 8)  Gout  (ICD-274.9) 9)  Hand Pain  (ICD-729.5) 10)  Abdominal Pain, Epigastric  (ICD-789.06) 11)  Burn, Second Degree, Hand  (ICD-944.20) 12)  Leg Pain  (ICD-729.5) 13)  Low Back Pain  (ICD-724.2) 14)  Vitamin D Deficiency  (ICD-268.9) 15)  Prostate Cancer, Hx of  (ICD-V10.46) 16)  Family History Diabetes 1st Degree Relative  (ICD-V18.0) 17)  Cerebrovascular Accident, Hx of  (ICD-V12.50) 18)  Benign Prostatic Hypertrophy  (ICD-600.00) 19)  Osteoarthritis  (ICD-715.90) 20)  Hypertension  (ICD-401.9) 21)  Essential Hypertension  (ICD-401.9) 22)  Other Wrist Sprain and Strain  (ICD-842.09)  Medications Prior to Update: 1)  Amlodipine Besylate 5 Mg Tabs (Amlodipine Besylate) .Marland Kitchen.. 1 By Mouth  Once Daily 2)  Cozaar 100 Mg Tabs (Losartan Potassium) .Marland Kitchen.. 1 By Mouth Qd 3)  Labetalol Hcl 200 Mg Tabs (Labetalol Hcl) .... 2  By Mouth Three Times A Day 4)  Claritin 10 Mg Tabs (Loratadine) .... Once Daily 5)  Flomax 0.4 Mg Cp24 (Tamsulosin Hcl) .... Take 1 Capsule By Mouth Nightly 6)  Vitamin D3 1000 Unit  Tabs (Cholecalciferol) .Marland Kitchen.. 1 By Mouth Daily 7)  Clobetasol Propionate 0.05 % Crea (Clobetasol Propionate) .... Use Bid 8)  Oragesic  Soln (Mouthwashes) .... As Dirr 9)  Nexium 40 Mg Cpdr (Esomeprazole Magnesium) .Marland Kitchen.. 1 By Mouth Qam ( Medically Necessary) 10)  Bayer Low Strength 81 Mg Tbec (Aspirin) .... Take 1 Tablet By Mouth Once A Day  Current Medications (verified): 1)  Amlodipine Besylate 5 Mg Tabs (Amlodipine Besylate) .Marland Kitchen.. 1 By Mouth Once Daily 2)  Cozaar 100 Mg Tabs (Losartan Potassium) .Marland Kitchen.. 1 By Mouth Qd 3)  Labetalol Hcl 200 Mg Tabs (Labetalol Hcl) .... 2  By Mouth Three Times A Day 4)  Claritin 10 Mg Tabs (Loratadine) .... Once Daily 5)  Flomax 0.4 Mg Cp24 (Tamsulosin Hcl) .... Take 1 Capsule By Mouth Nightly 6)  Vitamin D3 1000 Unit  Tabs (Cholecalciferol) .Marland Kitchen.. 1 By Mouth Daily 7)  Clobetasol Propionate 0.05 % Crea (Clobetasol Propionate) .... Use Bid 8)  Oragesic  Soln (Mouthwashes) .... As Dirr 9)  Nexium 40 Mg Cpdr (Esomeprazole Magnesium) .Marland Kitchen.. 1 By Mouth Qam ( Medically Necessary) 10)  Bayer Low Strength 81 Mg Tbec (Aspirin) .... Take 1 Tablet By Mouth Once A Day  Allergies (verified): 1)  ! Valium 2)  ! Cardura 3)  * Altace 4)  * Lisinopril  Past History:  Past Medical History: Last updated: 05/27/2010 Hypertension  Osteoarthritis Benign prostatic hypertrophy  Dr Brunilda Payor Cerebrovascular accident, hx of 1998 Prostate cancer, hx of 2009 Colon 2008 Dr Laurice Record D def 2010 Low back pain 2010 Gout  Past Surgical History: Last updated: 05/02/2008 Denies surgical history XRT seeds 2009  Social History: Last updated: 05/02/2008 Retired from  [employer] Married 2 ch Never Smoked Alcohol use-no Drug use-no Regular exercise-yes  Risk Factors: Alcohol Use: 0 (04/16/2010) Caffeine Use: 1 (04/16/2010) Exercise: yes (04/16/2010)  Risk Factors: Smoking Status: never (04/16/2010)  Review of Systems       all otherwise negative per pt -    Physical Exam  General:  alert, well-developed, well-nourished, and cooperative to examination.    Head:  Normocephalic and atraumatic without obvious abnormalities. No apparent alopecia or balding. Eyes:  vision grossly intact, pupils equal, and pupils round.   Ears:  soft wax Nose:  no external deformity and no nasal discharge.   Mouth:  no gingival abnormalities and pharynx pink and moist.   Neck:  supple and no masses.   Lungs:  normal respiratory effort and normal breath sounds.   Heart:  normal rate and regular rhythm.   Msk:  left 5th toe with 2-3+ red, tender , swelling diffusely without bleeding Extremities:  no edema, no erythema    Impression & Recommendations:  Problem # 1:  TOE PAIN (ICD-729.5) ? fracture toe, for film today, and buddy tape to help pain and make stable for healing Orders: T-Toe(s) (73660TC)  Problem # 2:  HYPERTENSION (ICD-401.9)  His updated medication list for this problem includes:    Amlodipine Besylate 5 Mg Tabs (Amlodipine besylate) .Marland Kitchen... 1 by mouth once daily    Cozaar 100 Mg Tabs (Losartan potassium) .Marland Kitchen... 1 by mouth qd    Labetalol Hcl 200 Mg Tabs (Labetalol hcl) .Marland Kitchen... 2  by mouth three times a day  BP today: 138/80 Prior BP: 180/90 (05/27/2010)  Prior 10 Yr Risk Heart Disease: Not enough information (01/26/2008)  Labs Reviewed: K+: 4.5 (04/16/2010) Creat: : 1.1 (04/16/2010)   Chol: 186 (04/16/2010)   HDL: 40.70 (04/16/2010)   LDL: 128 (04/16/2010)   TG: 86.0 (04/16/2010) stable overall by hx and exam, ok to continue meds/tx as is   Complete Medication List: 1)  Amlodipine Besylate 5 Mg Tabs (Amlodipine besylate) .Marland Kitchen.. 1 by mouth  once daily 2)  Cozaar 100 Mg Tabs (Losartan potassium) .Marland Kitchen.. 1 by mouth qd 3)  Labetalol Hcl 200 Mg Tabs (Labetalol hcl) .... 2  by mouth three times a day 4)  Claritin 10 Mg Tabs (Loratadine) .... Once daily 5)  Flomax 0.4 Mg Cp24 (Tamsulosin hcl) .... Take 1 capsule by mouth nightly 6)  Vitamin D3 1000 Unit Tabs (Cholecalciferol) .Marland Kitchen.. 1 by mouth daily 7)  Clobetasol Propionate 0.05 % Crea (Clobetasol propionate) .... Use bid 8)  Oragesic Soln (Mouthwashes) .... As dirr 9)  Nexium 40 Mg Cpdr (Esomeprazole magnesium) .Marland Kitchen.. 1 by mouth qam ( medically necessary) 10)  Bayer Low Strength 81 Mg Tbec (Aspirin) .... Take 1 tablet by mouth once a day  Patient Instructions: 1)  Please go  to Radiology in the basement level for your X-Ray today  2)  Continue all previous medications as before this visit  3)  Please call the number on the [facility] for results of your testing  4)  Please schedule a follow-up appointment as needed.   Orders Added: 1)  T-Toe(s) [73660TC] 2)  Est. Patient Level IV [16109]

## 2010-07-01 ENCOUNTER — Encounter: Payer: Self-pay | Admitting: Internal Medicine

## 2010-07-01 ENCOUNTER — Ambulatory Visit (INDEPENDENT_AMBULATORY_CARE_PROVIDER_SITE_OTHER): Payer: MEDICARE | Admitting: Internal Medicine

## 2010-07-01 DIAGNOSIS — I6523 Occlusion and stenosis of bilateral carotid arteries: Secondary | ICD-10-CM

## 2010-07-01 DIAGNOSIS — M545 Low back pain, unspecified: Secondary | ICD-10-CM

## 2010-07-01 DIAGNOSIS — R51 Headache: Secondary | ICD-10-CM

## 2010-07-01 DIAGNOSIS — K649 Unspecified hemorrhoids: Secondary | ICD-10-CM

## 2010-07-01 DIAGNOSIS — I6529 Occlusion and stenosis of unspecified carotid artery: Secondary | ICD-10-CM

## 2010-07-01 DIAGNOSIS — I10 Essential (primary) hypertension: Secondary | ICD-10-CM

## 2010-07-01 DIAGNOSIS — K5900 Constipation, unspecified: Secondary | ICD-10-CM

## 2010-07-01 DIAGNOSIS — K5909 Other constipation: Secondary | ICD-10-CM

## 2010-07-01 MED ORDER — SENNOSIDES-DOCUSATE SODIUM 8.6-50 MG PO TABS: 2.0000 | ORAL_TABLET | Freq: Every day | ORAL | Status: DC | PRN

## 2010-07-01 NOTE — Assessment & Plan Note (Signed)
Better - prn meds

## 2010-07-01 NOTE — Assessment & Plan Note (Signed)
US Carotid Duplex Bilateral Status:  Final result                     Study Result     Clinical Data: Stroke.  BILATERAL CAROTID DUPLEX DOPPLER ULTRASOUND:  Findings:  The following Doppler flow velocity measurements were obtained (in cm/sec):  SITE: PEAK SYSTOLIC END DIASTOLIC  RIGHT ICA: 91 21  RIGHT CCA: 136 15  RIGHT ICA/CCA RATIO: .67 1.36  RIGHT ECA: 113  LEFT ICA: 101 32  LEFT CCA: 191 28  LEFT ICA/CCA RATIO: .53 1.12  LEFT ECA: 94  Criteria: Quantification of carotid stenosis is based on velocity parameters that correlate the residual internal carotid diameter with NASCET-based stenosis levels.  There is minimal plaque in the right ICA bulb. Some calcified areas are noted. Doppler analysis demonstrates a low resistance waveform with sharp upstroke. Minimal spectral broadening is noted. The right vertebral artery is antegrade in flow with a normal appearing Doppler waveform. There is also minimal plaque in the left ICA bulb which is primarily soft. Doppler analysis demonstrates a low resistance waveform with a sharp upstroke. The left vertebral artery is antegrade in flow.  IMPRESSION:  Estimated stenosis in the right and left ICA's is 0 to 50% bilaterally.  Provider: Molly Maduro

## 2010-07-01 NOTE — Progress Notes (Signed)
Subjective:    Patient ID: Kurt Sexton, male    DOB: December 03, 1940, 70 y.o.   MRN: [id number]  HPI  The patient presents for a follow-up of  chronic hypertension, chronic dyslipidemia, LBP controlled with medicines. He had a bad HA from Cozaar - he stopped it. C/o R neck pain. C/o hemorrhoids and constipation - worse over past 3 months or so    Review of Systems  Constitutional: Negative for activity change, appetite change, fatigue and unexpected weight change.  HENT: Negative for nosebleeds, congestion, sore throat, rhinorrhea, sneezing, trouble swallowing, neck pain and neck stiffness.   Eyes: Negative for pain, itching and visual disturbance.  Respiratory: Negative for cough and wheezing.   Cardiovascular: Negative for chest pain, palpitations and leg swelling.  Gastrointestinal: Negative for nausea, abdominal pain, diarrhea, blood in stool and abdominal distention.  Genitourinary: Negative for urgency, frequency, hematuria, decreased urine volume and difficulty urinating.  Musculoskeletal: Negative for myalgias, back pain, joint swelling, arthralgias and gait problem.  Skin: Negative for rash.  Neurological: Negative for dizziness, tremors, speech difficulty and weakness.  Hematological: Negative for adenopathy. Does not bruise/bleed easily.  Psychiatric/Behavioral: Negative for suicidal ideas, behavioral problems, sleep disturbance, dysphoric mood and agitation. The patient is not nervous/anxious.        Objective:   Physical Exam  Constitutional: He is oriented to person, place, and time. He appears well-developed.  HENT:  Mouth/Throat: Oropharynx is clear and moist.  Eyes: Conjunctivae are normal. Pupils are equal, round, and reactive to light.  Neck: Normal range of motion. No JVD present. No thyromegaly present.  Cardiovascular: Normal rate, regular rhythm, normal heart sounds and intact distal pulses.  Exam reveals no gallop and no friction rub.   No murmur  heard. Pulmonary/Chest: Effort normal and breath sounds normal. No respiratory distress. He has no wheezes. He has no rales. He exhibits no tenderness.  Abdominal: Soft. Bowel sounds are normal. He exhibits no distension and no mass. There is no tenderness. There is no rebound and no guarding.  Musculoskeletal: Normal range of motion. He exhibits no edema and no tenderness.  Lymphadenopathy:    He has no cervical adenopathy.  Neurological: He is alert and oriented to person, place, and time. He has normal reflexes. No cranial nerve deficit. He exhibits normal muscle tone. Coordination normal.  Skin: Skin is warm and dry. No rash noted.  Psychiatric: He has a normal mood and affect. His behavior is normal. Judgment and thought content normal.        Lab Results  Component Value Date   WBC 4.5 04/16/2010   HGB 12.0* 04/16/2010   HGB NEGATIVE 04/16/2010   HCT 35.9* 04/16/2010   PLT 167.0 04/16/2010   CHOL 186 04/16/2010   TRIG 86.0 04/16/2010   HDL 40.70 04/16/2010   ALT 31 04/16/2010   AST 26 04/16/2010   NA 140 04/16/2010   K 4.5 04/16/2010   CL 103 04/16/2010   CREATININE 1.1 04/16/2010   BUN 17 04/16/2010   CO2 29 04/16/2010   TSH 1.34 04/16/2010   PSA 5.23* 04/16/2010   HGBA1C 5.1 09/24/2009     Assessment & Plan:  LOW BACK PAIN Better - prn meds  Unspecified Essential Hypertension BP is nl at home per pt. Will watch  Carotid stenosis, bilateral US Carotid Duplex Bilateral Status:  Final result                     Study Result  Clinical Data: Stroke.  BILATERAL CAROTID DUPLEX DOPPLER ULTRASOUND:  Findings:  The following Doppler flow velocity measurements were obtained (in cm/sec):  SITE: PEAK SYSTOLIC END DIASTOLIC  RIGHT ICA: 91 21  RIGHT CCA: 136 15  RIGHT ICA/CCA RATIO: .67 1.36  RIGHT ECA: 113  LEFT ICA: 101 32  LEFT CCA: 191 28  LEFT ICA/CCA RATIO: .53 1.12  LEFT ECA: 94  Criteria: Quantification of carotid stenosis is based on velocity parameters that  correlate the residual internal carotid diameter with NASCET-based stenosis levels.  There is minimal plaque in the right ICA bulb. Some calcified areas are noted. Doppler analysis demonstrates a low resistance waveform with sharp upstroke. Minimal spectral broadening is noted. The right vertebral artery is antegrade in flow with a normal appearing Doppler waveform. There is also minimal plaque in the left ICA bulb which is primarily soft. Doppler analysis demonstrates a low resistance waveform with a sharp upstroke. The left vertebral artery is antegrade in flow.  IMPRESSION:  Estimated stenosis in the right and left ICA's is 0 to 50% bilaterally.  Provider: Molly Maduro       Bowel habbit change GI consult Hemorrhoids - worse  Colace  Total time over 45 mins > 50% spent counseling patient with regards to the problems above, coordination of care and treatment options.

## 2010-07-01 NOTE — Assessment & Plan Note (Signed)
BP is nl at home per pt. Will watch

## 2010-07-01 NOTE — Assessment & Plan Note (Signed)
Worse with bowel habbit changes. GI consult Colace

## 2010-07-08 ENCOUNTER — Ambulatory Visit: Payer: MEDICARE | Admitting: Gastroenterology

## 2010-08-07 NOTE — Assessment & Plan Note (Signed)
[facility]                         GASTROENTEROLOGY OFFICE NOTE   NAME:Kurt Sexton, Kurt Sexton                        MRN:          [id number]  DATE:06/22/2006                            DOB:          Sep 01, 1940    The patient comes in on April 2 and says on March 1, he doubled over  with right-sided abdominal pain.  He had a workup in the emergency room.  He said it was so severe he just could hardly stand it.  He said he  cannot imagine anything much worse.  He had a CT scan and an ultrasound,  which really did not show anything abnormal or of significance.  His  blood tests were essentially normal.  His urine was normal.  They did  not think it was a kidney stone.  There was a question about a  gallstone.  It was not felt to be his heart and these enzymes were  normal.  I did not see where an amylase was drawn.  He did have some  nausea and vomiting.  The pain was described as sharp.  He had normal  bowel sounds.  Abdomen was soft.   PAST MEDICAL HISTORY:  1. Hypertension.  2. History of CVA.  3. Hyperlipidemia.  4. Hernia repair.  5. Knee replacement.   MEDICATIONS:  He presently takes Altace, hydrochlorothiazide, Norvasc.  He was given Dilaudid, Zofran for the nausea.  He was given some  Toradol.   They indicated that they did not have a specific diagnosis and that he  should see me in consultation.  He was sent home basically on a clear  liquid diet.  When this happened, he was at church in a prayer group and  did feel like he was going to pass out.   PHYSICAL EXAMINATION:  VITAL SIGNS:  He is 6 feet 4 inches, weighs 234.  Blood pressure is 138/68.  Pulse 84 and regular.  Neck, heart, extremities all unremarkable.  ABDOMEN:  Soft.  No masses or organomegaly.  It is nontender.   IMPRESSION:  Right mid abdominal pain and right upper quadrant pain,  quite severe, sharp in nature.  It occurred one time.  He was evaluated  in the emergency room  without definitive diagnosis.   COMMENT:  I do not see where an amylase/lipase was done.  I wonder  whether he might have passed a small gallstone and this precipitated his  symptoms, but there is no way to define this  completely.  I have given him some literature on gallstones and told him  that if he has any  further recurrence of pain, I would be more than happy to see him back.  I really do not have any other explanation for his symptoms.     Ulyess Mort, MD  Electronically Signed    SML/MedQ  DD: 06/22/2006  DT: 06/23/2006  Job #: [id number]

## 2010-08-19 ENCOUNTER — Ambulatory Visit: Payer: MEDICARE | Admitting: Gastroenterology

## 2010-09-08 ENCOUNTER — Encounter: Payer: Self-pay | Admitting: Gastroenterology

## 2010-09-08 ENCOUNTER — Ambulatory Visit (INDEPENDENT_AMBULATORY_CARE_PROVIDER_SITE_OTHER): Payer: Medicare Other | Admitting: Gastroenterology

## 2010-09-08 VITALS — BP 152/80 | HR 72 | Ht 76.5 in | Wt 233.6 lb

## 2010-09-08 DIAGNOSIS — R198 Other specified symptoms and signs involving the digestive system and abdomen: Secondary | ICD-10-CM

## 2010-09-08 DIAGNOSIS — R194 Change in bowel habit: Secondary | ICD-10-CM

## 2010-09-08 NOTE — Progress Notes (Signed)
HPI: This is a  very pleasant 70 year old man.  Has takes 3 hours in bathroom to have a BM.  He does not have to push and strain, usually loose.  Will do this every other day.  This has been his pattern for 20-30 years.  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  Only eats about one meal a day.  Overall weight stable.  No  Blood in his stool.    Review of systems: Pertinent positive and negative review of systems were noted in the above HPI section.  All other review of systems was otherwise negative.   Past Medical History  Diagnosis Date  . HTN (hypertension)   . Osteoarthritis   . BPH (benign prostatic hyperplasia)     Dr Brunilda Payor  . CVA (cerebral infarction) 1998  . Prostate cancer 2009  . Vitamin D deficiency   . LBP (low back pain) 2010  . Gout     Past Surgical History  Procedure Date  . Tonsillectomy   . Hernia repair     x2  . Knee arthroscopy w/ pcl and lcl  repair & tendon graft   . Correction hammer toe      reports that he has never smoked. He has never used smokeless tobacco. He reports that he does not drink alcohol or use illicit drugs.  family history includes Diabetes in his other and Hypertension in his other.    Current Medications, Allergies were all reviewed with the patient via [facility] electronic medical record system.    Physical Exam: BP 152/80  Pulse 72  Ht 6' 4.5" (1.943 m)  Wt 233 lb 9.6 oz (105.96 kg)  BMI 28.06 kg/m2 Constitutional: generally well-appearing Psychiatric: alert and oriented x3 Eyes: extraocular movements intact Mouth: oral pharynx moist, no lesions Neck: supple no lymphadenopathy Cardiovascular: heart regular rate and rhythm Lungs: clear to auscultation bilaterally Abdomen: soft, nontender, nondistended, no obvious ascites, no peritoneal signs,  normal bowel sounds Extremities: no lower extremity edema bilaterally Skin: no lesions on visible extremities    Assessment and plan: 70 y.o. male with irregular bowel habits   it is pretty unusual for someone to be in the bathroom for 3 hours a day to move her bowels. He does not really describe straining, rather it sounds like he is incompletely evacuating. This has been his pattern for 20-30 years and so I think it is highly unlikely there is any neoplastic cause. He had a colonoscopy 5 years ago and is not due for recall for another 5 years for routine cancer screening. For now I want him to start fiber supplements once daily and call my office in 4-5 weeks to report on his symptoms.

## 2010-09-08 NOTE — Patient Instructions (Addendum)
Please start taking citrucel (orange flavored) powder fiber supplement.  This may cause some bloating at first but that usually goes away. Begin with a small spoonful and work your way up to a large, heaping spoonful daily over a week. Call Dr. Christella Hartigan' office in 4-5 weeks to report on your symptoms after being on fiber every day for the month. Eating smaller, more frequent meals may help you with regularity. You are not due for screening colonoscopy until 2017 (10 years from your last one). A copy of this information will be made available to Dr. Mccullum Rea.

## 2010-09-25 ENCOUNTER — Other Ambulatory Visit: Payer: Self-pay | Admitting: Internal Medicine

## 2010-09-29 ENCOUNTER — Encounter: Payer: Self-pay | Admitting: Internal Medicine

## 2010-09-29 ENCOUNTER — Ambulatory Visit (INDEPENDENT_AMBULATORY_CARE_PROVIDER_SITE_OTHER): Payer: Medicare Other | Admitting: Internal Medicine

## 2010-09-29 DIAGNOSIS — M545 Low back pain: Secondary | ICD-10-CM

## 2010-09-29 DIAGNOSIS — E559 Vitamin D deficiency, unspecified: Secondary | ICD-10-CM

## 2010-09-29 DIAGNOSIS — R5383 Other fatigue: Secondary | ICD-10-CM

## 2010-09-29 DIAGNOSIS — I10 Essential (primary) hypertension: Secondary | ICD-10-CM

## 2010-09-29 NOTE — Assessment & Plan Note (Signed)
No DM. Power nap. Eat less carbs Wt Readings from Last 3 Encounters:  09/29/10 236 lb (107.049 kg)  09/08/10 233 lb 9.6 oz (105.96 kg)  07/01/10 232 lb (105.235 kg)

## 2010-09-29 NOTE — Assessment & Plan Note (Signed)
On Rx 

## 2010-09-29 NOTE — Progress Notes (Signed)
  Subjective:    Patient ID: Kurt Sexton, male    DOB: 11/24/1940, 70 y.o.   MRN: [id number]  HPI  The patient presents for a follow-up of  chronic hypertension, LBP, allergiescontrolled with medicines /o fatigue after meals    Review of Systems  Constitutional: Negative for appetite change, fatigue and unexpected weight change.  HENT: Negative for nosebleeds, congestion, sore throat, sneezing, trouble swallowing and neck pain.   Eyes: Negative for itching and visual disturbance.  Respiratory: Negative for cough.   Cardiovascular: Negative for chest pain, palpitations and leg swelling.  Gastrointestinal: Negative for nausea, diarrhea, blood in stool and abdominal distention.  Genitourinary: Negative for frequency and hematuria.  Musculoskeletal: Negative for back pain, joint swelling and gait problem.  Skin: Negative for rash.  Neurological: Negative for dizziness, tremors, speech difficulty and weakness.  Psychiatric/Behavioral: Negative for sleep disturbance, dysphoric mood and agitation. The patient is not nervous/anxious.        Objective:   Physical Exam  Constitutional: He is oriented to person, place, and time. He appears well-developed.  HENT:  Mouth/Throat: Oropharynx is clear and moist.  Eyes: Conjunctivae are normal. Pupils are equal, round, and reactive to light.  Neck: Normal range of motion. No JVD present. No thyromegaly present.  Cardiovascular: Normal rate, regular rhythm, normal heart sounds and intact distal pulses.  Exam reveals no gallop and no friction rub.   No murmur heard. Pulmonary/Chest: Effort normal and breath sounds normal. No respiratory distress. He has no wheezes. He has no rales. He exhibits no tenderness.  Abdominal: Soft. Bowel sounds are normal. He exhibits no distension and no mass. There is no tenderness. There is no rebound and no guarding.  Musculoskeletal: Normal range of motion. He exhibits no edema and no tenderness.  Lymphadenopathy:      He has no cervical adenopathy.  Neurological: He is alert and oriented to person, place, and time. He has normal reflexes. No cranial nerve deficit. He exhibits normal muscle tone. Coordination normal.  Skin: Skin is warm and dry. No rash noted.  Psychiatric: He has a normal mood and affect. His behavior is normal. Judgment and thought content normal.          Assessment & Plan:  Zostavax info given

## 2010-09-29 NOTE — Assessment & Plan Note (Signed)
Cont w/exercises 

## 2010-09-29 NOTE — Assessment & Plan Note (Signed)
On rx 

## 2010-10-16 ENCOUNTER — Ambulatory Visit: Payer: Self-pay | Admitting: Internal Medicine

## 2010-10-19 ENCOUNTER — Emergency Department (HOSPITAL_COMMUNITY)
Admission: EM | Admit: 2010-10-19 | Discharge: 2010-10-19 | Disposition: A | Discharge status: Home / Self Care | Payer: Medicare Other | Attending: Emergency Medicine | Admitting: Emergency Medicine

## 2010-10-19 DIAGNOSIS — Z139 Encounter for screening, unspecified: Secondary | ICD-10-CM | POA: Insufficient documentation

## 2010-10-19 DIAGNOSIS — Z8673 Personal history of transient ischemic attack (TIA), and cerebral infarction without residual deficits: Secondary | ICD-10-CM | POA: Insufficient documentation

## 2010-10-19 DIAGNOSIS — I10 Essential (primary) hypertension: Secondary | ICD-10-CM | POA: Insufficient documentation

## 2010-10-19 DIAGNOSIS — E7800 Pure hypercholesterolemia, unspecified: Secondary | ICD-10-CM | POA: Insufficient documentation

## 2010-10-19 DIAGNOSIS — Z96659 Presence of unspecified artificial knee joint: Secondary | ICD-10-CM | POA: Insufficient documentation

## 2010-10-20 ENCOUNTER — Telehealth (INDEPENDENT_AMBULATORY_CARE_PROVIDER_SITE_OTHER): Payer: Self-pay | Admitting: General Surgery

## 2010-10-20 NOTE — Telephone Encounter (Signed)
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 I read through his ER reports and spoke with Dr Michaell Cowing. This kind of issue should be evaluated with Urology. I advised patient of this.

## 2010-10-25 ENCOUNTER — Inpatient Hospital Stay (HOSPITAL_COMMUNITY)
Admission: EM | Admit: 2010-10-25 | Discharge: 2010-10-27 | DRG: 312 | Disposition: A | Discharge status: Home / Self Care | Payer: Medicare Other | Attending: Internal Medicine | Admitting: Internal Medicine

## 2010-10-25 ENCOUNTER — Emergency Department (HOSPITAL_COMMUNITY): Payer: Medicare Other

## 2010-10-25 DIAGNOSIS — Z8673 Personal history of transient ischemic attack (TIA), and cerebral infarction without residual deficits: Secondary | ICD-10-CM

## 2010-10-25 DIAGNOSIS — R609 Edema, unspecified: Secondary | ICD-10-CM | POA: Diagnosis present

## 2010-10-25 DIAGNOSIS — E860 Dehydration: Secondary | ICD-10-CM | POA: Diagnosis present

## 2010-10-25 DIAGNOSIS — E7800 Pure hypercholesterolemia, unspecified: Secondary | ICD-10-CM | POA: Diagnosis present

## 2010-10-25 DIAGNOSIS — Z7982 Long term (current) use of aspirin: Secondary | ICD-10-CM

## 2010-10-25 DIAGNOSIS — H538 Other visual disturbances: Secondary | ICD-10-CM | POA: Diagnosis present

## 2010-10-25 DIAGNOSIS — I10 Essential (primary) hypertension: Secondary | ICD-10-CM | POA: Diagnosis present

## 2010-10-25 DIAGNOSIS — R55 Syncope and collapse: Principal | ICD-10-CM | POA: Diagnosis present

## 2010-10-25 LAB — TROPONIN I: Troponin I: <0.3 ng/mL (ref <0.30)

## 2010-10-25 LAB — COMPREHENSIVE METABOLIC PANEL
BUN: 14 mg/dL (ref 6–23)
CO2: 29 mEq/L (ref 19–32)
Calcium: 8.7 mg/dL (ref 8.4–10.5)
Chloride: 103 mEq/L (ref 96–112)
Creatinine, Ser: 1.25 mg/dL (ref 0.50–1.35)
GFR calc Af Amer: >60 mL/min (ref >60)
GFR calc non Af Amer: 57 mL/min — ABNORMAL LOW (ref >60)
Glucose, Bld: 125 mg/dL — ABNORMAL HIGH (ref 70–99)
Total Bilirubin: 0.7 mg/dL (ref 0.3–1.2)

## 2010-10-25 LAB — DIFFERENTIAL
Eosinophils Absolute: 0.2 10*3/uL (ref 0.0–0.7)
Lymphocytes Relative: 31 % (ref 12–46)
Lymphs Abs: 1.2 10*3/uL (ref 0.7–4.0)
Monocytes Relative: 9 % (ref 3–12)
Neutro Abs: 2.2 10*3/uL (ref 1.7–7.7)
Neutrophils Relative %: 55 % (ref 43–77)

## 2010-10-25 LAB — CBC
Hemoglobin: 11 g/dL — ABNORMAL LOW (ref 13.0–17.0)
MCH: 26.7 pg (ref 26.0–34.0)
MCV: 84.2 fL (ref 78.0–100.0)
Platelets: 171 10*3/uL (ref 150–400)
RBC: 4.12 MIL/uL — ABNORMAL LOW (ref 4.22–5.81)
WBC: 4.1 10*3/uL (ref 4.0–10.5)

## 2010-10-25 LAB — URINALYSIS, ROUTINE W REFLEX MICROSCOPIC
Glucose, UA: NEGATIVE mg/dL
Ketones, ur: NEGATIVE mg/dL
Leukocytes, UA: NEGATIVE
Protein, ur: NEGATIVE mg/dL
Urobilinogen, UA: 0.2 mg/dL (ref 0.0–1.0)

## 2010-10-25 LAB — CK TOTAL AND CKMB (NOT AT ARMC)
Total CK: 402 U/L — ABNORMAL HIGH (ref 7–232)
Total CK: 429 U/L — ABNORMAL HIGH (ref 7–232)

## 2010-10-26 ENCOUNTER — Inpatient Hospital Stay (HOSPITAL_COMMUNITY): Payer: Medicare Other

## 2010-10-26 DIAGNOSIS — R609 Edema, unspecified: Secondary | ICD-10-CM

## 2010-10-26 LAB — PROTIME-INR
INR: 1.01 (ref 0.00–1.49)
Prothrombin Time: 13.5 seconds (ref 11.6–15.2)

## 2010-10-26 LAB — LIPID PANEL
Cholesterol: 180 mg/dL (ref 0–200)
HDL: 35 mg/dL — ABNORMAL LOW (ref >39)
Triglycerides: 102 mg/dL (ref <150)

## 2010-10-26 LAB — TROPONIN I: Troponin I: <0.3 ng/mL (ref <0.30)

## 2010-10-26 LAB — TSH: TSH: 2.223 u[IU]/mL (ref 0.350–4.500)

## 2010-10-26 LAB — PRO B NATRIURETIC PEPTIDE: Pro B Natriuretic peptide (BNP): 103.5 pg/mL (ref 0–125)

## 2010-10-26 LAB — CK TOTAL AND CKMB (NOT AT ARMC)
CK, MB: 4.7 ng/mL — ABNORMAL HIGH (ref 0.3–4.0)
Relative Index: 1.4 (ref 0.0–2.5)
Relative Index: 1.4 (ref 0.0–2.5)
Total CK: 368 U/L — ABNORMAL HIGH (ref 7–232)

## 2010-10-26 NOTE — H&P (Signed)
Kurt Sexton, Kurt Sexton                 ACCOUNT NO.:  [id number]  MEDICAL RECORD NO.:  [id number]  LOCATION:  1441                         FACILITY:  [facility]  PHYSICIAN:  Tana Felts, MD     DATE OF BIRTH:  1941/03/09  DATE OF ADMISSION:  10/25/2010 DATE OF DISCHARGE:                             HISTORY & PHYSICAL   CHIEF COMPLAINT:  Presyncopal event.  HISTORY OF PRESENT ILLNESS:  This is a very pleasant 70 year old man with past medical history notable for remote cerebrovascular event and hypertension, which is only moderately controlled who presents today after becoming acutely dizzy, warm, slightly nauseated and nearly losing consciousness at church.  He was sitting down and felt suddenly warm and lightheaded.  His vision got dark and he had to lie down.  He denies any loss of consciousness and remembers the entire event.  Afterwards, he did feel diffusely weak, but had no focal symptoms.  He denies any prodrome and his symptoms improved after getting some IV fluids in the ambulance.  He attributed these symptoms to dehydration since he said he only had 1 glass of water this morning and usually drinks a little bit more and additionally do eat much food.  He did take his blood pressure medications this morning.  On arrival to the emergency room, his blood pressure is 124/56, which he says was low for him.  He says otherwise he is feeling well.  A 10-system review is negative except as noted above.  PAST MEDICAL HISTORY:  Hypertension, stroke in 1998, and high cholesterol.  PAST SURGICAL HISTORY:  Foot surgery, hernia repair, knee replacement, Achilles tendon repair.  SOCIAL HISTORY:  He is a nonsmoker, has a rare alcoholic drink.  No known drug abuse.  He is retired from working at a tobacco factory.  FAMILY HISTORY:  Hypertension, diabetes, high cholesterol, heart disease, dementia, prostate cancer, and his sister just had a PE.  ALLERGIES:  Valium.  MEDICATIONS:   Labetalol 300 mg 3 times a day, Claritin 10 mg daily, amlodipine 10 mg once a day, aspirin 81 mg daily.  According to his outpatient physician's notes, he is also supposed to be taking losartan, but it does not appear that he takes it.  His primary care physician is Dr. Deskins Rea.  PHYSICAL EXAMINATION:  VITAL SIGNS:  Temperature 97.5, pulse 66, blood pressure 171/87, respiratory rate 16, saturating 99% on room air. GENERAL:  A well-appearing, well-nourished, well-developed African American man in no acute distress.  He appears slightly younger than his stated age and is in good spirits. HEENT:  Pupils are equal, round, reactive to light.  Extraocular movements are intact.  No scleral icterus or conjunctivitis. LUNGS:  Clear to auscultation bilaterally. CARDIAC:  Regular rate and rhythm.  No murmurs, rubs, or gallops.  He has an occasional PVC on Telemetry. ABDOMEN:  Soft, nontender, nondistended.  Normal bowel sounds. EXTREMITIES:  Warm, well-perfused.  No cyanosis or clubbing.  He has some mild pedal edema in both feet.  Nontender. NEUROLOGIC:  Strength 5/5 in all extremities and cranial nerves are intact and symmetric.  LABORATORY DATA:  Second set of cardiac enzymes showed negative troponin, CK-MB of 6.2 and  total CK of 462.  First set reveals a CK total of 402, CK-MB are 5.6 and negative troponin, creatinine was 1.25, lipase was normal.  White count 4.1, hemoglobin 11, platelets 171, normal differential.    STUDIES:  EKG showed sinus with normal axis and intervals, some left ventricular hypertrophy and some nonspecific T-wave abnormalities.  Chest x-ray showed no active disease, normal heart size.  ASSESSMENT:  This is a 70 year old man with past medical history notable for stroke and hypertension coming with a near syncopal event.  Based on his description, at times looks he was slightly dehydrated and possibly vagal episode.  He did have a little bit of chest pain in the  emergency room though it was temporarily related to eating and resolved with morphine and aspirin.  His mildly elevated CK, CK-MB seem most consistent with some dehydration and some mild renal dysfunction.  His troponins remain negative, but does want to rule out given his cardiovascular risk factors and potential chest pain.  PLAN: 1. The patient will be admitted to telemetry floor for overnight     observation. 2. We will complete 3 sets of cardiac enzymes trending to make sure     that they start going back down. 3. Continue on aspirin.  Check his cholesterol and can start on him a     statin if needed.  I have started back his high blood pressure     medications and this can be titrated.  Consider starting losartan     in the morning if he continues to be hypertensive despite     reinitiation of his home meds. 4. The patient does not have any obvious neurological cause for this     event.  I think that seems unlikely and further workup probably of     low yield. 5. We will continue the patient's Claritin since it does improve     his postnasal drip significantly. 6. We will check a D-dimer just to make sure this could not be a PE as     well as coags in case he does need anticoagulation in the future.     Tana Felts, MD     NB/MEDQ  D:  10/25/2010  T:  10/25/2010  Job:  [id number]  Electronically Signed by Tana Felts M.D. on 10/26/2010 10:14:46 AM

## 2010-11-03 NOTE — Discharge Summary (Signed)
Kurt Sexton, Kurt Sexton                 ACCOUNT NO.:  [id number]  MEDICAL RECORD NO.:  [id number]  LOCATION:  1441                         FACILITY:  [facility]  PHYSICIAN:  Erick Blinks, MD     DATE OF BIRTH:  25-Sep-1940  DATE OF ADMISSION:  10/25/2010 DATE OF DISCHARGE:  10/27/2010                              DISCHARGE SUMMARY   PRIMARY CARE PHYSICIAN:  Dr. Gabay Rea from [place].  DISCHARGE DIAGNOSES: 1. Presyncope, likely secondary to dehydration, resolved. 2. Dehydration. 3. History of cerebrovascular accident. 4. Hypertension. 5. Hyperlipidemia.  DISCHARGE MEDICATIONS: 1. Labetalol 200 mg p.o. t.i.d. 2. Amlodipine 10 mg p.o. daily. 3. Enteric-coated aspirin 81 mg p.o. daily. 4. Claritin 10 mg p.o. daily. 5. Multivitamins 1 tablet p.o. daily. 6. Vitamin D3 1000 units 1 tablet p.o. daily. 7. Clobetasol 0.05% one application daily p.r.n. 8. Hydrocortisone cream 0.2% one application topical daily p.r.n.  ADMISSION HISTORY:  This is a 70 year old African American gentleman who presents to the emergency room with complaints of feeling acutely dizzy, warm, slightly nauseated, and nearly losing consciousness.  He was brought to the emergency room after receiving IV fluids and was admitted for further evaluation.  For details, please refer the history and physical per Dr. Orson Slick on August 5.  HOSPITAL COURSE: 1. Presyncope.  The patient was adequately rehydrated with IV fluids.     He does report having some new-onset blurry vision in his right     eye.  MRI of the brain was done, which did not show any acute     intracranial abnormality.  He also did have a 2-D echocardiogram     done, which showed an ejection fraction of 60-65%, no regional wall     motion abnormalities and otherwise no other acute findings.  His     orthostatics were checked and were found to be negative.  The     patient was adequately rehydrated with the source of his symptoms     were possibly secondary to  dehydration.  He also reports that he is     noted these symptoms in the past and have resolved with after     eating toast.  His A1c was checked and was found to be 5.7.  He is     recommended to possibly check blood sugars at home.  Every time, he     has these symptoms, can follow up with his primary care physician     for further management.  He does feel somewhat unsteady on his     feet.  We will ask PT and OT to see him prior to discharge.     Otherwise clinically, he has improved.  His cardiac enzymes have     been negative x3.  His EKG does not show any acute changes.  He has     no chest pain or shortness of breath.  The patient is felt to be     stable to discharge home and continue to follow with his primary     care physician.  The remainder of his medical problems have     remained stable.  DISCHARGE INSTRUCTIONS:  The patient should continue  on a heart-healthy diet, conduct his activity as tolerated, and follow up with his primary care physician in the next 1 week for further evaluation.     Erick Blinks, MD JM/MEDQ  D:  10/27/2010  T:  10/28/2010  Job:  [id number]  cc:   Dr. Baldinger Rea  Electronically Signed by Durward Mallard MEMON  on 11/03/2010 05:11:00 PM

## 2010-11-06 ENCOUNTER — Ambulatory Visit (INDEPENDENT_AMBULATORY_CARE_PROVIDER_SITE_OTHER): Payer: Medicare Other | Admitting: Internal Medicine

## 2010-11-06 ENCOUNTER — Encounter: Payer: Self-pay | Admitting: Internal Medicine

## 2010-11-06 VITALS — BP 160/90 | HR 76 | Temp 97.6°F | Resp 16 | Wt 237.0 lb

## 2010-11-06 DIAGNOSIS — R5383 Other fatigue: Secondary | ICD-10-CM

## 2010-11-06 DIAGNOSIS — R55 Syncope and collapse: Secondary | ICD-10-CM

## 2010-11-06 DIAGNOSIS — I6523 Occlusion and stenosis of bilateral carotid arteries: Secondary | ICD-10-CM

## 2010-11-06 DIAGNOSIS — I6529 Occlusion and stenosis of unspecified carotid artery: Secondary | ICD-10-CM

## 2010-11-06 DIAGNOSIS — R5381 Other malaise: Secondary | ICD-10-CM

## 2010-11-06 DIAGNOSIS — M109 Gout, unspecified: Secondary | ICD-10-CM

## 2010-11-06 MED ORDER — AMLODIPINE BESYLATE 5 MG PO TABS: 5.0000 mg | ORAL_TABLET | Freq: Every evening | ORAL | Status: DC

## 2010-11-06 MED ORDER — LABETALOL HCL 200 MG PO TABS: 400.0000 mg | ORAL_TABLET | Freq: Three times a day (TID) | ORAL | Status: DC

## 2010-11-06 NOTE — Progress Notes (Signed)
  Subjective:    Patient ID: Kurt Sexton, male    DOB: [birth year], 70 y.o.   MRN: [id number]  HPI  C/o almost passing out at church 8/5 - he was adm to [facility] x 2 d C/o fatigue F/u HTN, CVA  Review of Systems  Constitutional: Positive for fatigue. Negative for appetite change and unexpected weight change.  HENT: Negative for nosebleeds, congestion, sore throat, sneezing, trouble swallowing and neck pain.   Eyes: Negative for itching and visual disturbance.  Respiratory: Negative for cough and chest tightness.   Cardiovascular: Negative for chest pain, palpitations and leg swelling.  Gastrointestinal: Negative for nausea, diarrhea, blood in stool and abdominal distention.  Genitourinary: Negative for frequency and hematuria.  Musculoskeletal: Negative for back pain, joint swelling and gait problem.  Skin: Negative for rash.  Neurological: Negative for dizziness, tremors, speech difficulty and weakness.  Psychiatric/Behavioral: Negative for sleep disturbance, dysphoric mood and agitation. The patient is nervous/anxious.        BP Readings from Last 3 Encounters:  11/06/10 160/90  09/29/10 132/70  09/08/10 152/80    Objective:   Physical Exam  Constitutional: He is oriented to person, place, and time. He appears well-developed.  HENT:  Mouth/Throat: Oropharynx is clear and moist.  Eyes: Conjunctivae are normal. Pupils are equal, round, and reactive to light.  Neck: Normal range of motion. No JVD present. No thyromegaly present.  Cardiovascular: Normal rate, regular rhythm, normal heart sounds and intact distal pulses.  Exam reveals no gallop and no friction rub.   No murmur heard. Pulmonary/Chest: Effort normal and breath sounds normal. No respiratory distress. He has no wheezes. He has no rales. He exhibits no tenderness.  Abdominal: Soft. Bowel sounds are normal. He exhibits no distension and no mass. There is no tenderness. There is no rebound and no guarding.  Musculoskeletal:  Normal range of motion. He exhibits no edema and no tenderness.  Lymphadenopathy:    He has no cervical adenopathy.  Neurological: He is alert and oriented to person, place, and time. He has normal reflexes. No cranial nerve deficit. He exhibits normal muscle tone. Coordination normal.  Skin: Skin is warm and dry. No rash noted.  Psychiatric: He has a normal mood and affect. His behavior is normal. Judgment and thought content normal.          Assessment & Plan:

## 2010-11-07 ENCOUNTER — Encounter: Payer: Self-pay | Admitting: Internal Medicine

## 2010-11-07 NOTE — Assessment & Plan Note (Signed)
Hosp tests, notes, labs reviewed

## 2010-11-07 NOTE — Assessment & Plan Note (Signed)
Eat more often

## 2010-11-07 NOTE — Assessment & Plan Note (Signed)
No relapse 

## 2010-11-07 NOTE — Assessment & Plan Note (Signed)
On Rx 

## 2010-12-07 ENCOUNTER — Ambulatory Visit (INDEPENDENT_AMBULATORY_CARE_PROVIDER_SITE_OTHER): Payer: Medicare Other | Admitting: Internal Medicine

## 2010-12-07 ENCOUNTER — Encounter: Payer: Self-pay | Admitting: Internal Medicine

## 2010-12-07 VITALS — BP 142/80 | HR 80 | Temp 98.2°F | Resp 16 | Wt 236.0 lb

## 2010-12-07 DIAGNOSIS — Z23 Encounter for immunization: Secondary | ICD-10-CM

## 2010-12-07 DIAGNOSIS — L259 Unspecified contact dermatitis, unspecified cause: Secondary | ICD-10-CM

## 2010-12-07 DIAGNOSIS — L309 Dermatitis, unspecified: Secondary | ICD-10-CM

## 2010-12-07 DIAGNOSIS — R5383 Other fatigue: Secondary | ICD-10-CM

## 2010-12-07 DIAGNOSIS — R5381 Other malaise: Secondary | ICD-10-CM

## 2010-12-07 DIAGNOSIS — R55 Syncope and collapse: Secondary | ICD-10-CM

## 2010-12-07 MED ORDER — METHYLPREDNISOLONE ACETATE 80 MG/ML IJ SUSP
120.0000 mg | Freq: Once | INTRAMUSCULAR | Status: AC
  Administered 2010-12-07: 120 mg via INTRAMUSCULAR

## 2010-12-07 MED ORDER — TRIAMCINOLONE ACETONIDE 0.5 % EX CREA: TOPICAL_CREAM | Freq: Three times a day (TID) | CUTANEOUS | Status: DC

## 2010-12-07 NOTE — Progress Notes (Signed)
  Subjective:    Patient ID: Kurt Sexton, male    DOB: November 13, 1940, 70 y.o.   MRN: [id number]  HPI C/o dry skin small patches off and on 12 mo 1-2-3 at the time, itchy F/u syncope and fatigue  Review of Systems  Constitutional: Negative for fever, appetite change, fatigue and unexpected weight change.  HENT: Negative for nosebleeds, congestion, sore throat, sneezing, trouble swallowing and neck pain.   Eyes: Negative for itching and visual disturbance.  Respiratory: Negative for cough.   Cardiovascular: Negative for chest pain, palpitations and leg swelling.  Gastrointestinal: Negative for nausea, diarrhea, blood in stool and abdominal distention.  Genitourinary: Negative for frequency and hematuria.  Musculoskeletal: Negative for back pain, joint swelling and gait problem.  Skin: Positive for rash. Negative for wound.  Neurological: Negative for dizziness, tremors, speech difficulty and weakness.  Psychiatric/Behavioral: Negative for sleep disturbance, dysphoric mood and agitation. The patient is not nervous/anxious.        Objective:   Physical Exam  Constitutional: He is oriented to person, place, and time. He appears well-developed and well-nourished.  HENT:  Mouth/Throat: Oropharynx is clear and moist.  Eyes: Conjunctivae are normal. Pupils are equal, round, and reactive to light.  Neck: Normal range of motion. No JVD present. No thyromegaly present.  Cardiovascular: Normal rate, regular rhythm, normal heart sounds and intact distal pulses.  Exam reveals no gallop and no friction rub.   No murmur heard. Pulmonary/Chest: Effort normal and breath sounds normal. No respiratory distress. He has no wheezes. He has no rales. He exhibits no tenderness.  Abdominal: Soft. Bowel sounds are normal. He exhibits no distension and no mass. There is no tenderness. There is no rebound and no guarding.  Musculoskeletal: Normal range of motion. He exhibits no edema and no tenderness.    Lymphadenopathy:    He has no cervical adenopathy.  Neurological: He is alert and oriented to person, place, and time. He has normal reflexes. No cranial nerve deficit. He exhibits normal muscle tone. Coordination normal.  Skin: Skin is warm and dry. Rash (small < 1cm a couple dry skin patches on B lower legs) noted.  Psychiatric: He has a normal mood and affect. His behavior is normal. Judgment and thought content normal.          Assessment & Plan:

## 2010-12-07 NOTE — Assessment & Plan Note (Signed)
Could be med related. Mild. Discussed: bx vs derm consult.  Depo 120 mg im Triamc

## 2010-12-09 NOTE — Assessment & Plan Note (Signed)
No re-occurence

## 2010-12-09 NOTE — Assessment & Plan Note (Signed)
Better  

## 2011-02-03 ENCOUNTER — Other Ambulatory Visit: Payer: Self-pay | Admitting: Internal Medicine

## 2011-03-05 ENCOUNTER — Other Ambulatory Visit: Payer: Self-pay | Admitting: *Deleted

## 2011-03-05 MED ORDER — CLOBETASOL PROPIONATE 0.05 % EX CREA: TOPICAL_CREAM | Freq: Two times a day (BID) | CUTANEOUS | Status: DC

## 2011-03-10 ENCOUNTER — Ambulatory Visit (INDEPENDENT_AMBULATORY_CARE_PROVIDER_SITE_OTHER): Payer: Medicare Other | Admitting: Internal Medicine

## 2011-03-10 ENCOUNTER — Encounter: Payer: Self-pay | Admitting: Internal Medicine

## 2011-03-10 VITALS — BP 160/82 | HR 80 | Temp 98.8°F | Resp 16 | Wt 238.0 lb

## 2011-03-10 DIAGNOSIS — M545 Low back pain: Secondary | ICD-10-CM

## 2011-03-10 DIAGNOSIS — M79671 Pain in right foot: Secondary | ICD-10-CM

## 2011-03-10 DIAGNOSIS — M79609 Pain in unspecified limb: Secondary | ICD-10-CM

## 2011-03-10 DIAGNOSIS — R5383 Other fatigue: Secondary | ICD-10-CM

## 2011-03-10 DIAGNOSIS — M109 Gout, unspecified: Secondary | ICD-10-CM

## 2011-03-10 MED ORDER — VERAPAMIL HCL ER 240 MG PO TBCR: 240.0000 mg | EXTENDED_RELEASE_TABLET | Freq: Every day | ORAL | Status: DC

## 2011-03-10 MED ORDER — METHYLPREDNISOLONE ACETATE PF 80 MG/ML IJ SUSP
120.0000 mg | Freq: Once | INTRAMUSCULAR | Status: AC
  Administered 2011-03-10: 120 mg via INTRAMUSCULAR

## 2011-03-10 NOTE — Patient Instructions (Signed)
Stop Amlodipine, Vit D, multivitamin Start Verapamil Continue Labetalol

## 2011-03-10 NOTE — Progress Notes (Signed)
  Subjective:    Patient ID: Kurt Sexton, male    DOB: [birth year], 70 y.o.   MRN: [id number]  HPI  C/o rash on legs, feet, ands x 1+ year C/o R shoulder pain C/o R foot pain  Review of Systems  Constitutional: Negative for appetite change, fatigue and unexpected weight change.  HENT: Negative for nosebleeds, congestion, sore throat, sneezing, trouble swallowing and neck pain.   Eyes: Negative for itching and visual disturbance.  Respiratory: Negative for cough.   Cardiovascular: Negative for chest pain, palpitations and leg swelling.  Gastrointestinal: Negative for nausea, diarrhea, blood in stool and abdominal distention.  Genitourinary: Negative for frequency and hematuria.  Musculoskeletal: Negative for back pain, joint swelling and gait problem.  Skin: Positive for rash.  Neurological: Negative for dizziness, tremors, speech difficulty and weakness.  Psychiatric/Behavioral: Negative for sleep disturbance, dysphoric mood and agitation. The patient is not nervous/anxious.        Objective:   Physical Exam  Constitutional: He is oriented to person, place, and time. He appears well-developed.  HENT:  Mouth/Throat: Oropharynx is clear and moist.  Eyes: Conjunctivae are normal. Pupils are equal, round, and reactive to light.  Neck: Normal range of motion. No JVD present. No thyromegaly present.  Cardiovascular: Normal rate, regular rhythm, normal heart sounds and intact distal pulses.  Exam reveals no gallop and no friction rub.   No murmur heard. Pulmonary/Chest: Effort normal and breath sounds normal. No respiratory distress. He has no wheezes. He has no rales. He exhibits no tenderness.  Abdominal: Soft. Bowel sounds are normal. He exhibits no distension and no mass. There is no tenderness. There is no rebound and no guarding.  Musculoskeletal: Normal range of motion. He exhibits no edema and no tenderness.  Lymphadenopathy:    He has no cervical adenopathy.  Neurological: He  is alert and oriented to person, place, and time. He has normal reflexes. No cranial nerve deficit. He exhibits normal muscle tone. Coordination normal.  Skin: Skin is warm and dry. Rash (hyperpigmented and eryth scattered 6 mm papules) noted.  Psychiatric: He has a normal mood and affect. His behavior is normal. Judgment and thought content normal.  R dorsal pain foot pain on palpation        Assessment & Plan:

## 2011-03-12 ENCOUNTER — Other Ambulatory Visit: Payer: Self-pay | Admitting: *Deleted

## 2011-03-12 MED ORDER — MAGIC MOUTHWASH: 10.0000 mL | Freq: Four times a day (QID) | ORAL | Status: DC

## 2011-03-12 NOTE — Telephone Encounter (Signed)
Rf req for Magic mouthwash. Swish, hold and swallow 2 tsp po qid. # 300 ml. Last filled 12-22-09. Ok to Rf?

## 2011-03-12 NOTE — Telephone Encounter (Signed)
OK to fill this prescription with additional refills x0 Thank you!  

## 2011-03-14 ENCOUNTER — Encounter: Payer: Self-pay | Admitting: Internal Medicine

## 2011-03-14 NOTE — Assessment & Plan Note (Signed)
Resolved

## 2011-03-14 NOTE — Assessment & Plan Note (Signed)
Better  

## 2011-03-14 NOTE — Assessment & Plan Note (Signed)
12/12 R dorsal foot - poss capsulitis

## 2011-03-25 ENCOUNTER — Other Ambulatory Visit (INDEPENDENT_AMBULATORY_CARE_PROVIDER_SITE_OTHER): Payer: Medicare Other

## 2011-03-25 ENCOUNTER — Ambulatory Visit (INDEPENDENT_AMBULATORY_CARE_PROVIDER_SITE_OTHER): Payer: Medicare Other | Admitting: Internal Medicine

## 2011-03-25 ENCOUNTER — Encounter: Payer: Self-pay | Admitting: Internal Medicine

## 2011-03-25 ENCOUNTER — Telehealth: Payer: Self-pay | Admitting: Internal Medicine

## 2011-03-25 VITALS — BP 180/108 | HR 80 | Temp 97.0°F | Resp 16 | Wt 240.0 lb

## 2011-03-25 DIAGNOSIS — R109 Unspecified abdominal pain: Secondary | ICD-10-CM

## 2011-03-25 LAB — HEPATIC FUNCTION PANEL
ALT: 31 U/L (ref 0–53)
Alkaline Phosphatase: 99 U/L (ref 39–117)
Bilirubin, Direct: 0.1 mg/dL (ref 0.0–0.3)
Total Protein: 7.1 g/dL (ref 6.0–8.3)

## 2011-03-25 LAB — BASIC METABOLIC PANEL
CO2: 32 mEq/L (ref 19–32)
Calcium: 9.3 mg/dL (ref 8.4–10.5)
Chloride: 105 mEq/L (ref 96–112)
Sodium: 143 mEq/L (ref 135–145)

## 2011-03-25 LAB — URINALYSIS
Bilirubin Urine: NEGATIVE
Ketones, ur: NEGATIVE
Leukocytes, UA: NEGATIVE
Nitrite: NEGATIVE
pH: 6.5 (ref 5.0–8.0)

## 2011-03-25 LAB — CBC WITH DIFFERENTIAL/PLATELET
Basophils Absolute: 0 10*3/uL (ref 0.0–0.1)
Lymphocytes Relative: 23.3 % (ref 12.0–46.0)
Monocytes Relative: 8.4 % (ref 3.0–12.0)
Platelets: 179 10*3/uL (ref 150.0–400.0)
RDW: 13.4 % (ref 11.5–14.6)

## 2011-03-25 LAB — SEDIMENTATION RATE: Sed Rate: 9 mm/hr (ref 0–22)

## 2011-03-25 NOTE — Telephone Encounter (Signed)
sure

## 2011-03-25 NOTE — Patient Instructions (Signed)
Call if worse Vimovo 1 tab twice a day x 1 week then as needed

## 2011-03-25 NOTE — Telephone Encounter (Signed)
The pt called requesting same day or next day appt for stomach pain.  Please advise if i can double book   Thanks

## 2011-03-25 NOTE — Progress Notes (Signed)
  Subjective:    Patient ID: Kurt Sexton, male    DOB: 1941-03-04, 71 y.o.   MRN: [id number]  HPI C/o constant abd pain to the R from his belly button x 1 wk - no irrad, not better or worse w/food 7/10 at times. The pain got better after I palpated the area Review of Systems  Constitutional: Negative for appetite change, fatigue and unexpected weight change.  HENT: Negative for nosebleeds, congestion, sore throat, sneezing, trouble swallowing and neck pain.   Eyes: Negative for itching and visual disturbance.  Respiratory: Negative for cough.   Cardiovascular: Negative for chest pain, palpitations and leg swelling.  Gastrointestinal: Positive for abdominal pain. Negative for nausea, vomiting, diarrhea, constipation, blood in stool, abdominal distention, anal bleeding and rectal pain.  Genitourinary: Negative for dysuria, frequency and hematuria.  Musculoskeletal: Negative for back pain, joint swelling and gait problem.  Skin: Negative for rash.  Neurological: Negative for dizziness, tremors, speech difficulty and weakness.  Psychiatric/Behavioral: Negative for sleep disturbance, dysphoric mood and agitation. The patient is not nervous/anxious.        Objective:   Physical Exam  Constitutional: He is oriented to person, place, and time. He appears well-developed and well-nourished. No distress.  HENT:  Mouth/Throat: Oropharynx is clear and moist.  Eyes: Conjunctivae are normal. Pupils are equal, round, and reactive to light.  Neck: Normal range of motion. No JVD present. No thyromegaly present.  Cardiovascular: Normal rate, regular rhythm, normal heart sounds and intact distal pulses.  Exam reveals no gallop and no friction rub.   No murmur heard. Pulmonary/Chest: Effort normal and breath sounds normal. No respiratory distress. He has no wheezes. He has no rales. He exhibits no tenderness.  Abdominal: Soft. Bowel sounds are normal. He exhibits no distension and no mass. There is  tenderness (pain over R rectus muscle at the level of umbilicus - more pain on superficial palp than on deep palpation; no hernia). There is no rebound and no guarding.  Musculoskeletal: Normal range of motion. He exhibits no edema and no tenderness.  Lymphadenopathy:    He has no cervical adenopathy.  Neurological: He is alert and oriented to person, place, and time. He has normal reflexes. No cranial nerve deficit. He exhibits normal muscle tone. Coordination normal.  Skin: Skin is warm and dry. No rash noted. He is not diaphoretic.  Psychiatric: He has a normal mood and affect. His behavior is normal. Judgment and thought content normal.          Assessment & Plan:

## 2011-03-25 NOTE — Assessment & Plan Note (Signed)
1/13 to the R from umbilicus ?etiology - [facility] vs other CT abd if not better Labs Pain meds

## 2011-04-02 ENCOUNTER — Ambulatory Visit: Payer: Medicare Other | Admitting: Internal Medicine

## 2011-05-03 ENCOUNTER — Ambulatory Visit (INDEPENDENT_AMBULATORY_CARE_PROVIDER_SITE_OTHER): Payer: Medicare Other | Admitting: Internal Medicine

## 2011-05-03 ENCOUNTER — Encounter: Payer: Self-pay | Admitting: Internal Medicine

## 2011-05-03 DIAGNOSIS — L0100 Impetigo, unspecified: Secondary | ICD-10-CM

## 2011-05-03 DIAGNOSIS — R109 Unspecified abdominal pain: Secondary | ICD-10-CM

## 2011-05-03 DIAGNOSIS — I10 Essential (primary) hypertension: Secondary | ICD-10-CM

## 2011-05-03 MED ORDER — CARVEDILOL 12.5 MG PO TABS: 12.5000 mg | ORAL_TABLET | Freq: Two times a day (BID) | ORAL | Status: DC

## 2011-05-03 MED ORDER — SPIRONOLACTONE 50 MG PO TABS: 50.0000 mg | ORAL_TABLET | Freq: Every day | ORAL | Status: DC

## 2011-05-03 MED ORDER — CEPHALEXIN 500 MG PO CAPS: 1000.0000 mg | ORAL_CAPSULE | Freq: Two times a day (BID) | ORAL | Status: AC

## 2011-05-03 MED ORDER — MUPIROCIN 2 % EX OINT: TOPICAL_OINTMENT | CUTANEOUS | Status: AC

## 2011-05-03 MED ORDER — LABETALOL HCL 200 MG PO TABS: 400.0000 mg | ORAL_TABLET | Freq: Three times a day (TID) | ORAL | Status: DC

## 2011-05-03 NOTE — Progress Notes (Signed)
Patient ID: Kurt Sexton, male   DOB: 04-Feb-1941, 71 y.o.   MRN: [id number]  Subjective:    Patient ID: Kurt Sexton, male    DOB: 02-10-1941, 71 y.o.   MRN: [id number]  Hand Pain  Pertinent negatives include no chest pain.    C/o rash on R fingers #3-4 x 2 wks - hurts, discharge. He used triamc cream - no help To address elev BP  Review of Systems  Constitutional: Negative for appetite change, fatigue and unexpected weight change.  HENT: Negative for nosebleeds, congestion, sore throat, sneezing, trouble swallowing and neck pain.   Eyes: Negative for itching and visual disturbance.  Respiratory: Negative for cough.   Cardiovascular: Negative for chest pain, palpitations and leg swelling.  Gastrointestinal: Negative for nausea, diarrhea, blood in stool and abdominal distention.  Genitourinary: Negative for frequency and hematuria.  Musculoskeletal: Negative for back pain, joint swelling and gait problem.  Skin: Positive for rash.  Neurological: Negative for dizziness, tremors, speech difficulty and weakness.  Psychiatric/Behavioral: Negative for sleep disturbance, dysphoric mood and agitation. The patient is not nervous/anxious.        Objective:   Physical Exam  Constitutional: He is oriented to person, place, and time. He appears well-developed.  HENT:  Mouth/Throat: Oropharynx is clear and moist.  Eyes: Conjunctivae are normal. Pupils are equal, round, and reactive to light.  Neck: Normal range of motion. No JVD present. No thyromegaly present.  Cardiovascular: Normal rate, regular rhythm, normal heart sounds and intact distal pulses.  Exam reveals no gallop and no friction rub.   No murmur heard. Pulmonary/Chest: Effort normal and breath sounds normal. No respiratory distress. He has no wheezes. He has no rales. He exhibits no tenderness.  Abdominal: Soft. Bowel sounds are normal. He exhibits no distension and no mass. There is no tenderness. There is no rebound and no  guarding.  Musculoskeletal: Normal range of motion. He exhibits no edema and no tenderness.  Lymphadenopathy:    He has no cervical adenopathy.  Neurological: He is alert and oriented to person, place, and time. He has normal reflexes. No cranial nerve deficit. He exhibits normal muscle tone. Coordination normal.  Skin: Skin is warm and dry. Rash (hyperpigmented and eryth scattered 6 mm papules) noted.  Psychiatric: He has a normal mood and affect. His behavior is normal. Judgment and thought content normal.  R hand rash - dry patch w/cracles and erosions - crusted        Assessment & Plan:

## 2011-05-03 NOTE — Assessment & Plan Note (Signed)
Po/topical abx

## 2011-05-03 NOTE — Assessment & Plan Note (Signed)
CT if needed. Recheck in 2 wks

## 2011-05-17 ENCOUNTER — Other Ambulatory Visit (INDEPENDENT_AMBULATORY_CARE_PROVIDER_SITE_OTHER): Payer: Medicare Other

## 2011-05-17 ENCOUNTER — Ambulatory Visit (INDEPENDENT_AMBULATORY_CARE_PROVIDER_SITE_OTHER): Payer: Medicare Other | Admitting: Internal Medicine

## 2011-05-17 ENCOUNTER — Encounter: Payer: Self-pay | Admitting: Internal Medicine

## 2011-05-17 VITALS — BP 190/100 | HR 84 | Temp 97.3°F | Resp 16 | Wt 245.0 lb

## 2011-05-17 DIAGNOSIS — R51 Headache: Secondary | ICD-10-CM

## 2011-05-17 DIAGNOSIS — I6350 Cerebral infarction due to unspecified occlusion or stenosis of unspecified cerebral artery: Secondary | ICD-10-CM

## 2011-05-17 DIAGNOSIS — I10 Essential (primary) hypertension: Secondary | ICD-10-CM

## 2011-05-17 DIAGNOSIS — R109 Unspecified abdominal pain: Secondary | ICD-10-CM

## 2011-05-17 DIAGNOSIS — I674 Hypertensive encephalopathy: Secondary | ICD-10-CM

## 2011-05-17 DIAGNOSIS — I639 Cerebral infarction, unspecified: Secondary | ICD-10-CM

## 2011-05-17 DIAGNOSIS — L309 Dermatitis, unspecified: Secondary | ICD-10-CM

## 2011-05-17 DIAGNOSIS — L259 Unspecified contact dermatitis, unspecified cause: Secondary | ICD-10-CM

## 2011-05-17 DIAGNOSIS — L0100 Impetigo, unspecified: Secondary | ICD-10-CM

## 2011-05-17 LAB — BASIC METABOLIC PANEL
CO2: 29 mEq/L (ref 19–32)
Calcium: 9.2 mg/dL (ref 8.4–10.5)
Chloride: 106 mEq/L (ref 96–112)
Creatinine, Ser: 1.2 mg/dL (ref 0.4–1.5)
Sodium: 141 mEq/L (ref 135–145)

## 2011-05-17 LAB — URINALYSIS
Bilirubin Urine: NEGATIVE
Hgb urine dipstick: NEGATIVE
Ketones, ur: NEGATIVE
Leukocytes, UA: NEGATIVE
Urobilinogen, UA: 0.2 (ref 0.0–1.0)

## 2011-05-17 LAB — CBC WITH DIFFERENTIAL/PLATELET
Basophils Relative: 1.3 % (ref 0.0–3.0)
Eosinophils Relative: 3.4 % (ref 0.0–5.0)
Hemoglobin: 11.9 g/dL — ABNORMAL LOW (ref 13.0–17.0)
Lymphocytes Relative: 30.3 % (ref 12.0–46.0)
MCHC: 32.7 g/dL (ref 30.0–36.0)
Neutro Abs: 3.2 10*3/uL (ref 1.4–7.7)
Neutrophils Relative %: 56.1 % (ref 43.0–77.0)
RBC: 4.31 Mil/uL (ref 4.22–5.81)
WBC: 5.6 10*3/uL (ref 4.5–10.5)

## 2011-05-17 LAB — HEPATIC FUNCTION PANEL
AST: 23 U/L (ref 0–37)
Albumin: 4.4 g/dL (ref 3.5–5.2)
Alkaline Phosphatase: 88 U/L (ref 39–117)
Total Protein: 7.1 g/dL (ref 6.0–8.3)

## 2011-05-17 LAB — SEDIMENTATION RATE: Sed Rate: 6 mm/hr (ref 0–22)

## 2011-05-17 MED ORDER — VERAPAMIL HCL ER 240 MG PO TBCR: 240.0000 mg | EXTENDED_RELEASE_TABLET | Freq: Every day | ORAL | Status: DC

## 2011-05-17 MED ORDER — CLOBETASOL PROPIONATE 0.05 % EX CREA: TOPICAL_CREAM | Freq: Two times a day (BID) | CUTANEOUS | Status: DC

## 2011-05-17 NOTE — Assessment & Plan Note (Signed)
2011-2012 Dry skin patches. 2013 Hand eczema Clobetasol is helping

## 2011-05-17 NOTE — Assessment & Plan Note (Signed)
Nephrol consult

## 2011-05-17 NOTE — Assessment & Plan Note (Signed)
Refractory H/o CVA in 1998 Continue with current prescription therapy as reflected on the Med list. Nephrol cons

## 2011-05-17 NOTE — Assessment & Plan Note (Signed)
Not better --will get abd CT

## 2011-05-17 NOTE — Progress Notes (Signed)
Patient ID: ROY TOKARZ, male   DOB: 02/09/1941, 71 y.o.   MRN: [id number] Patient ID: LETICIA MCDIARMID, male   DOB: 1941/01/13, 70 y.o.   MRN: [id number]  Subjective:    Patient ID: ARSALAN BRISBIN, male    DOB: 1941-01-21, 71 y.o.   MRN: [id number]  Hand Pain  Pertinent negatives include no chest pain.   C/o R flank pain in the R flank and RUQ C/o rash on R fingers #3-4 x 2 wks - hurts, discharge. He used triamc cream - no help To address elev BP  Review of Systems  Constitutional: Negative for appetite change, fatigue and unexpected weight change.  HENT: Negative for nosebleeds, congestion, sore throat, sneezing, trouble swallowing and neck pain.   Eyes: Negative for itching and visual disturbance.  Respiratory: Negative for cough.   Cardiovascular: Negative for chest pain, palpitations and leg swelling.  Gastrointestinal: Negative for nausea, diarrhea, blood in stool and abdominal distention.  Genitourinary: Negative for frequency and hematuria.  Musculoskeletal: Negative for back pain, joint swelling and gait problem.  Skin: Positive for rash.  Neurological: Negative for dizziness, tremors, speech difficulty and weakness.  Psychiatric/Behavioral: Negative for sleep disturbance, dysphoric mood and agitation. The patient is not nervous/anxious.        Objective:   Physical Exam  Constitutional: He is oriented to person, place, and time. He appears well-developed.  HENT:  Mouth/Throat: Oropharynx is clear and moist.  Eyes: Conjunctivae are normal. Pupils are equal, round, and reactive to light.  Neck: Normal range of motion. No JVD present. No thyromegaly present.  Cardiovascular: Normal rate, regular rhythm, normal heart sounds and intact distal pulses.  Exam reveals no gallop and no friction rub.   No murmur heard. Pulmonary/Chest: Effort normal and breath sounds normal. No respiratory distress. He has no wheezes. He has no rales. He exhibits no tenderness.  Abdominal: Soft. Bowel  sounds are normal. He exhibits no distension and no mass. There is tenderness (RUQ is sensitive, no mass). There is no rebound and no guarding.  Musculoskeletal: Normal range of motion. He exhibits no edema and no tenderness.  Lymphadenopathy:    He has no cervical adenopathy.  Neurological: He is alert and oriented to person, place, and time. He has normal reflexes. No cranial nerve deficit. He exhibits normal muscle tone. Coordination normal.  Skin: Skin is warm and dry. Rash (hyperpigmented and eryth scattered 6 mm papules) noted.  Psychiatric: He has a normal mood and affect. His behavior is normal. Judgment and thought content normal.  R hand rash - dry patch w/cracles and erosions - crusted - better        Assessment & Plan:

## 2011-05-17 NOTE — Assessment & Plan Note (Signed)
resolved 

## 2011-05-17 NOTE — Assessment & Plan Note (Signed)
1998 Continue with current prescription therapy as reflected on the Med list. Needs a better BP control

## 2011-05-17 NOTE — Assessment & Plan Note (Signed)
Better  

## 2011-05-21 ENCOUNTER — Ambulatory Visit (INDEPENDENT_AMBULATORY_CARE_PROVIDER_SITE_OTHER)
Admission: RE | Admit: 2011-05-21 | Discharge: 2011-05-21 | Disposition: A | Discharge status: Home / Self Care | Payer: Medicare Other | Source: Ambulatory Visit | Attending: Internal Medicine | Admitting: Internal Medicine

## 2011-05-21 DIAGNOSIS — R109 Unspecified abdominal pain: Secondary | ICD-10-CM

## 2011-05-21 MED ORDER — IOHEXOL 300 MG/ML  SOLN
100.0000 mL | Freq: Once | INTRAMUSCULAR | Status: AC | PRN
  Administered 2011-05-21: 100 mL via INTRAVENOUS

## 2011-05-26 ENCOUNTER — Telehealth: Payer: Self-pay | Admitting: Internal Medicine

## 2011-05-26 NOTE — Telephone Encounter (Signed)
Pt informed

## 2011-05-26 NOTE — Telephone Encounter (Signed)
Kurt Sexton, please, inform patient that his abd CT was normal except for some urinary bladder wall thickening - likely not causing his sx's. Does he have a urologist?

## 2011-07-16 ENCOUNTER — Encounter: Payer: Self-pay | Admitting: Internal Medicine

## 2011-07-16 ENCOUNTER — Ambulatory Visit (INDEPENDENT_AMBULATORY_CARE_PROVIDER_SITE_OTHER): Payer: Medicare Other | Admitting: Internal Medicine

## 2011-07-16 VITALS — BP 192/100 | HR 80 | Temp 97.9°F | Resp 16 | Wt 241.0 lb

## 2011-07-16 DIAGNOSIS — M1990 Unspecified osteoarthritis, unspecified site: Secondary | ICD-10-CM

## 2011-07-16 DIAGNOSIS — R109 Unspecified abdominal pain: Secondary | ICD-10-CM

## 2011-07-16 DIAGNOSIS — I674 Hypertensive encephalopathy: Secondary | ICD-10-CM

## 2011-07-16 DIAGNOSIS — R197 Diarrhea, unspecified: Secondary | ICD-10-CM

## 2011-07-16 MED ORDER — DIPHENOXYLATE-ATROPINE 2.5-0.025 MG PO TABS: 1.0000 | ORAL_TABLET | Freq: Four times a day (QID) | ORAL | Status: AC | PRN

## 2011-07-16 NOTE — Assessment & Plan Note (Signed)
Continue with current prescription therapy as reflected on the Med list.  

## 2011-07-16 NOTE — Assessment & Plan Note (Signed)
Lomotil prn 

## 2011-07-16 NOTE — Assessment & Plan Note (Signed)
Refractory H/o CVA in 1998 States BP is ok at home

## 2011-07-16 NOTE — Assessment & Plan Note (Signed)
1/13 to the R from umbilicus ?etiology [facility] vs other Abd [place] 2012 - OK S/p GI eval 2012  F/u w/GI

## 2011-07-16 NOTE — Progress Notes (Signed)
Patient ID: Kurt Sexton, male   DOB: [birth year], 71 y.o.   MRN: [id number] Patient ID: Kurt Sexton, male   DOB: 03-27-1940, 71 y.o.   MRN: [id number] Patient ID: Kurt Sexton, male   DOB: March 15, 1941, 71 y.o.   MRN: [id number]  Subjective:    Patient ID: Kurt Sexton, male    DOB: 06/12/1940, 71 y.o.   MRN: [id number]  Abdominal Pain Associated symptoms include diarrhea. Pertinent negatives include no frequency, hematuria or nausea.  Diarrhea  Associated symptoms include abdominal pain. Pertinent negatives include no coughing.  Hand Pain  Pertinent negatives include no chest pain.   C/o abd pain and diarrhea all morning - better now. BP is nl at home To address elev BP BP Readings from Last 3 Encounters:  07/16/11 192/100  05/17/11 190/100  05/03/11 200/88   Wt Readings from Last 3 Encounters:  07/16/11 241 lb (109.317 kg)  05/17/11 245 lb (111.131 kg)  05/03/11 240 lb (108.863 kg)      Review of Systems  Constitutional: Negative for appetite change, fatigue and unexpected weight change.  HENT: Negative for nosebleeds, congestion, sore throat, sneezing, trouble swallowing and neck pain.   Eyes: Negative for itching and visual disturbance.  Respiratory: Negative for cough.   Cardiovascular: Negative for chest pain, palpitations and leg swelling.  Gastrointestinal: Positive for abdominal pain and diarrhea. Negative for nausea, blood in stool and abdominal distention.  Genitourinary: Negative for frequency and hematuria.  Musculoskeletal: Negative for back pain, joint swelling and gait problem.  Skin: Positive for rash.  Neurological: Negative for dizziness, tremors, speech difficulty and weakness.  Psychiatric/Behavioral: Negative for sleep disturbance, dysphoric mood and agitation. The patient is not nervous/anxious.        Objective:   Physical Exam  Constitutional: He is oriented to person, place, and time. He appears well-developed.  HENT:  Mouth/Throat: Oropharynx is  clear and moist.  Eyes: Conjunctivae are normal. Pupils are equal, round, and reactive to light.  Neck: Normal range of motion. No JVD present. No thyromegaly present.  Cardiovascular: Normal rate, regular rhythm, normal heart sounds and intact distal pulses.  Exam reveals no gallop and no friction rub.   No murmur heard. Pulmonary/Chest: Effort normal and breath sounds normal. No respiratory distress. He has no wheezes. He has no rales. He exhibits no tenderness.  Abdominal: Soft. Bowel sounds are normal. He exhibits no distension and no mass. There is tenderness (RUQ is sensitive, no mass). There is no rebound and no guarding.  Musculoskeletal: Normal range of motion. He exhibits no edema and no tenderness.  Lymphadenopathy:    He has no cervical adenopathy.  Neurological: He is alert and oriented to person, place, and time. He has normal reflexes. No cranial nerve deficit. He exhibits normal muscle tone. Coordination normal.  Skin: Skin is warm and dry. Rash (hyperpigmented and eryth scattered 6 mm papules) noted.  Psychiatric: He has a normal mood and affect. His behavior is normal. Judgment and thought content normal.   Lab Results  Component Value Date   WBC 5.6 05/17/2011   HGB 11.9* 05/17/2011   HCT 36.5* 05/17/2011   PLT 155.0 05/17/2011   GLUCOSE 82 05/17/2011   CHOL 180 10/26/2010   TRIG 102 10/26/2010   HDL 35* 10/26/2010   LDLCALC 125* 10/26/2010   ALT 30 05/17/2011   AST 23 05/17/2011   NA 141 05/17/2011   K 3.8 05/17/2011   CL 106 05/17/2011   CREATININE 1.2 05/17/2011  BUN 15 05/17/2011   CO2 29 05/17/2011   TSH 2.223 10/26/2010   PSA 5.23* 04/16/2010   INR 1.01 10/25/2010   HGBA1C 5.7* 10/26/2010         Assessment & Plan:

## 2011-07-31 ENCOUNTER — Emergency Department (HOSPITAL_COMMUNITY): Payer: Medicare Other

## 2011-07-31 ENCOUNTER — Emergency Department (HOSPITAL_COMMUNITY)
Admission: EM | Admit: 2011-07-31 | Discharge: 2011-07-31 | Disposition: A | Discharge status: Home / Self Care | Payer: Medicare Other | Attending: Emergency Medicine | Admitting: Emergency Medicine

## 2011-07-31 DIAGNOSIS — J984 Other disorders of lung: Secondary | ICD-10-CM | POA: Insufficient documentation

## 2011-07-31 DIAGNOSIS — M542 Cervicalgia: Secondary | ICD-10-CM | POA: Insufficient documentation

## 2011-07-31 DIAGNOSIS — Q618 Other cystic kidney diseases: Secondary | ICD-10-CM | POA: Insufficient documentation

## 2011-07-31 DIAGNOSIS — K5730 Diverticulosis of large intestine without perforation or abscess without bleeding: Secondary | ICD-10-CM | POA: Insufficient documentation

## 2011-07-31 DIAGNOSIS — M549 Dorsalgia, unspecified: Secondary | ICD-10-CM | POA: Insufficient documentation

## 2011-07-31 DIAGNOSIS — Z8673 Personal history of transient ischemic attack (TIA), and cerebral infarction without residual deficits: Secondary | ICD-10-CM | POA: Insufficient documentation

## 2011-07-31 DIAGNOSIS — S301XXA Contusion of abdominal wall, initial encounter: Secondary | ICD-10-CM | POA: Insufficient documentation

## 2011-07-31 DIAGNOSIS — Y92410 Unspecified street and highway as the place of occurrence of the external cause: Secondary | ICD-10-CM | POA: Insufficient documentation

## 2011-07-31 DIAGNOSIS — I10 Essential (primary) hypertension: Secondary | ICD-10-CM | POA: Insufficient documentation

## 2011-07-31 DIAGNOSIS — S161XXA Strain of muscle, fascia and tendon at neck level, initial encounter: Secondary | ICD-10-CM

## 2011-07-31 DIAGNOSIS — Z79899 Other long term (current) drug therapy: Secondary | ICD-10-CM | POA: Insufficient documentation

## 2011-07-31 LAB — DIFFERENTIAL
Lymphocytes Relative: 33 % (ref 12–46)
Monocytes Absolute: 0.4 10*3/uL (ref 0.1–1.0)
Monocytes Relative: 7 % (ref 3–12)

## 2011-07-31 LAB — URINALYSIS, ROUTINE W REFLEX MICROSCOPIC
Bilirubin Urine: NEGATIVE
Glucose, UA: NEGATIVE mg/dL
Hgb urine dipstick: NEGATIVE
Nitrite: NEGATIVE
Specific Gravity, Urine: 1.016 (ref 1.005–1.030)
pH: 5.5 (ref 5.0–8.0)

## 2011-07-31 LAB — CBC
MCH: 26.9 pg (ref 26.0–34.0)
MCHC: 32.7 g/dL (ref 30.0–36.0)
MCV: 82.3 fL (ref 78.0–100.0)
Platelets: 186 10*3/uL (ref 150–400)
RDW: 12.5 % (ref 11.5–15.5)
WBC: 5.6 10*3/uL (ref 4.0–10.5)

## 2011-07-31 LAB — POCT I-STAT, CHEM 8
BUN: 20 mg/dL (ref 6–23)
Calcium, Ion: 1.22 mmol/L (ref 1.12–1.32)
Chloride: 106 mEq/L (ref 96–112)
Creatinine, Ser: 1.2 mg/dL (ref 0.50–1.35)
Glucose, Bld: 126 mg/dL — ABNORMAL HIGH (ref 70–99)
Potassium: 3.8 mEq/L (ref 3.5–5.1)

## 2011-07-31 LAB — COMPREHENSIVE METABOLIC PANEL
Albumin: 4.2 g/dL (ref 3.5–5.2)
Alkaline Phosphatase: 101 U/L (ref 39–117)
BUN: 20 mg/dL (ref 6–23)
Chloride: 103 mEq/L (ref 96–112)
Glucose, Bld: 117 mg/dL — ABNORMAL HIGH (ref 70–99)
Potassium: 3.6 mEq/L (ref 3.5–5.1)
Total Bilirubin: 0.5 mg/dL (ref 0.3–1.2)

## 2011-07-31 MED ORDER — MORPHINE SULFATE 4 MG/ML IJ SOLN
4.0000 mg | Freq: Once | INTRAMUSCULAR | Status: AC
  Administered 2011-07-31: 4 mg via INTRAVENOUS
  Filled 2011-07-31: qty 1

## 2011-07-31 MED ORDER — MORPHINE SULFATE 4 MG/ML IJ SOLN
4.0000 mg | INTRAMUSCULAR | Status: AC
  Administered 2011-07-31: 4 mg via INTRAVENOUS
  Filled 2011-07-31: qty 1

## 2011-07-31 MED ORDER — HYDROCODONE-ACETAMINOPHEN 5-325 MG PO TABS: 1.0000 | ORAL_TABLET | ORAL | Status: DC | PRN

## 2011-07-31 MED ORDER — SODIUM CHLORIDE 0.9 % IV SOLN
Freq: Once | INTRAVENOUS | Status: AC
  Administered 2011-07-31: 20:00:00 via INTRAVENOUS

## 2011-07-31 MED ORDER — IOHEXOL 300 MG/ML  SOLN
100.0000 mL | Freq: Once | INTRAMUSCULAR | Status: AC | PRN
  Administered 2011-07-31: 100 mL via INTRAVENOUS

## 2011-07-31 NOTE — ED Notes (Signed)
Pt back from CT

## 2011-07-31 NOTE — ED Notes (Signed)
The pt returned from xray.  C/o some lower back pain still

## 2011-07-31 NOTE — Discharge Instructions (Signed)
Cervical Sprain  A cervical sprain is when the ligaments in the neck stretch or tear. The ligaments are the tissues that hold the neck bones in place.  HOME CARE    Put ice on the injured area.   Put ice in a plastic bag.   Place a towel between your skin and the bag.   Leave the ice on for 15 to 20 minutes, 3 to 4 times a day.   Only take medicine as told by your doctor.   Keep all doctor visits as told.   Keep all physical therapy visits as told.   If your doctor gives you a neck collar, wear it as told.   Do not drive while wearing a neck collar.   Adjust your work station so that you have good posture while you work.   Avoid positions and activities that make your problems worse.   Warm up and stretch before being active.  GET HELP RIGHT AWAY IF:    You are bleeding or your stomach is upset.   You have an allergic reaction to your medicine.   Your problems (symptoms) get worse.   You develop new problems.   You lose feeling (numbness) or you cannot move (paralysis) any part of your body.   You have tingling or weakness in any part of your body.   Your pain is not controlled with medicine.   You cannot take less pain medicine over time as planned.   Your activity level does not improve as expected.  MAKE SURE YOU:    Understand these instructions.   Will watch your condition.   Will get help right away if you are not doing well or get worse.  Document Released: 08/25/2007 Document Revised: 02/25/2011 Document Reviewed: 12/10/2010  [employer] Patient Information 2012 [employer]
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
## 2011-07-31 NOTE — ED Notes (Signed)
The pt has rt shoulder pain  Suddenly.  His  bp remains high

## 2011-07-31 NOTE — ED Notes (Signed)
X-ray at bedside
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
## 2011-07-31 NOTE — Progress Notes (Signed)
Orthopedic Tech Progress Note Patient Details:  Kurt Sexton Dec 16, 1940 [id number]  Patient ID: Kurt Sexton, male   DOB: June 11, 1940, 71 y.o.   MRN: [id number] Made trauma visit  Nikki Dom 07/31/2011, 7:33 PM

## 2011-07-31 NOTE — ED Notes (Signed)
Pt transported to CT ?
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
## 2011-08-01 NOTE — ED Provider Notes (Signed)
I saw and evaluated the patient, reviewed the resident's note and I agree with the findings and plan. CRITICAL CARE Performed by: Dione Booze   Total critical care time: 35 minutes  Critical care time was exclusive of separately billable procedures and treating other patients.  Critical care was necessary to treat or prevent imminent or life-threatening deterioration.  Critical care was time spent personally by me on the following activities: development of treatment plan with patient and/or surrogate as well as nursing, discussions with consultants, evaluation of patient's response to treatment, examination of patient, obtaining history from patient or surrogate, ordering and performing treatments and interventions, ordering and review of laboratory studies, ordering and review of radiographic studies, pulse oximetry and re-evaluation of patient's condition.   Dione Booze, MD 08/01/11 [phone]

## 2011-08-03 ENCOUNTER — Telehealth: Payer: Self-pay | Admitting: Internal Medicine

## 2011-08-03 NOTE — Telephone Encounter (Signed)
Pt informed
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
## 2011-08-03 NOTE — Telephone Encounter (Signed)
He was given Hydroc/APAP on 5/11 Thx

## 2011-08-05 ENCOUNTER — Ambulatory Visit: Payer: Medicare Other | Admitting: Internal Medicine

## 2011-08-05 ENCOUNTER — Encounter: Payer: Self-pay | Admitting: Internal Medicine

## 2011-08-05 ENCOUNTER — Ambulatory Visit (INDEPENDENT_AMBULATORY_CARE_PROVIDER_SITE_OTHER): Payer: Medicare Other | Admitting: Internal Medicine

## 2011-08-05 ENCOUNTER — Telehealth: Payer: Self-pay | Admitting: Internal Medicine

## 2011-08-05 ENCOUNTER — Ambulatory Visit (INDEPENDENT_AMBULATORY_CARE_PROVIDER_SITE_OTHER)
Admission: RE | Admit: 2011-08-05 | Discharge: 2011-08-05 | Disposition: A | Discharge status: Home / Self Care | Payer: Medicare Other | Source: Ambulatory Visit | Attending: Internal Medicine | Admitting: Internal Medicine

## 2011-08-05 VITALS — BP 160/90 | HR 88 | Temp 98.1°F | Resp 16 | Wt 238.0 lb

## 2011-08-05 DIAGNOSIS — M542 Cervicalgia: Secondary | ICD-10-CM

## 2011-08-05 DIAGNOSIS — L259 Unspecified contact dermatitis, unspecified cause: Secondary | ICD-10-CM

## 2011-08-05 DIAGNOSIS — S060XAA Concussion with loss of consciousness status unknown, initial encounter: Secondary | ICD-10-CM | POA: Insufficient documentation

## 2011-08-05 DIAGNOSIS — M545 Low back pain, unspecified: Secondary | ICD-10-CM

## 2011-08-05 DIAGNOSIS — S060X9A Concussion with loss of consciousness of unspecified duration, initial encounter: Secondary | ICD-10-CM

## 2011-08-05 DIAGNOSIS — R51 Headache: Secondary | ICD-10-CM

## 2011-08-05 DIAGNOSIS — L309 Dermatitis, unspecified: Secondary | ICD-10-CM

## 2011-08-05 MED ORDER — HYDROCODONE-ACETAMINOPHEN 7.5-325 MG PO TABS: 1.0000 | ORAL_TABLET | Freq: Four times a day (QID) | ORAL | Status: AC | PRN

## 2011-08-05 NOTE — Assessment & Plan Note (Signed)
Rest CT brain if worse
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
## 2011-08-05 NOTE — Assessment & Plan Note (Signed)
Resolved

## 2011-08-05 NOTE — Telephone Encounter (Signed)
Misty Stanley, please, inform patient that his neck xray is c/w a muscle spasm Thx

## 2011-08-05 NOTE — Assessment & Plan Note (Signed)
07/31/11 S/p ER eval; CT, labs

## 2011-08-06 ENCOUNTER — Telehealth: Payer: Self-pay | Admitting: *Deleted

## 2011-08-06 MED ORDER — INDOMETHACIN 50 MG PO CAPS: 50.0000 mg | ORAL_CAPSULE | Freq: Three times a day (TID) | ORAL | Status: AC | PRN

## 2011-08-06 NOTE — Telephone Encounter (Signed)
Left detailed mess informing pt of below.  

## 2011-08-06 NOTE — Telephone Encounter (Signed)
Pt c/o pain in both feet from gout and left leg. He is requesting gout med. Please advise.

## 2011-08-06 NOTE — Telephone Encounter (Signed)
Pt informed

## 2011-08-06 NOTE — Telephone Encounter (Signed)
Indocin prn gout Thx

## 2011-08-24 ENCOUNTER — Ambulatory Visit: Payer: Medicare Other | Attending: Internal Medicine

## 2011-08-24 DIAGNOSIS — M6281 Muscle weakness (generalized): Secondary | ICD-10-CM | POA: Insufficient documentation

## 2011-08-24 DIAGNOSIS — M545 Low back pain, unspecified: Secondary | ICD-10-CM | POA: Insufficient documentation

## 2011-08-24 DIAGNOSIS — M542 Cervicalgia: Secondary | ICD-10-CM | POA: Insufficient documentation

## 2011-08-24 DIAGNOSIS — M25519 Pain in unspecified shoulder: Secondary | ICD-10-CM | POA: Insufficient documentation

## 2011-08-24 DIAGNOSIS — IMO0001 Reserved for inherently not codable concepts without codable children: Secondary | ICD-10-CM | POA: Insufficient documentation

## 2011-08-26 ENCOUNTER — Ambulatory Visit: Payer: Medicare Other

## 2011-08-26 ENCOUNTER — Encounter: Payer: Self-pay | Admitting: Internal Medicine

## 2011-08-26 ENCOUNTER — Ambulatory Visit (INDEPENDENT_AMBULATORY_CARE_PROVIDER_SITE_OTHER): Payer: Medicare Other | Admitting: Internal Medicine

## 2011-08-26 VITALS — BP 168/90 | HR 80 | Temp 98.2°F | Resp 16 | Wt 241.0 lb

## 2011-08-26 DIAGNOSIS — M79609 Pain in unspecified limb: Secondary | ICD-10-CM

## 2011-08-26 DIAGNOSIS — R51 Headache: Secondary | ICD-10-CM

## 2011-08-26 DIAGNOSIS — M542 Cervicalgia: Secondary | ICD-10-CM

## 2011-08-26 DIAGNOSIS — M25511 Pain in right shoulder: Secondary | ICD-10-CM

## 2011-08-26 DIAGNOSIS — M25519 Pain in unspecified shoulder: Secondary | ICD-10-CM
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
## 2011-08-26 NOTE — Assessment & Plan Note (Signed)
Continue with current prescription therapy as reflected on the Med list.  

## 2011-08-29 ENCOUNTER — Encounter: Payer: Self-pay | Admitting: Internal Medicine

## 2011-08-30 ENCOUNTER — Telehealth: Payer: Self-pay | Admitting: Internal Medicine

## 2011-08-30 ENCOUNTER — Ambulatory Visit: Payer: Medicare Other

## 2011-08-30 DIAGNOSIS — H539 Unspecified visual disturbance: Secondary | ICD-10-CM

## 2011-08-30 DIAGNOSIS — R51 Headache: Secondary | ICD-10-CM
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
## 2011-08-30 NOTE — Telephone Encounter (Signed)
CINDY HAS NOTICED THAT THE PATIENTS R. EYE DOESN'T KEEP L. GAZE.  ALSO CAN'T SHRUG HIS R. SHOULDER.  SHE NOTICED HE HAD A CONCUSSION.  SHE WANTS TO BE SURE HE IS CLEAR FOR PHYSICAL THERAPY.  SHE DIDN'T NOTICE WHERE HE HAD HAD A HEAD CT.

## 2011-08-31 NOTE — Telephone Encounter (Signed)
I spoke with Arline Asp.  She thinks a head CT would be a good idea.  Will the patient need to come for an ov first?  Or can it be ordered?

## 2011-09-01 NOTE — Telephone Encounter (Signed)
Will sch head CT AP

## 2011-09-03 ENCOUNTER — Ambulatory Visit (INDEPENDENT_AMBULATORY_CARE_PROVIDER_SITE_OTHER)
Admission: RE | Admit: 2011-09-03 | Discharge: 2011-09-03 | Disposition: A | Discharge status: Home / Self Care | Payer: Medicare Other | Source: Ambulatory Visit | Attending: Internal Medicine | Admitting: Internal Medicine

## 2011-09-03 ENCOUNTER — Ambulatory Visit: Payer: Medicare Other

## 2011-09-03 ENCOUNTER — Encounter: Payer: Self-pay | Admitting: Internal Medicine

## 2011-09-03 DIAGNOSIS — R51 Headache: Secondary | ICD-10-CM

## 2011-09-03 DIAGNOSIS — H539 Unspecified visual disturbance: Secondary | ICD-10-CM

## 2011-09-07 ENCOUNTER — Ambulatory Visit: Payer: Medicare Other

## 2011-09-14 ENCOUNTER — Ambulatory Visit: Payer: Medicare Other | Admitting: Physical Therapy

## 2011-09-16 ENCOUNTER — Ambulatory Visit: Payer: Medicare Other

## 2011-09-21 ENCOUNTER — Ambulatory Visit: Payer: Medicare Other | Attending: Internal Medicine

## 2011-09-21 DIAGNOSIS — M545 Low back pain, unspecified: Secondary | ICD-10-CM | POA: Insufficient documentation

## 2011-09-21 DIAGNOSIS — M6281 Muscle weakness (generalized): Secondary | ICD-10-CM | POA: Insufficient documentation

## 2011-09-21 DIAGNOSIS — IMO0001 Reserved for inherently not codable concepts without codable children: Secondary | ICD-10-CM | POA: Insufficient documentation

## 2011-09-21 DIAGNOSIS — M542 Cervicalgia: Secondary | ICD-10-CM | POA: Insufficient documentation

## 2011-09-21 DIAGNOSIS — M25519 Pain in unspecified shoulder: Secondary | ICD-10-CM | POA: Insufficient documentation

## 2011-09-22 ENCOUNTER — Ambulatory Visit: Payer: Medicare Other

## 2011-09-24 ENCOUNTER — Ambulatory Visit (INDEPENDENT_AMBULATORY_CARE_PROVIDER_SITE_OTHER): Payer: Medicare Other | Admitting: Internal Medicine

## 2011-09-24 ENCOUNTER — Encounter: Payer: Self-pay | Admitting: Internal Medicine

## 2011-09-24 VITALS — BP 160/88 | HR 84 | Temp 98.1°F | Resp 16 | Wt 239.0 lb

## 2011-09-24 DIAGNOSIS — M542 Cervicalgia: Secondary | ICD-10-CM

## 2011-09-24 DIAGNOSIS — M25519 Pain in unspecified shoulder: Secondary | ICD-10-CM

## 2011-09-24 DIAGNOSIS — M545 Low back pain: Secondary | ICD-10-CM

## 2011-09-24 DIAGNOSIS — M25511 Pain in right shoulder: Secondary | ICD-10-CM

## 2011-09-24 DIAGNOSIS — I674 Hypertensive encephalopathy: Secondary | ICD-10-CM

## 2011-09-24 MED ORDER — METHYLPREDNISOLONE ACETATE 80 MG/ML IJ SUSP: 80.0000 mg | Freq: Once | INTRAMUSCULAR | Status: DC

## 2011-09-24 MED ORDER — TRAMADOL HCL 50 MG PO TABS: 50.0000 mg | ORAL_TABLET | Freq: Two times a day (BID) | ORAL | Status: AC | PRN

## 2011-09-24 NOTE — Assessment & Plan Note (Signed)
Continue with current prescription therapy as reflected on the Med list.  
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
## 2011-09-24 NOTE — Patient Instructions (Addendum)
Postprocedure instructions :    A Band-Aid should be left on for 12 hours. Injection therapy is not a cure itself. It is used in conjunction with other modalities. You can use nonsteroidal anti-inflammatories like ibuprofen , hot and cold compresses. Rest is recommended in the next 24 hours. You need to report immediately  if fever, chills or any signs of infection develop. 

## 2011-09-24 NOTE — Assessment & Plan Note (Signed)
Better Continue with current prescription therapy as reflected on the Med list.  
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
## 2011-09-27 ENCOUNTER — Ambulatory Visit: Payer: Medicare Other

## 2011-09-29 ENCOUNTER — Ambulatory Visit: Payer: Medicare Other

## 2011-10-04 ENCOUNTER — Ambulatory Visit: Payer: Medicare Other | Admitting: Physical Therapy

## 2011-10-06 ENCOUNTER — Ambulatory Visit: Payer: Medicare Other | Admitting: Physical Therapy

## 2011-10-11 ENCOUNTER — Ambulatory Visit: Payer: Medicare Other

## 2011-10-13 ENCOUNTER — Other Ambulatory Visit: Payer: Self-pay | Admitting: Internal Medicine

## 2011-10-13 ENCOUNTER — Ambulatory Visit: Payer: Medicare Other | Admitting: Physical Therapy

## 2011-10-14 ENCOUNTER — Other Ambulatory Visit: Payer: Self-pay | Admitting: *Deleted

## 2011-10-14 MED ORDER — CLOBETASOL PROPIONATE 0.05 % EX CREA: 1.0000 "application " | TOPICAL_CREAM | Freq: Two times a day (BID) | CUTANEOUS | Status: DC

## 2011-10-25 ENCOUNTER — Encounter: Payer: Self-pay | Admitting: Internal Medicine

## 2011-10-25 ENCOUNTER — Ambulatory Visit (INDEPENDENT_AMBULATORY_CARE_PROVIDER_SITE_OTHER): Payer: Medicare Other | Admitting: Internal Medicine

## 2011-10-25 VITALS — BP 150/90 | HR 80 | Temp 97.4°F | Resp 16 | Wt 240.0 lb

## 2011-10-25 DIAGNOSIS — I674 Hypertensive encephalopathy: Secondary | ICD-10-CM

## 2011-10-25 DIAGNOSIS — M542 Cervicalgia: Secondary | ICD-10-CM

## 2011-10-25 DIAGNOSIS — M25511 Pain in right shoulder: Secondary | ICD-10-CM

## 2011-10-25 DIAGNOSIS — M25519 Pain in unspecified shoulder: Secondary | ICD-10-CM
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
## 2011-10-25 NOTE — Assessment & Plan Note (Signed)
Better - no HAs
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
## 2011-10-25 NOTE — Assessment & Plan Note (Signed)
Continue with current prescription therapy as reflected on the Med list.  

## 2011-11-17 ENCOUNTER — Ambulatory Visit: Payer: Medicare Other | Admitting: Internal Medicine

## 2011-11-18 ENCOUNTER — Other Ambulatory Visit: Payer: Self-pay | Admitting: *Deleted

## 2011-11-18 MED ORDER — LABETALOL HCL 200 MG PO TABS: 400.0000 mg | ORAL_TABLET | Freq: Three times a day (TID) | ORAL | Status: DC

## 2011-12-07 ENCOUNTER — Ambulatory Visit (INDEPENDENT_AMBULATORY_CARE_PROVIDER_SITE_OTHER): Payer: Medicare Other | Admitting: Internal Medicine

## 2011-12-07 ENCOUNTER — Encounter: Payer: Self-pay | Admitting: Internal Medicine

## 2011-12-07 ENCOUNTER — Other Ambulatory Visit: Payer: Medicare Other

## 2011-12-07 VITALS — BP 140/60 | HR 76 | Temp 98.4°F | Resp 16 | Wt 246.0 lb

## 2011-12-07 DIAGNOSIS — R21 Rash and other nonspecific skin eruption: Secondary | ICD-10-CM

## 2011-12-07 DIAGNOSIS — Z23 Encounter for immunization: Secondary | ICD-10-CM

## 2011-12-07 DIAGNOSIS — M545 Low back pain: Secondary | ICD-10-CM

## 2011-12-07 DIAGNOSIS — M542 Cervicalgia: Secondary | ICD-10-CM

## 2011-12-07 NOTE — Assessment & Plan Note (Signed)
Continue with current prescription therapy as reflected on the Med list. In PT Better

## 2011-12-07 NOTE — Assessment & Plan Note (Signed)
In PT 
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
## 2011-12-07 NOTE — Assessment & Plan Note (Signed)
Chronic (2 years in 2013) R/o food allergies  Labs to r/o food allergies

## 2011-12-07 NOTE — Assessment & Plan Note (Signed)
He saw Dr Rennis Chris yesterday Continue with current prescription therapy as reflected on the Med list. In PT

## 2011-12-09 LAB — ALLERGEN FOOD PROFILE SPECIFIC IGE
Chicken IgE: <0.1 kU/L
Fish Cod: <0.1 kU/L
IgE (Immunoglobulin E), Serum: 5.4 IU/mL (ref 0.0–180.0)
Milk IgE: <0.1 kU/L
Peanut IgE: <0.1 kU/L
Soybean IgE: <0.1 kU/L
Tuna IgE: <0.1 kU/L
Wheat IgE: <0.1 kU/L

## 2011-12-14 LAB — IGG FOOD PANEL
Allergen, Milk, IgG: 8.61 ug/mL — ABNORMAL HIGH (ref <0.15)
Beef, IgG: 5.9 ug/mL — ABNORMAL HIGH (ref <2.0)
Corn, IgG: <0.15 ug/mL (ref <0.15)

## 2011-12-15 ENCOUNTER — Telehealth: Payer: Self-pay | Admitting: Internal Medicine

## 2011-12-15 NOTE — Telephone Encounter (Signed)
Left detailed mess informing pt of below. Copies mailed.  

## 2011-12-15 NOTE — Telephone Encounter (Signed)
Kurt Sexton, please, inform patient that he needs to avoid beef and milk - allergic  Please, mail the labs to the patient.    Thx

## 2011-12-27 ENCOUNTER — Telehealth: Payer: Self-pay | Admitting: Internal Medicine

## 2011-12-27 NOTE — Telephone Encounter (Signed)
Caller: Kurt Sexton/Patient; Phone: [phone]; Reason for Call: Patient calling to get a refill on his hydrocodone.  Please call him back.  Thanks

## 2011-12-27 NOTE — Telephone Encounter (Signed)
OK to fill this prescription with additional refills x1 Thank you!  

## 2011-12-28 MED ORDER — HYDROCODONE-ACETAMINOPHEN 7.5-325 MG PO TABS: 1.0000 | ORAL_TABLET | Freq: Four times a day (QID) | ORAL | Status: DC | PRN

## 2011-12-28 NOTE — Telephone Encounter (Signed)
See rx pls Thx
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
## 2011-12-28 NOTE — Telephone Encounter (Signed)
Which hydrocodone am I refilling? Please advise on strength, sig and quantity.

## 2011-12-29 ENCOUNTER — Ambulatory Visit (INDEPENDENT_AMBULATORY_CARE_PROVIDER_SITE_OTHER): Payer: Medicare Other | Admitting: Internal Medicine

## 2011-12-29 ENCOUNTER — Ambulatory Visit (INDEPENDENT_AMBULATORY_CARE_PROVIDER_SITE_OTHER)
Admission: RE | Admit: 2011-12-29 | Discharge: 2011-12-29 | Disposition: A | Discharge status: Home / Self Care | Payer: Medicare Other | Source: Ambulatory Visit | Attending: Internal Medicine | Admitting: Internal Medicine

## 2011-12-29 ENCOUNTER — Encounter: Payer: Self-pay | Admitting: Internal Medicine

## 2011-12-29 VITALS — BP 154/90 | HR 88 | Temp 97.9°F | Resp 16 | Wt 236.0 lb

## 2011-12-29 DIAGNOSIS — M25559 Pain in unspecified hip: Secondary | ICD-10-CM

## 2011-12-29 DIAGNOSIS — M545 Low back pain, unspecified: Secondary | ICD-10-CM

## 2011-12-29 DIAGNOSIS — R21 Rash and other nonspecific skin eruption: Secondary | ICD-10-CM

## 2011-12-29 DIAGNOSIS — M25552 Pain in left hip: Secondary | ICD-10-CM

## 2011-12-29 MED ORDER — KETOROLAC TROMETHAMINE 60 MG/2ML IM SOLN
60.0000 mg | Freq: Once | INTRAMUSCULAR | Status: AC
  Administered 2011-12-29: 60 mg via INTRAMUSCULAR

## 2011-12-29 MED ORDER — HYDROCODONE-ACETAMINOPHEN 7.5-325 MG PO TABS: 1.0000 | ORAL_TABLET | Freq: Four times a day (QID) | ORAL | Status: DC | PRN
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
## 2011-12-30 ENCOUNTER — Telehealth: Payer: Self-pay | Admitting: Internal Medicine

## 2011-12-30 DIAGNOSIS — M25552 Pain in left hip: Secondary | ICD-10-CM | POA: Insufficient documentation

## 2011-12-30 NOTE — Assessment & Plan Note (Signed)
Food allergens IgG, IgE

## 2011-12-30 NOTE — Telephone Encounter (Signed)
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 Thx

## 2011-12-30 NOTE — Assessment & Plan Note (Signed)
10/13 - poss radiculopathic See meds Call if not better

## 2011-12-30 NOTE — Assessment & Plan Note (Signed)
L>>R - poss radiculopathic

## 2011-12-31 NOTE — Telephone Encounter (Signed)
Noted. Thx.

## 2011-12-31 NOTE — Telephone Encounter (Signed)
Pt informed. He states he is some better. His pain come and goes.

## 2012-01-12 ENCOUNTER — Encounter: Payer: Self-pay | Admitting: Internal Medicine

## 2012-01-12 ENCOUNTER — Ambulatory Visit (INDEPENDENT_AMBULATORY_CARE_PROVIDER_SITE_OTHER): Payer: Medicare Other | Admitting: Internal Medicine

## 2012-01-12 VITALS — BP 136/70 | HR 76 | Temp 97.4°F | Resp 16 | Wt 234.0 lb

## 2012-01-12 DIAGNOSIS — R21 Rash and other nonspecific skin eruption: Secondary | ICD-10-CM

## 2012-01-12 DIAGNOSIS — M25552 Pain in left hip: Secondary | ICD-10-CM

## 2012-01-12 DIAGNOSIS — M545 Low back pain, unspecified: Secondary | ICD-10-CM

## 2012-01-12 DIAGNOSIS — M25559 Pain in unspecified hip: Secondary | ICD-10-CM

## 2012-01-12 NOTE — Assessment & Plan Note (Signed)
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
## 2012-01-12 NOTE — Assessment & Plan Note (Signed)
Better  

## 2012-01-12 NOTE — Assessment & Plan Note (Signed)
Ortho consult

## 2012-03-08 ENCOUNTER — Ambulatory Visit: Payer: Medicare Other | Admitting: Internal Medicine

## 2012-03-14 ENCOUNTER — Ambulatory Visit: Payer: Medicare Other | Admitting: Internal Medicine

## 2012-03-20 ENCOUNTER — Other Ambulatory Visit: Payer: Self-pay | Admitting: *Deleted

## 2012-03-20 MED ORDER — LABETALOL HCL 200 MG PO TABS: 400.0000 mg | ORAL_TABLET | Freq: Three times a day (TID) | ORAL | Status: DC

## 2012-03-23 ENCOUNTER — Ambulatory Visit: Payer: Medicare Other | Admitting: Internal Medicine

## 2012-04-17 ENCOUNTER — Other Ambulatory Visit: Payer: Self-pay | Admitting: Internal Medicine

## 2012-05-19 ENCOUNTER — Other Ambulatory Visit: Payer: Self-pay | Admitting: Internal Medicine

## 2012-05-23 ENCOUNTER — Ambulatory Visit: Payer: Medicare Other | Admitting: Internal Medicine

## 2012-05-24 ENCOUNTER — Ambulatory Visit: Payer: Medicare Other | Admitting: Internal Medicine

## 2012-05-25 ENCOUNTER — Ambulatory Visit (INDEPENDENT_AMBULATORY_CARE_PROVIDER_SITE_OTHER): Payer: Medicare Other | Admitting: Internal Medicine

## 2012-05-25 ENCOUNTER — Encounter: Payer: Self-pay | Admitting: Internal Medicine

## 2012-05-25 VITALS — BP 150/72 | HR 68 | Temp 97.4°F | Resp 16 | Wt 240.0 lb

## 2012-05-25 DIAGNOSIS — J069 Acute upper respiratory infection, unspecified: Secondary | ICD-10-CM | POA: Insufficient documentation

## 2012-05-25 DIAGNOSIS — K649 Unspecified hemorrhoids: Secondary | ICD-10-CM

## 2012-05-25 MED ORDER — HYDROCORTISONE ACETATE 25 MG RE SUPP: 25.0000 mg | Freq: Two times a day (BID) | RECTAL | Status: DC

## 2012-05-25 MED ORDER — AMOXICILLIN 500 MG PO CAPS: 1000.0000 mg | ORAL_CAPSULE | Freq: Two times a day (BID) | ORAL | Status: DC
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
## 2012-05-25 NOTE — Assessment & Plan Note (Signed)
Anoscopy Anusol HC Supp

## 2012-05-25 NOTE — Assessment & Plan Note (Signed)
Aoxicillin x 10  d

## 2012-06-01 ENCOUNTER — Other Ambulatory Visit: Payer: Self-pay | Admitting: Dermatology

## 2012-08-30 ENCOUNTER — Encounter: Payer: Self-pay | Admitting: Internal Medicine

## 2012-08-30 ENCOUNTER — Ambulatory Visit (INDEPENDENT_AMBULATORY_CARE_PROVIDER_SITE_OTHER): Payer: Medicare Other | Admitting: Internal Medicine

## 2012-08-30 VITALS — BP 138/86 | HR 76 | Temp 97.5°F | Resp 16 | Wt 235.0 lb

## 2012-08-30 DIAGNOSIS — H8110 Benign paroxysmal vertigo, unspecified ear: Secondary | ICD-10-CM | POA: Insufficient documentation

## 2012-08-30 DIAGNOSIS — R55 Syncope and collapse: Secondary | ICD-10-CM

## 2012-08-30 MED ORDER — MECLIZINE HCL 12.5 MG PO TABS: 12.5000 mg | ORAL_TABLET | Freq: Three times a day (TID) | ORAL | Status: DC | PRN

## 2012-08-30 NOTE — Patient Instructions (Signed)
Benign Positional Vertigo symptoms Start Brandt - Daroff exercise several times a day as directed if relapsed  

## 2012-08-30 NOTE — Assessment & Plan Note (Signed)
Benign Positional Vertigo symptoms Start Francee Piccolo - Daroff exercise several times a day as directed if relapsed

## 2012-08-30 NOTE — Assessment & Plan Note (Signed)
No relapse 
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
## 2012-09-13 ENCOUNTER — Telehealth: Payer: Self-pay | Admitting: *Deleted

## 2012-09-13 MED ORDER — LABETALOL HCL 200 MG PO TABS: ORAL_TABLET | ORAL | Status: DC

## 2012-09-13 NOTE — Telephone Encounter (Signed)
Pt called requesting refill on Labetalol; Rx sent to pharmacy

## 2012-09-19 ENCOUNTER — Telehealth: Payer: Self-pay | Admitting: *Deleted

## 2012-09-19 NOTE — Telephone Encounter (Signed)
Rf req for Hydroco/APAP 7.5/325 1 po qid prn pain. Ok to Rf?

## 2012-09-20 MED ORDER — HYDROCODONE-ACETAMINOPHEN 7.5-325 MG PO TABS: 1.0000 | ORAL_TABLET | Freq: Four times a day (QID) | ORAL | Status: DC | PRN

## 2012-09-20 NOTE — Telephone Encounter (Signed)
OK to fill this prescription with additional refills x0 Thank you!  

## 2012-09-20 NOTE — Telephone Encounter (Signed)
Done

## 2012-11-17 ENCOUNTER — Other Ambulatory Visit: Payer: Self-pay | Admitting: *Deleted

## 2012-11-17 MED ORDER — SPIRONOLACTONE 50 MG PO TABS: 50.0000 mg | ORAL_TABLET | Freq: Every day | ORAL | Status: DC

## 2012-12-04 ENCOUNTER — Encounter: Payer: Self-pay | Admitting: Internal Medicine

## 2012-12-04 ENCOUNTER — Ambulatory Visit (INDEPENDENT_AMBULATORY_CARE_PROVIDER_SITE_OTHER): Payer: Medicare Other | Admitting: Internal Medicine

## 2012-12-04 VITALS — BP 164/80 | HR 64 | Temp 97.2°F | Resp 16 | Wt 241.0 lb

## 2012-12-04 DIAGNOSIS — M79605 Pain in left leg: Secondary | ICD-10-CM

## 2012-12-04 DIAGNOSIS — R55 Syncope and collapse: Secondary | ICD-10-CM

## 2012-12-04 DIAGNOSIS — Z23 Encounter for immunization: Secondary | ICD-10-CM

## 2012-12-04 DIAGNOSIS — M545 Low back pain: Secondary | ICD-10-CM

## 2012-12-04 DIAGNOSIS — R21 Rash and other nonspecific skin eruption: Secondary | ICD-10-CM

## 2012-12-04 DIAGNOSIS — R1011 Right upper quadrant pain: Secondary | ICD-10-CM

## 2012-12-04 NOTE — Assessment & Plan Note (Signed)
See diet 

## 2012-12-04 NOTE — Assessment & Plan Note (Signed)
No relapse 

## 2012-12-04 NOTE — Assessment & Plan Note (Signed)
Doing well 

## 2012-12-04 NOTE — Patient Instructions (Addendum)
Gluten free trial (no wheat products) for 4-6 weeks. OK to use gluten-free bread and gluten-free pasta.  Milk free trial (no milk, ice cream, cheese and yogurt) for 4-6 weeks. OK to use almond, coconut or rice  milk. "Almond breeze" brand tastes good.  

## 2012-12-04 NOTE — Assessment & Plan Note (Signed)
abd US
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
## 2012-12-08 ENCOUNTER — Other Ambulatory Visit: Payer: Self-pay | Admitting: Internal Medicine

## 2012-12-11 ENCOUNTER — Ambulatory Visit
Admission: RE | Admit: 2012-12-11 | Discharge: 2012-12-11 | Disposition: A | Discharge status: Home / Self Care | Payer: Medicare Other | Source: Ambulatory Visit | Attending: Internal Medicine | Admitting: Internal Medicine

## 2013-01-16 ENCOUNTER — Ambulatory Visit: Payer: Self-pay

## 2013-01-24 ENCOUNTER — Ambulatory Visit: Payer: Medicare Other | Admitting: Nurse Practitioner

## 2013-01-25 ENCOUNTER — Telehealth: Payer: Self-pay

## 2013-01-25 ENCOUNTER — Ambulatory Visit (INDEPENDENT_AMBULATORY_CARE_PROVIDER_SITE_OTHER)
Admission: RE | Admit: 2013-01-25 | Discharge: 2013-01-25 | Disposition: A | Discharge status: Home / Self Care | Payer: Medicare Other | Source: Ambulatory Visit | Attending: Internal Medicine | Admitting: Internal Medicine

## 2013-01-25 ENCOUNTER — Encounter: Payer: Self-pay | Admitting: Internal Medicine

## 2013-01-25 ENCOUNTER — Ambulatory Visit (INDEPENDENT_AMBULATORY_CARE_PROVIDER_SITE_OTHER): Payer: Medicare Other | Admitting: Internal Medicine

## 2013-01-25 ENCOUNTER — Other Ambulatory Visit (INDEPENDENT_AMBULATORY_CARE_PROVIDER_SITE_OTHER): Payer: Medicare Other

## 2013-01-25 VITALS — BP 194/108 | HR 72 | Temp 97.7°F | Resp 16 | Wt 239.0 lb

## 2013-01-25 DIAGNOSIS — M79609 Pain in unspecified limb: Secondary | ICD-10-CM

## 2013-01-25 DIAGNOSIS — M79641 Pain in right hand: Secondary | ICD-10-CM

## 2013-01-25 DIAGNOSIS — M109 Gout, unspecified: Secondary | ICD-10-CM

## 2013-01-25 DIAGNOSIS — M79643 Pain in unspecified hand: Secondary | ICD-10-CM | POA: Insufficient documentation

## 2013-01-25 LAB — BASIC METABOLIC PANEL
BUN: 22 mg/dL (ref 6–23)
CO2: 25 mEq/L (ref 19–32)
Chloride: 103 mEq/L (ref 96–112)
Creatinine, Ser: 1.4 mg/dL (ref 0.4–1.5)
Glucose, Bld: 112 mg/dL — ABNORMAL HIGH (ref 70–99)
Potassium: 4.6 mEq/L (ref 3.5–5.1)

## 2013-01-25 LAB — URIC ACID: Uric Acid, Serum: 9.3 mg/dL — ABNORMAL HIGH (ref 4.0–7.8)

## 2013-01-25 MED ORDER — PREDNISONE 10 MG PO TABS: ORAL_TABLET | ORAL | Status: DC

## 2013-01-25 MED ORDER — HYDROCODONE-ACETAMINOPHEN 7.5-325 MG PO TABS: 1.0000 | ORAL_TABLET | Freq: Four times a day (QID) | ORAL | Status: DC | PRN

## 2013-01-25 MED ORDER — METHYLPREDNISOLONE ACETATE 80 MG/ML IJ SUSP
80.0000 mg | Freq: Once | INTRAMUSCULAR | Status: AC
  Administered 2013-01-25: 80 mg via INTRAMUSCULAR

## 2013-01-25 NOTE — Telephone Encounter (Signed)
Patient informed of results.  

## 2013-01-25 NOTE — Telephone Encounter (Signed)
Message copied by Eulis Manly on Thu Jan 25, 2013  3:31 PM ------      Message from: Tresa Garter      Created: Thu Jan 25, 2013 12:41 PM       Misty Stanley, please, inform patient that his R hand x ray shows OA      Thx       ------

## 2013-01-25 NOTE — Progress Notes (Signed)
Pre visit review using our clinic review tool, if applicable. No additional management support is needed unless otherwise documented below in the visit note. 

## 2013-01-25 NOTE — Assessment & Plan Note (Addendum)
Labs Will start Uloric later

## 2013-01-25 NOTE — Assessment & Plan Note (Signed)
11/14 R gout vs other MCP #2-3 X ray ACE See meds

## 2013-02-05 ENCOUNTER — Encounter: Payer: Self-pay | Admitting: Internal Medicine

## 2013-02-05 ENCOUNTER — Ambulatory Visit (INDEPENDENT_AMBULATORY_CARE_PROVIDER_SITE_OTHER): Payer: Medicare Other | Admitting: Internal Medicine

## 2013-02-05 VITALS — BP 140/58 | HR 80 | Temp 97.5°F | Resp 16 | Wt 232.0 lb

## 2013-02-05 DIAGNOSIS — I674 Hypertensive encephalopathy: Secondary | ICD-10-CM

## 2013-02-05 DIAGNOSIS — M79641 Pain in right hand: Secondary | ICD-10-CM

## 2013-02-05 DIAGNOSIS — I639 Cerebral infarction, unspecified: Secondary | ICD-10-CM

## 2013-02-05 DIAGNOSIS — M79609 Pain in unspecified limb: Secondary | ICD-10-CM

## 2013-02-05 DIAGNOSIS — I6350 Cerebral infarction due to unspecified occlusion or stenosis of unspecified cerebral artery: Secondary | ICD-10-CM

## 2013-02-05 DIAGNOSIS — M109 Gout, unspecified: Secondary | ICD-10-CM

## 2013-02-05 MED ORDER — COLCHICINE 0.6 MG PO TABS: 0.6000 mg | ORAL_TABLET | Freq: Every day | ORAL | Status: DC

## 2013-02-05 MED ORDER — SPIRONOLACTONE 50 MG PO TABS: 50.0000 mg | ORAL_TABLET | Freq: Every day | ORAL | Status: DC

## 2013-02-05 MED ORDER — FEBUXOSTAT 80 MG PO TABS: 1.0000 | ORAL_TABLET | ORAL | Status: DC

## 2013-02-05 NOTE — Assessment & Plan Note (Signed)
11/14 R gout -- MCP #2-3 Uloric Colchicine Labs in 3 mo

## 2013-02-05 NOTE — Progress Notes (Signed)
   Subjective:     C/o R hand pain and swelling x2-3 wk - much better on Rx   HPI   BP Readings from Last 3 Encounters:  02/05/13 140/58  01/25/13 194/108  12/04/12 164/80   Wt Readings from Last 3 Encounters:  02/05/13 232 lb (105.235 kg)  01/25/13 239 lb (108.41 kg)  12/04/12 241 lb (109.317 kg)      Review of Systems  Constitutional: Positive for fatigue. Negative for appetite change and unexpected weight change.  HENT: Negative for congestion, nosebleeds and sneezing.   Eyes: Negative for itching and visual disturbance.  Cardiovascular: Negative for leg swelling.  Gastrointestinal: Negative for diarrhea, blood in stool and abdominal distention.  Genitourinary: Negative for frequency and hematuria.  Musculoskeletal: Positive for arthralgias, gait problem and myalgias. Negative for joint swelling.  Skin: Negative for rash.  Neurological: Negative for tremors and speech difficulty.  Psychiatric/Behavioral: Negative for suicidal ideas, sleep disturbance, dysphoric mood and agitation. The patient is not nervous/anxious.    BP Readings from Last 3 Encounters:  02/05/13 140/58  01/25/13 194/108  12/04/12 164/80   Wt Readings from Last 3 Encounters:  02/05/13 232 lb (105.235 kg)  01/25/13 239 lb (108.41 kg)  12/04/12 241 lb (109.317 kg)        Objective:   Physical Exam  Constitutional: He is oriented to person, place, and time. He appears well-developed.  HENT:  Mouth/Throat: Oropharynx is clear and moist.  Eyes: Conjunctivae are normal. Pupils are equal, round, and reactive to light.  Neck: Normal range of motion. No JVD present. No thyromegaly present.  Cardiovascular: Normal rate, regular rhythm, normal heart sounds and intact distal pulses.  Exam reveals no gallop and no friction rub.   No murmur heard. Pulmonary/Chest: Effort normal and breath sounds normal. No respiratory distress. He has no wheezes. He has no rales. He exhibits no tenderness.  Abdominal:  Soft. Bowel sounds are normal. He exhibits no distension and no mass. There is no tenderness. There is no rebound and no guarding.  Musculoskeletal: Normal range of motion. He exhibits no edema and no tenderness.  R 2-3d MCP is less swollen and less tender  Lymphadenopathy:    He has no cervical adenopathy.  Neurological: He is alert and oriented to person, place, and time. He has normal reflexes. No cranial nerve deficit. He exhibits normal muscle tone. Coordination normal.  Skin: Skin is warm and dry. No rash noted.  Psychiatric: He has a normal mood and affect. His behavior is normal. Judgment and thought content normal.   Lab Results  Component Value Date   WBC 5.6 07/31/2011   HGB 11.9* 07/31/2011   HCT 35.0* 07/31/2011   PLT 186 07/31/2011   GLUCOSE 112* 01/25/2013   CHOL 180 10/26/2010   TRIG 102 10/26/2010   HDL 35* 10/26/2010   LDLCALC 125* 10/26/2010   ALT 23 07/31/2011   AST 25 07/31/2011   NA 136 01/25/2013   K 4.6 01/25/2013   CL 103 01/25/2013   CREATININE 1.4 01/25/2013   BUN 22 01/25/2013   CO2 25 01/25/2013   TSH 2.223 10/26/2010   PSA 5.23* 04/16/2010   INR 1.01 10/25/2010   HGBA1C 5.7* 10/26/2010         Assessment & Plan:

## 2013-02-05 NOTE — Assessment & Plan Note (Signed)
Continue with current prescription therapy as reflected on the Med list.  

## 2013-02-05 NOTE — Assessment & Plan Note (Signed)
Start Uloric and colchicine  RTC 3 mo

## 2013-02-05 NOTE — Progress Notes (Signed)
   Subjective:     C/o R hand pain and swelling x1 wk   HPI   BP Readings from Last 3 Encounters:  02/05/13 140/58  01/25/13 194/108  12/04/12 164/80   Wt Readings from Last 3 Encounters:  02/05/13 232 lb (105.235 kg)  01/25/13 239 lb (108.41 kg)  12/04/12 241 lb (109.317 kg)      Review of Systems  Constitutional: Positive for fatigue. Negative for appetite change and unexpected weight change.  HENT: Negative for congestion, nosebleeds and sneezing.   Eyes: Negative for itching and visual disturbance.  Cardiovascular: Negative for leg swelling.  Gastrointestinal: Negative for diarrhea, blood in stool and abdominal distention.  Genitourinary: Negative for frequency and hematuria.  Musculoskeletal: Positive for arthralgias, gait problem and myalgias. Negative for joint swelling.  Skin: Negative for rash.  Neurological: Negative for tremors and speech difficulty.  Psychiatric/Behavioral: Negative for suicidal ideas, sleep disturbance, dysphoric mood and agitation. The patient is not nervous/anxious.    BP Readings from Last 3 Encounters:  02/05/13 140/58  01/25/13 194/108  12/04/12 164/80   Wt Readings from Last 3 Encounters:  02/05/13 232 lb (105.235 kg)  01/25/13 239 lb (108.41 kg)  12/04/12 241 lb (109.317 kg)        Objective:   Physical Exam  Constitutional: He is oriented to person, place, and time. He appears well-developed.  HENT:  Mouth/Throat: Oropharynx is clear and moist.  Eyes: Conjunctivae are normal. Pupils are equal, round, and reactive to light.  Neck: Normal range of motion. No JVD present. No thyromegaly present.  Cardiovascular: Normal rate, regular rhythm, normal heart sounds and intact distal pulses.  Exam reveals no gallop and no friction rub.   No murmur heard. Pulmonary/Chest: Effort normal and breath sounds normal. No respiratory distress. He has no wheezes. He has no rales. He exhibits no tenderness.  Abdominal: Soft. Bowel sounds are  normal. He exhibits no distension and no mass. There is no tenderness. There is no rebound and no guarding.  Musculoskeletal: Normal range of motion. He exhibits no edema and no tenderness.  R 2-3d MCP is very swollen and tender  Lymphadenopathy:    He has no cervical adenopathy.  Neurological: He is alert and oriented to person, place, and time. He has normal reflexes. No cranial nerve deficit. He exhibits normal muscle tone. Coordination normal.  Skin: Skin is warm and dry. No rash noted.  Psychiatric: He has a normal mood and affect. His behavior is normal. Judgment and thought content normal.   Lab Results  Component Value Date   WBC 5.6 07/31/2011   HGB 11.9* 07/31/2011   HCT 35.0* 07/31/2011   PLT 186 07/31/2011   GLUCOSE 112* 01/25/2013   CHOL 180 10/26/2010   TRIG 102 10/26/2010   HDL 35* 10/26/2010   LDLCALC 125* 10/26/2010   ALT 23 07/31/2011   AST 25 07/31/2011   NA 136 01/25/2013   K 4.6 01/25/2013   CL 103 01/25/2013   CREATININE 1.4 01/25/2013   BUN 22 01/25/2013   CO2 25 01/25/2013   TSH 2.223 10/26/2010   PSA 5.23* 04/16/2010   INR 1.01 10/25/2010   HGBA1C 5.7* 10/26/2010         Assessment & Plan:

## 2013-02-05 NOTE — Progress Notes (Signed)
Pre visit review using our clinic review tool, if applicable. No additional management support is needed unless otherwise documented below in the visit note. 

## 2013-02-09 ENCOUNTER — Other Ambulatory Visit: Payer: Self-pay | Admitting: Internal Medicine

## 2013-03-09 ENCOUNTER — Encounter: Payer: Self-pay | Admitting: Gastroenterology

## 2013-03-27 HISTORY — PX: PROSTATE BIOPSY: SHX241

## 2013-04-12 ENCOUNTER — Telehealth: Payer: Self-pay | Admitting: Internal Medicine

## 2013-04-12 NOTE — Telephone Encounter (Signed)
Pt request refill for Clobetasol cream. Pt stated [facility] have been trying to get this refill for a week now, no answer. Please advise.

## 2013-04-13 ENCOUNTER — Other Ambulatory Visit: Payer: Self-pay | Admitting: *Deleted

## 2013-04-13 MED ORDER — CLOBETASOL PROPIONATE 0.05 % EX CREA: TOPICAL_CREAM | Freq: Two times a day (BID) | CUTANEOUS | Status: DC

## 2013-04-13 NOTE — Telephone Encounter (Signed)
Rf sent earlier today. Pt informed

## 2013-04-30 ENCOUNTER — Encounter: Payer: Self-pay | Admitting: Radiation Oncology

## 2013-04-30 DIAGNOSIS — C61 Malignant neoplasm of prostate: Secondary | ICD-10-CM | POA: Insufficient documentation

## 2013-04-30 NOTE — Progress Notes (Signed)
GU Location of Tumor / Histology: prostate  If Prostate Cancer, Gleason Score is (3 + 4) and PSA is (10.10 on 02/19/13)  Patient presented Nov 2009 months ago with signs/symptoms of: positive prostate biopsy Nov 2011, gleason 6 , PSA 4.93  Biopsies of prostate (if applicable) revealed:  8/0/99 adenocarcinoma 4/12 cores, Gleason 7, volume 34.18 cc History of prostate biopsies in Nov 2010, 2013 that revealed atypia  Past/Anticipated interventions by urology, if any: surveillance, repeated biopsies  Past/Anticipated interventions by medical oncology, if any: none  Weight changes, if any: no  Bowel/Bladder complaints, if any:  Urinary frequency, urgency, nocturia x 2. IPSS 4  Nausea/Vomiting, if any: no  Pain issues, if any:  no  SAFETY ISSUES:  Prior radiation? no  Pacemaker/ICD? no  Possible current pregnancy? na  Is the patient on methotrexate? no  Current Complaints / other details:  Retired from [employer], married, 2 children, 5 grandchildren Pt is not a surgical candidate.

## 2013-05-01 ENCOUNTER — Encounter: Payer: Self-pay | Admitting: Radiation Oncology

## 2013-05-01 ENCOUNTER — Ambulatory Visit
Admission: RE | Admit: 2013-05-01 | Discharge: 2013-05-01 | Disposition: A | Discharge status: Home / Self Care | Payer: Medicare Other | Source: Ambulatory Visit | Attending: Radiation Oncology | Admitting: Radiation Oncology

## 2013-05-01 VITALS — BP 129/74 | HR 62 | Temp 98.3°F | Resp 20 | Wt 239.0 lb

## 2013-05-01 DIAGNOSIS — C61 Malignant neoplasm of prostate: Secondary | ICD-10-CM

## 2013-05-01 DIAGNOSIS — I10 Essential (primary) hypertension: Secondary | ICD-10-CM | POA: Insufficient documentation

## 2013-05-01 DIAGNOSIS — E7800 Pure hypercholesterolemia, unspecified: Secondary | ICD-10-CM | POA: Insufficient documentation

## 2013-05-01 DIAGNOSIS — Z79899 Other long term (current) drug therapy: Secondary | ICD-10-CM | POA: Insufficient documentation

## 2013-05-01 DIAGNOSIS — Z8673 Personal history of transient ischemic attack (TIA), and cerebral infarction without residual deficits: Secondary | ICD-10-CM | POA: Insufficient documentation

## 2013-05-01 DIAGNOSIS — N529 Male erectile dysfunction, unspecified: Secondary | ICD-10-CM | POA: Insufficient documentation

## 2013-05-01 HISTORY — DX: Pure hypercholesterolemia, unspecified: E78.00

## 2013-05-01 NOTE — Progress Notes (Signed)
[facility] NEW PATIENT EVALUATION  Name: Kurt Sexton MRN: [id number]  Date:   05/01/2013           DOB: May 10, 1940  Status: outpatient   CC: [name], MD  Lowella Bandy, MD    REFERRING PHYSICIAN: Lowella Bandy, MD   DIAGNOSIS: Stage TI C. intermediate risk adenocarcinoma prostate   HISTORY OF PRESENT ILLNESS:  Kurt Sexton is a 73 y.o. male who is seen today for the courtesy of Dr. [name] for discussion of possible radiation therapy in the management of his stage TI C. intermediate risk adenocarcinoma prostate. He is been on active surveillance since November 2009 was found have Gleason 6 in 5-20% of 2 cores along the left mid gland. His PSA at the time of diagnosis was 4.93. Repeat biopsies in 2010 and 2013 showed atypia. His PSA rose to 10.1 on 02/19/2013 and he underwent repeat biopsies on 03/27/2013. His was found to have Gleason 7 (3+4) involving 20% of one core from the right apex and also Gleason 6 (3+3) involving less than 5% of one core from the right mid gland, less than 5% of one core from the left lateral apex and 5% of one core from the left apex. There was a atypia from the left lateral mid gland. His gland volume was approximately 34 cc. A repeat PSA on February 2 from an outside lab was down to 7.5. He is doing well from a GU and GI standpoint. His I PSS score today is 4. He does have erectile dysfunction which responds well to Levitra. His performance status is excellent.  PREVIOUS RADIATION THERAPY: No   PAST MEDICAL HISTORY:  has a past medical history of HTN (hypertension); Osteoarthritis; BPH (benign prostatic hyperplasia); CVA (cerebral infarction) (1998); Vitamin D deficiency; LBP (low back pain) (2010); Gout; Stroke (1998); High cholesterol; and Prostate cancer (01/2008).     PAST SURGICAL HISTORY:  Past Surgical History  Procedure Laterality Date  . Tonsillectomy    . Hernia repair      x2  . Knee arthroscopy w/ pcl and lcl   repair & tendon graft    . Correction hammer toe    . Prostate biopsy  01/2008    gleason 7  . Prostate biopsy  03/27/13    gleason 3+4=7, volume 34.18 cc     FAMILY HISTORY: family history includes Cancer in his brother; Diabetes in his other; Hypertension in his other. His father died at 64 and his mother died at age 42, undetermined cause of death. A brother of his recently completed external beam/IMRT under my direction this past year.   SOCIAL HISTORY:  reports that he has never smoked. He has never used smokeless tobacco. He reports that he does not drink alcohol or use illicit drugs. 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 Married with 2 children.   ALLERGIES: Amlodipine besylate; Cozaar; Diazepam; Doxazosin mesylate; Lisinopril; and Ramipril   MEDICATIONS:  Current Outpatient Prescriptions  Medication Sig Dispense Refill  . AMLODIPINE BESYLATE PO Take 1 tablet by mouth daily.      Marland Kitchen aspirin 81 MG EC tablet Take 81 mg by mouth daily.        . Cholecalciferol 1000 UNITS tablet Take 1,000 Units by mouth daily.        . clobetasol cream (TEMOVATE) 0.05 % Apply topically 2 (two) times daily.  60 g  3  . colchicine 0.6 MG tablet Take 1 tablet (0.6  mg total) by mouth daily.  30 tablet  2  . Febuxostat (ULORIC) 80 MG TABS Take 1 tablet (80 mg total) by mouth 1 day or 1 dose.  90 tablet  3  . hydrocortisone (ANUSOL-HC) 25 MG suppository Place 1 suppository (25 mg total) rectally 2 (two) times daily.  20 suppository  1  . labetalol (NORMODYNE) 200 MG tablet take 2 tablets by mouth three times a day  180 tablet  1  . loratadine (CLARITIN) 10 MG tablet Take 10 mg by mouth daily.        . meclizine (ANTIVERT) 12.5 MG tablet Take 1-2 tablets (12.5-25 mg total) by mouth 3 (three) times daily as needed for dizziness.  60 tablet  0  . spironolactone (ALDACTONE) 50 MG tablet Take 1 tablet (50 mg total) by mouth daily.  90 tablet  5  . Tamsulosin HCl (FLOMAX) 0.4 MG CAPS Take  0.4 mg by mouth at bedtime.        . senna-docusate (SENOKOT-S) 8.6-50 MG per tablet Take 2 tablets by mouth daily as needed.       Current Facility-Administered Medications  Medication Dose Route Frequency Provider Last Rate Last Dose  . methylPREDNISolone acetate (DEPO-MEDROL) injection 80 mg  80 mg Intra-articular Once [name], MD         REVIEW OF SYSTEMS:  Pertinent items are noted in HPI.    PHYSICAL EXAM:  weight is 239 lb (108.41 kg). His Kurt temperature is 98.3 F (36.8 C). His blood pressure is 129/74 and his pulse is 62. His respiration is 20.   Alert and oriented 73 year old African American male appearing younger than his stated age. Head and neck examination: Grossly unremarkable. Nodes: Without palpable cervical or supraclavicular lymphadenopathy. Chest: Lungs clear. Back: Without spinal or CVA tenderness. Abdomen: Without masses organomegaly. Genitalia: Unremarkable to inspection. Rectal: The prostate gland is normal in size and is without focal induration or nodularity. Extremities: Without edema.   LABORATORY DATA:  Lab Results  Component Value Date   WBC 5.6 07/31/2011   HGB 11.9* 07/31/2011   HCT 35.0* 07/31/2011   MCV 82.3 07/31/2011   PLT 186 07/31/2011   Lab Results  Component Value Date   NA 136 01/25/2013   K 4.6 01/25/2013   CL 103 01/25/2013   CO2 25 01/25/2013   Lab Results  Component Value Date   ALT 23 07/31/2011   AST 25 07/31/2011   ALKPHOS 101 07/31/2011   BILITOT 0.5 07/31/2011   PSA 10.1 from 02/19/2013 and 7.5 from 04/23/2013   IMPRESSION: Stage TI C. intermediate risk adenocarcinoma prostate. I explained to the patient that his prognosis is related to his stage, PSA level, and Gleason score. His stage and last PSA of 7.5 are favorable while his Gleason score of 7 is of intermediate favorability. Other prognostic factors include PSA doubling time and disease volume. He does have low disease volume with only one core Gleason 7. We discussed  surgery versus continued close observation versus radiation therapy. Radiation therapy options include seed implantation with or without 5 weeks of external beam radiation therapy or 8 weeks of external beam/IMRT. Even though his PSA approaches 10, he has focal Gleason 7, feel that he would be a candidate for seed implantation alone. Considering his excellent performance status and Gleason 7 disease, I would favor potentially curative treatment at this time. We discussed the potential acute and late toxicities of radiation therapy. We discussed radiation safety issues related to seed implantation.  He was given literature for review. At this point in time he is leaning toward seed implantation and will contact me if he wants to proceed with radiation therapy. He'll need a CT arch study if he wants to proceed with a seed implant.   PLAN: As discussed above. He will contact me if he wants to proceed with radiation therapy. I spent 60 minutes minutes face to face with the patient and more than 50% of that time was spent in counseling and/or coordination of care.

## 2013-05-01 NOTE — Progress Notes (Signed)
Please see the Nurse Progress Note in the MD Initial Consult Encounter for this patient. 

## 2013-05-05 ENCOUNTER — Other Ambulatory Visit: Payer: Self-pay | Admitting: Internal Medicine

## 2013-05-09 ENCOUNTER — Ambulatory Visit: Payer: Medicare Other | Admitting: Internal Medicine

## 2013-05-18 ENCOUNTER — Ambulatory Visit: Payer: Medicare Other | Admitting: Internal Medicine

## 2013-05-25 ENCOUNTER — Telehealth: Payer: Self-pay | Admitting: *Deleted

## 2013-05-25 ENCOUNTER — Other Ambulatory Visit (INDEPENDENT_AMBULATORY_CARE_PROVIDER_SITE_OTHER): Payer: Medicare Other

## 2013-05-25 ENCOUNTER — Ambulatory Visit (INDEPENDENT_AMBULATORY_CARE_PROVIDER_SITE_OTHER): Payer: Medicare Other | Admitting: Internal Medicine

## 2013-05-25 VITALS — BP 140/60 | HR 76 | Temp 97.6°F | Resp 16 | Wt 233.0 lb

## 2013-05-25 DIAGNOSIS — M79643 Pain in unspecified hand: Secondary | ICD-10-CM

## 2013-05-25 DIAGNOSIS — M79609 Pain in unspecified limb: Secondary | ICD-10-CM

## 2013-05-25 DIAGNOSIS — C61 Malignant neoplasm of prostate: Secondary | ICD-10-CM

## 2013-05-25 DIAGNOSIS — M109 Gout, unspecified: Secondary | ICD-10-CM

## 2013-05-25 LAB — URIC ACID: Uric Acid, Serum: 9.2 mg/dL — ABNORMAL HIGH (ref 4.0–7.8)

## 2013-05-25 LAB — PSA: PSA: 6.79 ng/mL — AB (ref 0.10–4.00)

## 2013-05-25 MED ORDER — METHYLPREDNISOLONE ACETATE 80 MG/ML IJ SUSP
80.0000 mg | Freq: Once | INTRAMUSCULAR | Status: AC
  Administered 2013-05-25: 80 mg via INTRAMUSCULAR

## 2013-05-25 MED ORDER — ALLOPURINOL 100 MG PO TABS: 100.0000 mg | ORAL_TABLET | Freq: Every day | ORAL | Status: DC

## 2013-05-25 MED ORDER — PREDNISONE 10 MG PO TABS: ORAL_TABLET | ORAL | Status: DC

## 2013-05-25 MED ORDER — FEBUXOSTAT 80 MG PO TABS: 1.0000 | ORAL_TABLET | ORAL | Status: DC

## 2013-05-25 NOTE — Assessment & Plan Note (Signed)
2015 Dr [name]

## 2013-05-25 NOTE — Assessment & Plan Note (Signed)
Not taking Uloric and Colchicine Restart Colchicine Restart Uloric in 1 week Depomedrol IM 80 mg Prednisone 10 mg: take 4 tabs a day x 3 days; then 3 tabs a day x 4 days; then 2 tabs a day x 4 days, then 1 tab a day x 6 days, then stop. Take pc.

## 2013-05-25 NOTE — Patient Instructions (Signed)
Start Prednisone and Colchicine oday Start Uloric in 3-4 days

## 2013-05-25 NOTE — Progress Notes (Signed)
   Subjective:     C/o R hand pain and swelling - chronic, worse over past  wk  F/u rashes. Not taking Uloric and Colchicine for ?reason   HPI   BP Readings from Last 3 Encounters:  05/25/13 140/60  05/01/13 129/74  02/05/13 140/58   Wt Readings from Last 3 Encounters:  05/25/13 233 lb (105.688 kg)  05/01/13 239 lb (108.41 kg)  02/05/13 232 lb (105.235 kg)      Review of Systems  Constitutional: Positive for fatigue. Negative for appetite change and unexpected weight change.  HENT: Negative for congestion, nosebleeds and sneezing.   Eyes: Negative for itching and visual disturbance.  Cardiovascular: Negative for leg swelling.  Gastrointestinal: Negative for diarrhea, blood in stool and abdominal distention.  Genitourinary: Negative for frequency and hematuria.  Musculoskeletal: Positive for arthralgias, gait problem and myalgias. Negative for joint swelling.  Skin: Positive for rash.  Neurological: Negative for tremors and speech difficulty.  Psychiatric/Behavioral: Negative for suicidal ideas, sleep disturbance, dysphoric mood and agitation. The patient is not nervous/anxious.    BP Readings from Last 3 Encounters:  05/25/13 140/60  05/01/13 129/74  02/05/13 140/58   Wt Readings from Last 3 Encounters:  05/25/13 233 lb (105.688 kg)  05/01/13 239 lb (108.41 kg)  02/05/13 232 lb (105.235 kg)        Objective:   Physical Exam  Constitutional: He is oriented to person, place, and time. He appears well-developed.  HENT:  Mouth/Throat: Oropharynx is clear and moist.  Eyes: Conjunctivae are normal. Pupils are equal, round, and reactive to light.  Neck: Normal range of motion. No JVD present. No thyromegaly present.  Cardiovascular: Normal rate, regular rhythm, normal heart sounds and intact distal pulses.  Exam reveals no gallop and no friction rub.   No murmur heard. Pulmonary/Chest: Effort normal and breath sounds normal. No respiratory distress. He has no  wheezes. He has no rales. He exhibits no tenderness.  Abdominal: Soft. Bowel sounds are normal. He exhibits no distension and no mass. There is no tenderness. There is no rebound and no guarding.  Musculoskeletal: Normal range of motion. He exhibits no edema and no tenderness.  R 2-3d MCP is very swollen and very tender  Lymphadenopathy:    He has no cervical adenopathy.  Neurological: He is alert and oriented to person, place, and time. He has normal reflexes. No cranial nerve deficit. He exhibits normal muscle tone. Coordination normal.  Skin: Skin is warm and dry. No rash noted.  Psychiatric: He has a normal mood and affect. His behavior is normal. Judgment and thought content normal.   Lab Results  Component Value Date   WBC 5.6 07/31/2011   HGB 11.9* 07/31/2011   HCT 35.0* 07/31/2011   PLT 186 07/31/2011   GLUCOSE 112* 01/25/2013   CHOL 180 10/26/2010   TRIG 102 10/26/2010   HDL 35* 10/26/2010   LDLCALC 125* 10/26/2010   ALT 23 07/31/2011   AST 25 07/31/2011   NA 136 01/25/2013   K 4.6 01/25/2013   CL 103 01/25/2013   CREATININE 1.4 01/25/2013   BUN 22 01/25/2013   CO2 25 01/25/2013   TSH 2.223 10/26/2010   PSA 5.23* 04/16/2010   INR 1.01 10/25/2010   HGBA1C 5.7* 10/26/2010         Assessment & Plan:

## 2013-05-25 NOTE — Telephone Encounter (Signed)
I gave all Rx including Prednisone to the Pt in print. Ok not to take Colchicine. I'll email Allopurinol Rx in place of Uloric to start in a few days Thx

## 2013-05-25 NOTE — Telephone Encounter (Signed)
Family member phoned and stated pharmacy had not received patient's prednisone--it had been printed.  Erx per protocol and notified patient.  Spouse stated there was another med that needed to be sent, but call was lost.  Phoned spouse back, and colchicine and uloric.  Colchicine no longer covered under patient's formulary.  Please advise asap so they can pick up med prior to leaving town.  CB# [phone]

## 2013-05-25 NOTE — Progress Notes (Signed)
Pre visit review using our clinic review tool, if applicable. No additional management support is needed unless otherwise documented below in the visit note. 

## 2013-05-28 NOTE — Telephone Encounter (Signed)
Phoned & notified spouse that script had been sent to pharmacy.

## 2013-05-30 ENCOUNTER — Encounter: Payer: Self-pay | Admitting: Internal Medicine

## 2013-05-30 NOTE — Assessment & Plan Note (Signed)
See Rx 

## 2013-06-21 ENCOUNTER — Encounter: Payer: Self-pay | Admitting: Internal Medicine

## 2013-06-21 ENCOUNTER — Telehealth: Payer: Self-pay

## 2013-06-21 ENCOUNTER — Telehealth: Payer: Self-pay | Admitting: *Deleted

## 2013-06-21 ENCOUNTER — Ambulatory Visit (INDEPENDENT_AMBULATORY_CARE_PROVIDER_SITE_OTHER): Payer: Medicare Other | Admitting: Internal Medicine

## 2013-06-21 VITALS — BP 142/68 | HR 72 | Temp 98.0°F | Resp 16 | Wt 238.0 lb

## 2013-06-21 DIAGNOSIS — R21 Rash and other nonspecific skin eruption: Secondary | ICD-10-CM

## 2013-06-21 DIAGNOSIS — M79643 Pain in unspecified hand: Secondary | ICD-10-CM

## 2013-06-21 DIAGNOSIS — M79609 Pain in unspecified limb: Secondary | ICD-10-CM

## 2013-06-21 DIAGNOSIS — M109 Gout, unspecified: Secondary | ICD-10-CM

## 2013-06-21 MED ORDER — PREDNISOLONE 5 MG PO TABS: 15.0000 mg | ORAL_TABLET | Freq: Every day | ORAL | Status: DC

## 2013-06-21 NOTE — Telephone Encounter (Signed)
Pt called states the Prednisolone is too expensive, requesting a less expensive medication.  Please advise

## 2013-06-21 NOTE — Progress Notes (Signed)
   Subjective:     C/o R hand pain and swelling - chronic, better F/u rashes. Now is taking Uloric and Colchicine - better   HPI   BP Readings from Last 3 Encounters:  06/21/13 142/68  05/25/13 140/60  05/01/13 129/74   Wt Readings from Last 3 Encounters:  06/21/13 238 lb (107.956 kg)  05/25/13 233 lb (105.688 kg)  05/01/13 239 lb (108.41 kg)      Review of Systems  Constitutional: Positive for fatigue. Negative for appetite change and unexpected weight change.  HENT: Negative for congestion, nosebleeds and sneezing.   Eyes: Negative for itching and visual disturbance.  Cardiovascular: Negative for leg swelling.  Gastrointestinal: Negative for diarrhea, blood in stool and abdominal distention.  Genitourinary: Negative for frequency and hematuria.  Musculoskeletal: Positive for arthralgias, gait problem and myalgias. Negative for joint swelling.  Skin: Positive for rash.  Neurological: Negative for tremors and speech difficulty.  Psychiatric/Behavioral: Negative for suicidal ideas, sleep disturbance, dysphoric mood and agitation. The patient is not nervous/anxious.    BP Readings from Last 3 Encounters:  06/21/13 142/68  05/25/13 140/60  05/01/13 129/74   Wt Readings from Last 3 Encounters:  06/21/13 238 lb (107.956 kg)  05/25/13 233 lb (105.688 kg)  05/01/13 239 lb (108.41 kg)        Objective:   Physical Exam  Constitutional: He is oriented to person, place, and time. He appears well-developed.  HENT:  Mouth/Throat: Oropharynx is clear and moist.  Eyes: Conjunctivae are normal. Pupils are equal, round, and reactive to light.  Neck: Normal range of motion. No JVD present. No thyromegaly present.  Cardiovascular: Normal rate, regular rhythm, normal heart sounds and intact distal pulses.  Exam reveals no gallop and no friction rub.   No murmur heard. Pulmonary/Chest: Effort normal and breath sounds normal. No respiratory distress. He has no wheezes. He has no  rales. He exhibits no tenderness.  Abdominal: Soft. Bowel sounds are normal. He exhibits no distension and no mass. There is no tenderness. There is no rebound and no guarding.  Musculoskeletal: Normal range of motion. He exhibits no edema and no tenderness.  R 2-3d MCP is not swollen and not tender  Lymphadenopathy:    He has no cervical adenopathy.  Neurological: He is alert and oriented to person, place, and time. He has normal reflexes. No cranial nerve deficit. He exhibits normal muscle tone. Coordination normal.  Skin: Skin is warm and dry. Rash noted.  Hands w/numular eczema rash - dry 2 cm and round  Psychiatric: He has a normal mood and affect. His behavior is normal. Judgment and thought content normal.   Lab Results  Component Value Date   WBC 5.6 07/31/2011   HGB 11.9* 07/31/2011   HCT 35.0* 07/31/2011   PLT 186 07/31/2011   GLUCOSE 112* 01/25/2013   CHOL 180 10/26/2010   TRIG 102 10/26/2010   HDL 35* 10/26/2010   LDLCALC 125* 10/26/2010   ALT 23 07/31/2011   AST 25 07/31/2011   NA 136 01/25/2013   K 4.6 01/25/2013   CL 103 01/25/2013   CREATININE 1.4 01/25/2013   BUN 22 01/25/2013   CO2 25 01/25/2013   TSH 2.223 10/26/2010   PSA 6.79* 05/25/2013   INR 1.01 10/25/2010   HGBA1C 5.7* 10/26/2010         Assessment & Plan:

## 2013-06-21 NOTE — Assessment & Plan Note (Addendum)
Better on po steroids - will try Predn 15 mg/d RTC 6 wks

## 2013-06-21 NOTE — Assessment & Plan Note (Signed)
Better Continue with current prescription therapy as reflected on the Med list.  

## 2013-06-21 NOTE — Telephone Encounter (Signed)
The pharmacist from [facility] [place]) called and is asking to clarify the patient's medication.  She states two different requests came through.  The 1st was for Millipred and the 2nd was for prednisolone.  She states neither of those meds are in stock.   The pharmacist is hoping to change it to prednisone.  She states that is the medicine they have in stock.   Callback for the pharmacy- [phone]

## 2013-06-21 NOTE — Progress Notes (Signed)
Pre visit review using our clinic review tool, if applicable. No additional management support is needed unless otherwise documented below in the visit note. 

## 2013-06-21 NOTE — Assessment & Plan Note (Signed)
Better  

## 2013-06-25 MED ORDER — PREDNISONE 5 MG PO TABS: 15.0000 mg | ORAL_TABLET | Freq: Every day | ORAL | Status: DC

## 2013-06-25 NOTE — Telephone Encounter (Signed)
OK Epic says it is PREDNISONE. OK to change ( I did not intend to Rx prednisolone) Thx

## 2013-06-25 NOTE — Telephone Encounter (Signed)
OK Prednisone instead - emailed Thx

## 2013-06-26 NOTE — Telephone Encounter (Signed)
I called pharmacy-- a new Rx for prednisone was sent. Pt did not p/u prednisolone Rx. However, he has not gotten the updated Rx yet either.

## 2013-08-03 ENCOUNTER — Encounter: Payer: Self-pay | Admitting: Internal Medicine

## 2013-08-03 ENCOUNTER — Other Ambulatory Visit (INDEPENDENT_AMBULATORY_CARE_PROVIDER_SITE_OTHER): Payer: Medicare Other

## 2013-08-03 ENCOUNTER — Ambulatory Visit (INDEPENDENT_AMBULATORY_CARE_PROVIDER_SITE_OTHER): Payer: Medicare Other | Admitting: Internal Medicine

## 2013-08-03 VITALS — BP 130/64 | HR 72 | Temp 97.5°F | Resp 16 | Wt 237.0 lb

## 2013-08-03 DIAGNOSIS — M79609 Pain in unspecified limb: Secondary | ICD-10-CM

## 2013-08-03 DIAGNOSIS — R21 Rash and other nonspecific skin eruption: Secondary | ICD-10-CM

## 2013-08-03 DIAGNOSIS — R7309 Other abnormal glucose: Secondary | ICD-10-CM

## 2013-08-03 DIAGNOSIS — I674 Hypertensive encephalopathy: Secondary | ICD-10-CM

## 2013-08-03 DIAGNOSIS — R739 Hyperglycemia, unspecified: Secondary | ICD-10-CM

## 2013-08-03 DIAGNOSIS — L259 Unspecified contact dermatitis, unspecified cause: Secondary | ICD-10-CM

## 2013-08-03 DIAGNOSIS — N476 Balanoposthitis: Secondary | ICD-10-CM

## 2013-08-03 DIAGNOSIS — M109 Gout, unspecified: Secondary | ICD-10-CM

## 2013-08-03 DIAGNOSIS — N481 Balanitis: Secondary | ICD-10-CM

## 2013-08-03 DIAGNOSIS — L309 Dermatitis, unspecified: Secondary | ICD-10-CM

## 2013-08-03 DIAGNOSIS — I639 Cerebral infarction, unspecified: Secondary | ICD-10-CM

## 2013-08-03 DIAGNOSIS — M79641 Pain in right hand: Secondary | ICD-10-CM

## 2013-08-03 DIAGNOSIS — I6350 Cerebral infarction due to unspecified occlusion or stenosis of unspecified cerebral artery: Secondary | ICD-10-CM

## 2013-08-03 LAB — URIC ACID: Uric Acid, Serum: 9.2 mg/dL — ABNORMAL HIGH (ref 4.0–7.8)

## 2013-08-03 LAB — URINALYSIS
Bilirubin Urine: NEGATIVE
HGB URINE DIPSTICK: NEGATIVE
Ketones, ur: NEGATIVE
LEUKOCYTES UA: NEGATIVE
NITRITE: NEGATIVE
Specific Gravity, Urine: 1.015 (ref 1.000–1.030)
Total Protein, Urine: NEGATIVE
Urine Glucose: NEGATIVE
Urobilinogen, UA: 0.2 (ref 0.0–1.0)
pH: 6 (ref 5.0–8.0)

## 2013-08-03 LAB — HEPATIC FUNCTION PANEL
ALT: 35 U/L (ref 0–53)
AST: 25 U/L (ref 0–37)
Albumin: 4 g/dL (ref 3.5–5.2)
Alkaline Phosphatase: 74 U/L (ref 39–117)
BILIRUBIN TOTAL: 0.8 mg/dL (ref 0.2–1.2)
Bilirubin, Direct: 0.1 mg/dL (ref 0.0–0.3)
Total Protein: 6.6 g/dL (ref 6.0–8.3)

## 2013-08-03 LAB — BASIC METABOLIC PANEL
BUN: 20 mg/dL (ref 6–23)
CALCIUM: 9 mg/dL (ref 8.4–10.5)
CO2: 26 meq/L (ref 19–32)
Chloride: 103 mEq/L (ref 96–112)
Creatinine, Ser: 1.4 mg/dL (ref 0.4–1.5)
GFR: 63.2 mL/min (ref >60.00)
Glucose, Bld: 117 mg/dL — ABNORMAL HIGH (ref 70–99)
Potassium: 4.6 mEq/L (ref 3.5–5.1)
SODIUM: 137 meq/L (ref 135–145)

## 2013-08-03 LAB — HEMOGLOBIN A1C: HEMOGLOBIN A1C: 5.5 % (ref 4.6–6.5)

## 2013-08-03 MED ORDER — PREDNISONE 10 MG PO TABS: ORAL_TABLET | ORAL | Status: DC

## 2013-08-03 MED ORDER — KETOCONAZOLE 2 % EX CREA: 1.0000 "application " | TOPICAL_CREAM | Freq: Two times a day (BID) | CUTANEOUS | Status: DC

## 2013-08-03 NOTE — Assessment & Plan Note (Addendum)
Derm f/u next week Predn 10 mg a day - re-start

## 2013-08-03 NOTE — Progress Notes (Signed)
Subjective:     C/o penis hurts x 5 d, rash, rash on neck - off Predn now F/u  R hand pain and swelling - chronic, better F/u rashes. Now is taking Uloric and Colchicine - better. predn 10 mg works the best for rash and gout F/u HTN   HPI   BP Readings from Last 3 Encounters:  08/03/13 130/64  06/21/13 142/68  05/25/13 140/60   Wt Readings from Last 3 Encounters:  08/03/13 237 lb (107.502 kg)  06/21/13 238 lb (107.956 kg)  05/25/13 233 lb (105.688 kg)      Review of Systems  Constitutional: Positive for fatigue. Negative for appetite change and unexpected weight change.  HENT: Negative for congestion, nosebleeds and sneezing.   Eyes: Negative for itching and visual disturbance.  Cardiovascular: Negative for leg swelling.  Gastrointestinal: Negative for diarrhea, blood in stool and abdominal distention.  Genitourinary: Negative for frequency and hematuria.  Musculoskeletal: Positive for arthralgias, gait problem and myalgias. Negative for joint swelling.  Skin: Positive for rash.  Neurological: Negative for tremors and speech difficulty.  Psychiatric/Behavioral: Negative for suicidal ideas, sleep disturbance, dysphoric mood and agitation. The patient is not nervous/anxious.    BP Readings from Last 3 Encounters:  08/03/13 130/64  06/21/13 142/68  05/25/13 140/60   Wt Readings from Last 3 Encounters:  08/03/13 237 lb (107.502 kg)  06/21/13 238 lb (107.956 kg)  05/25/13 233 lb (105.688 kg)        Objective:   Physical Exam  Constitutional: He is oriented to person, place, and time. He appears well-developed.  HENT:  Mouth/Throat: Oropharynx is clear and moist.  Eyes: Conjunctivae are normal. Pupils are equal, round, and reactive to light.  Neck: Normal range of motion. No JVD present. No thyromegaly present.  Cardiovascular: Normal rate, regular rhythm, normal heart sounds and intact distal pulses.  Exam reveals no gallop and no friction rub.   No murmur  heard. Pulmonary/Chest: Effort normal and breath sounds normal. No respiratory distress. He has no wheezes. He has no rales. He exhibits no tenderness.  Abdominal: Soft. Bowel sounds are normal. He exhibits no distension and no mass. There is no tenderness. There is no rebound and no guarding.  Musculoskeletal: Normal range of motion. He exhibits no edema and no tenderness.  R 2-3d MCP is not swollen and not tender  Lymphadenopathy:    He has no cervical adenopathy.  Neurological: He is alert and oriented to person, place, and time. He has normal reflexes. No cranial nerve deficit. He exhibits normal muscle tone. Coordination normal.  Skin: Skin is warm and dry. Rash noted.  Hands w/numular eczema rash - dry 2 cm and round - better Balanitis   Psychiatric: He has a normal mood and affect. His behavior is normal. Judgment and thought content normal.  rash on neck  Lab Results  Component Value Date   WBC 5.6 07/31/2011   HGB 11.9* 07/31/2011   HCT 35.0* 07/31/2011   PLT 186 07/31/2011   GLUCOSE 112* 01/25/2013   CHOL 180 10/26/2010   TRIG 102 10/26/2010   HDL 35* 10/26/2010   LDLCALC 125* 10/26/2010   ALT 23 07/31/2011   AST 25 07/31/2011   NA 136 01/25/2013   K 4.6 01/25/2013   CL 103 01/25/2013   CREATININE 1.4 01/25/2013   BUN 22 01/25/2013   CO2 25 01/25/2013   TSH 2.223 10/26/2010   PSA 6.79* 05/25/2013   INR 1.01 10/25/2010   HGBA1C 5.7* 10/26/2010  Assessment & Plan:

## 2013-08-03 NOTE — Assessment & Plan Note (Signed)
predn 10 mg/d 

## 2013-08-03 NOTE — Assessment & Plan Note (Signed)
labs

## 2013-08-03 NOTE — Progress Notes (Signed)
Pre visit review using our clinic review tool, if applicable. No additional management support is needed unless otherwise documented below in the visit note. 

## 2013-08-03 NOTE — Assessment & Plan Note (Signed)
Continue with current prescription therapy as reflected on the Med list. Labs  

## 2013-08-03 NOTE — Assessment & Plan Note (Signed)
Continue with current prescription therapy as reflected on the Med list.  

## 2013-08-03 NOTE — Assessment & Plan Note (Signed)
Labs Ketoconazole cream

## 2013-08-10 ENCOUNTER — Ambulatory Visit (INDEPENDENT_AMBULATORY_CARE_PROVIDER_SITE_OTHER): Payer: Medicare Other

## 2013-08-10 VITALS — BP 153/80 | HR 84 | Resp 16

## 2013-08-10 DIAGNOSIS — M1990 Unspecified osteoarthritis, unspecified site: Secondary | ICD-10-CM

## 2013-08-10 DIAGNOSIS — Q828 Other specified congenital malformations of skin: Secondary | ICD-10-CM

## 2013-08-10 DIAGNOSIS — B351 Tinea unguium: Secondary | ICD-10-CM

## 2013-08-10 DIAGNOSIS — M79609 Pain in unspecified limb: Secondary | ICD-10-CM

## 2013-08-10 DIAGNOSIS — M2040 Other hammer toe(s) (acquired), unspecified foot: Secondary | ICD-10-CM

## 2013-08-10 NOTE — Patient Instructions (Signed)

## 2013-08-10 NOTE — Progress Notes (Signed)
   Subjective:    Patient ID: SHELTON SQUARE, male    DOB: 1940-12-22, 73 y.o.   MRN: [id number]  HPI Comments: "I need these toenails and calluses cut"  Calluses:  Sub 3rd MPJ bilateral and 3rd toes bilateral      Review of Systems  All other systems reviewed and are negative.      Objective:   Physical Exam Vascular status is intact as follows pedal pulses are palpable DP +2/4 bilateral PT plus one over 4 bilateral capillary refill time 3 seconds all digits epicritic and proprioceptive sensations are intact although diminished on Semmes Weinstein testing to toes bilateral there is normal plantar response and DTRs noted dermatologically skin color pigment normal hair growth absent nails criptotic discolored friable darkened discolored and deformed 1 through 5 bilateral there is also distal clavus formations third digits bilateral. 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 Patient also keratoses sub-third MTP area right foot with hemorrhage keratoses again distal clavus third digits bilateral with hemorrhage a keratoses and pre-ulceration. Nails thick brittle crumbly friable gratified and tender painful tender on both debridement and on palliative nail care and include shoe wear. 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 The distal clinic are debrided recommended accommodative shoes socks and shoes he is now wearing socks at the current time reappointed 3 months for followup continued palliative care  multiple sites are treated with lumicain Neosporin off on debridement including the hallux borders both hallux bilateral as well as distal tuft third digit left foot multiple mycotic nails and multiple keratoses or debridement this time recheck in 3-4 months for palliative care followup next  Harriet Masson DPM

## 2013-08-11 ENCOUNTER — Other Ambulatory Visit: Payer: Self-pay | Admitting: Internal Medicine

## 2013-10-03 ENCOUNTER — Ambulatory Visit (INDEPENDENT_AMBULATORY_CARE_PROVIDER_SITE_OTHER): Payer: Medicare Other | Admitting: Internal Medicine

## 2013-10-03 ENCOUNTER — Encounter: Payer: Self-pay | Admitting: Internal Medicine

## 2013-10-03 ENCOUNTER — Other Ambulatory Visit (INDEPENDENT_AMBULATORY_CARE_PROVIDER_SITE_OTHER): Payer: Medicare Other

## 2013-10-03 VITALS — BP 150/84 | HR 72 | Temp 98.6°F | Resp 16 | Wt 238.0 lb

## 2013-10-03 DIAGNOSIS — R0609 Other forms of dyspnea: Secondary | ICD-10-CM

## 2013-10-03 DIAGNOSIS — I658 Occlusion and stenosis of other precerebral arteries: Secondary | ICD-10-CM

## 2013-10-03 DIAGNOSIS — Z8546 Personal history of malignant neoplasm of prostate: Secondary | ICD-10-CM

## 2013-10-03 DIAGNOSIS — I6529 Occlusion and stenosis of unspecified carotid artery: Secondary | ICD-10-CM

## 2013-10-03 DIAGNOSIS — I6523 Occlusion and stenosis of bilateral carotid arteries: Secondary | ICD-10-CM

## 2013-10-03 DIAGNOSIS — R0989 Other specified symptoms and signs involving the circulatory and respiratory systems: Secondary | ICD-10-CM

## 2013-10-03 DIAGNOSIS — M109 Gout, unspecified: Secondary | ICD-10-CM

## 2013-10-03 LAB — CBC WITH DIFFERENTIAL/PLATELET
Basophils Absolute: 0 10*3/uL (ref 0.0–0.1)
Basophils Relative: 0.3 % (ref 0.0–3.0)
EOS ABS: 0.1 10*3/uL (ref 0.0–0.7)
EOS PCT: 0.6 % (ref 0.0–5.0)
HCT: 35.5 % — ABNORMAL LOW (ref 39.0–52.0)
Hemoglobin: 11.5 g/dL — ABNORMAL LOW (ref 13.0–17.0)
LYMPHS ABS: 2.5 10*3/uL (ref 0.7–4.0)
Lymphocytes Relative: 20.8 % (ref 12.0–46.0)
MCHC: 32.3 g/dL (ref 30.0–36.0)
MCV: 86.3 fl (ref 78.0–100.0)
MONO ABS: 0.7 10*3/uL (ref 0.1–1.0)
Monocytes Relative: 5.8 % (ref 3.0–12.0)
NEUTROS PCT: 72.5 % (ref 43.0–77.0)
Neutro Abs: 8.7 10*3/uL — ABNORMAL HIGH (ref 1.4–7.7)
Platelets: 224 10*3/uL (ref 150.0–400.0)
RBC: 4.11 Mil/uL — AB (ref 4.22–5.81)
RDW: 13.4 % (ref 11.5–15.5)
WBC: 12 10*3/uL — ABNORMAL HIGH (ref 4.0–10.5)

## 2013-10-03 LAB — BASIC METABOLIC PANEL
BUN: 26 mg/dL — ABNORMAL HIGH (ref 6–23)
CHLORIDE: 103 meq/L (ref 96–112)
CO2: 28 meq/L (ref 19–32)
Calcium: 9.7 mg/dL (ref 8.4–10.5)
Creatinine, Ser: 1.4 mg/dL (ref 0.4–1.5)
GFR: 64.76 mL/min (ref >60.00)
Glucose, Bld: 103 mg/dL — ABNORMAL HIGH (ref 70–99)
Potassium: 4.2 mEq/L (ref 3.5–5.1)
SODIUM: 137 meq/L (ref 135–145)

## 2013-10-03 LAB — PSA: PSA: 10.84 ng/mL — ABNORMAL HIGH (ref 0.10–4.00)

## 2013-10-03 NOTE — Progress Notes (Signed)
Subjective:     F/u penis hurts x 5 d, rash, rash on neck - resolved  Predn and cream F/u  R hand pain and swelling - chronic, better Now is taking Uloric and Colchicine - better.  Predn 10 mg works the best for rash and gout F/u HTN F/u prostate ca (Dr [name]) C/o SOB when climbing steps x 2-3 wks; no CP   HPI   BP Readings from Last 3 Encounters:  10/03/13 150/84  08/10/13 153/80  08/03/13 130/64   Wt Readings from Last 3 Encounters:  10/03/13 238 lb (107.956 kg)  08/03/13 237 lb (107.502 kg)  06/21/13 238 lb (107.956 kg)      Review of Systems  Constitutional: Positive for fatigue. Negative for appetite change and unexpected weight change.  HENT: Negative for congestion, nosebleeds and sneezing.   Eyes: Negative for itching and visual disturbance.  Cardiovascular: Negative for leg swelling.  Gastrointestinal: Negative for diarrhea, blood in stool and abdominal distention.  Genitourinary: Negative for frequency and hematuria.  Musculoskeletal: Positive for arthralgias, gait problem and myalgias. Negative for joint swelling.  Skin: Positive for rash.  Neurological: Negative for tremors and speech difficulty.  Psychiatric/Behavioral: Negative for suicidal ideas, sleep disturbance, dysphoric mood and agitation. The patient is not nervous/anxious.    BP Readings from Last 3 Encounters:  10/03/13 150/84  08/10/13 153/80  08/03/13 130/64   Wt Readings from Last 3 Encounters:  10/03/13 238 lb (107.956 kg)  08/03/13 237 lb (107.502 kg)  06/21/13 238 lb (107.956 kg)        Objective:   Physical Exam  Constitutional: He is oriented to person, place, and time. He appears well-developed.  HENT:  Mouth/Throat: Oropharynx is clear and moist.  Eyes: Conjunctivae are normal. Pupils are equal, round, and reactive to light.  Neck: Normal range of motion. No JVD present. No thyromegaly present.  Cardiovascular: Normal rate, regular rhythm, normal heart sounds and intact  distal pulses.  Exam reveals no gallop and no friction rub.   No murmur heard. Pulmonary/Chest: Effort normal and breath sounds normal. No respiratory distress. He has no wheezes. He has no rales. He exhibits no tenderness.  Abdominal: Soft. Bowel sounds are normal. He exhibits no distension and no mass. There is no tenderness. There is no rebound and no guarding.  Musculoskeletal: Normal range of motion. He exhibits no edema and no tenderness.  R 2-3d MCP is not swollen and not tender  Lymphadenopathy:    He has no cervical adenopathy.  Neurological: He is alert and oriented to person, place, and time. He has normal reflexes. No cranial nerve deficit. He exhibits normal muscle tone. Coordination normal.  Skin: Skin is warm and dry. Rash noted.  Hands w/numular eczema rash - dry 2 cm and round - better Balanitis   Psychiatric: He has a normal mood and affect. His behavior is normal. Judgment and thought content normal.  no rash on neck  Lab Results  Component Value Date   WBC 5.6 07/31/2011   HGB 11.9* 07/31/2011   HCT 35.0* 07/31/2011   PLT 186 07/31/2011   GLUCOSE 117* 08/03/2013   CHOL 180 10/26/2010   TRIG 102 10/26/2010   HDL 35* 10/26/2010   LDLCALC 125* 10/26/2010   ALT 35 08/03/2013   AST 25 08/03/2013   NA 137 08/03/2013   K 4.6 08/03/2013   CL 103 08/03/2013   CREATININE 1.4 08/03/2013   BUN 20 08/03/2013   CO2 26 08/03/2013   TSH 2.223  10/26/2010   PSA 6.79* 05/25/2013   INR 1.01 10/25/2010   HGBA1C 5.5 08/03/2013   EKG  A complex case      Assessment & Plan:

## 2013-10-03 NOTE — Assessment & Plan Note (Addendum)
On 10 mg Predn/d  Potential benefits of a long term steroid  use as well as potential risks  and complications were explained to the patient and were aknowledged.

## 2013-10-03 NOTE — Assessment & Plan Note (Signed)
PSA Not on XRT

## 2013-10-03 NOTE — Progress Notes (Signed)
Pre visit review using our clinic review tool, if applicable. No additional management support is needed unless otherwise documented below in the visit note. 

## 2013-10-03 NOTE — Assessment & Plan Note (Signed)
7/15 mild ?deconditioning EKG ECHO

## 2013-10-03 NOTE — Assessment & Plan Note (Signed)
Continue with current prescription therapy as reflected on the Med list.  

## 2013-10-17 ENCOUNTER — Ambulatory Visit (HOSPITAL_COMMUNITY): Payer: Medicare Other | Attending: Cardiology | Admitting: Cardiology

## 2013-10-17 DIAGNOSIS — R0602 Shortness of breath: Secondary | ICD-10-CM | POA: Diagnosis present

## 2013-10-17 DIAGNOSIS — R0609 Other forms of dyspnea: Secondary | ICD-10-CM

## 2013-10-17 DIAGNOSIS — I517 Cardiomegaly: Secondary | ICD-10-CM | POA: Diagnosis not present

## 2013-10-17 NOTE — Progress Notes (Signed)
Echo performed. 

## 2013-11-13 ENCOUNTER — Ambulatory Visit: Payer: Medicare Other

## 2013-11-22 ENCOUNTER — Other Ambulatory Visit: Payer: Self-pay | Admitting: *Deleted

## 2013-11-22 MED ORDER — LABETALOL HCL 200 MG PO TABS: 200.0000 mg | ORAL_TABLET | Freq: Three times a day (TID) | ORAL | Status: DC

## 2013-12-05 ENCOUNTER — Encounter: Payer: Self-pay | Admitting: Internal Medicine

## 2013-12-05 ENCOUNTER — Other Ambulatory Visit (INDEPENDENT_AMBULATORY_CARE_PROVIDER_SITE_OTHER): Payer: Medicare Other

## 2013-12-05 ENCOUNTER — Ambulatory Visit (INDEPENDENT_AMBULATORY_CARE_PROVIDER_SITE_OTHER): Payer: Medicare Other | Admitting: Internal Medicine

## 2013-12-05 VITALS — BP 148/82 | HR 72 | Temp 98.1°F | Wt 241.0 lb

## 2013-12-05 DIAGNOSIS — Z23 Encounter for immunization: Secondary | ICD-10-CM

## 2013-12-05 DIAGNOSIS — R739 Hyperglycemia, unspecified: Secondary | ICD-10-CM

## 2013-12-05 DIAGNOSIS — E785 Hyperlipidemia, unspecified: Secondary | ICD-10-CM

## 2013-12-05 DIAGNOSIS — Z8546 Personal history of malignant neoplasm of prostate: Secondary | ICD-10-CM

## 2013-12-05 DIAGNOSIS — I674 Hypertensive encephalopathy: Secondary | ICD-10-CM

## 2013-12-05 DIAGNOSIS — I6529 Occlusion and stenosis of unspecified carotid artery: Secondary | ICD-10-CM

## 2013-12-05 DIAGNOSIS — I6523 Occlusion and stenosis of bilateral carotid arteries: Secondary | ICD-10-CM

## 2013-12-05 DIAGNOSIS — R7309 Other abnormal glucose: Secondary | ICD-10-CM

## 2013-12-05 DIAGNOSIS — R55 Syncope and collapse: Secondary | ICD-10-CM

## 2013-12-05 DIAGNOSIS — I658 Occlusion and stenosis of other precerebral arteries: Secondary | ICD-10-CM

## 2013-12-05 DIAGNOSIS — R21 Rash and other nonspecific skin eruption: Secondary | ICD-10-CM

## 2013-12-05 LAB — LIPID PANEL
CHOLESTEROL: 208 mg/dL — AB (ref 0–200)
HDL: 35.3 mg/dL — ABNORMAL LOW (ref >39.00)
LDL Cholesterol: 151 mg/dL — ABNORMAL HIGH (ref 0–99)
NonHDL: 172.7
TRIGLYCERIDES: 111 mg/dL (ref 0.0–149.0)
Total CHOL/HDL Ratio: 6
VLDL: 22.2 mg/dL (ref 0.0–40.0)

## 2013-12-05 LAB — BASIC METABOLIC PANEL
BUN: 22 mg/dL (ref 6–23)
CALCIUM: 9.4 mg/dL (ref 8.4–10.5)
CO2: 25 meq/L (ref 19–32)
CREATININE: 1.5 mg/dL (ref 0.4–1.5)
Chloride: 108 mEq/L (ref 96–112)
GFR: 57.9 mL/min — AB (ref >60.00)
GLUCOSE: 119 mg/dL — AB (ref 70–99)
Potassium: 4.6 mEq/L (ref 3.5–5.1)
Sodium: 141 mEq/L (ref 135–145)

## 2013-12-05 LAB — HEPATIC FUNCTION PANEL
ALT: 25 U/L (ref 0–53)
AST: 26 U/L (ref 0–37)
Albumin: 4.2 g/dL (ref 3.5–5.2)
Alkaline Phosphatase: 98 U/L (ref 39–117)
Bilirubin, Direct: 0.1 mg/dL (ref 0.0–0.3)
TOTAL PROTEIN: 6.9 g/dL (ref 6.0–8.3)
Total Bilirubin: 0.8 mg/dL (ref 0.2–1.2)

## 2013-12-05 LAB — HEMOGLOBIN A1C: Hgb A1c MFr Bld: 5.2 % (ref 4.6–6.5)

## 2013-12-05 LAB — PSA: PSA: 9.31 ng/mL — ABNORMAL HIGH (ref 0.10–4.00)

## 2013-12-05 NOTE — Assessment & Plan Note (Signed)
Lipids 

## 2013-12-05 NOTE — Assessment & Plan Note (Signed)
Labs

## 2013-12-05 NOTE — Progress Notes (Signed)
Subjective:     F/u penis hurts x 5 d, rash, rash on neck - resolved  Predn and cream F/u  R hand pain and swelling - chronic, better Now is taking Uloric and Colchicine - better.  Predn 10 mg works the best for rash and gout F/u HTN F/u prostate ca (Dr [name]) F/u SOB when climbing steps issue - resolved   HPI   BP Readings from Last 3 Encounters:  12/05/13 148/82  10/03/13 150/84  08/10/13 153/80   Wt Readings from Last 3 Encounters:  12/05/13 241 lb (109.317 kg)  10/03/13 238 lb (107.956 kg)  08/03/13 237 lb (107.502 kg)      Review of Systems  Constitutional: Positive for fatigue. Negative for appetite change and unexpected weight change.  HENT: Negative for congestion, nosebleeds and sneezing.   Eyes: Negative for itching and visual disturbance.  Cardiovascular: Negative for leg swelling.  Gastrointestinal: Negative for diarrhea, blood in stool and abdominal distention.  Genitourinary: Negative for frequency and hematuria.  Musculoskeletal: Positive for arthralgias, gait problem and myalgias. Negative for joint swelling.  Skin: Positive for rash.  Neurological: Negative for tremors and speech difficulty.  Psychiatric/Behavioral: Negative for suicidal ideas, sleep disturbance, dysphoric mood and agitation. The patient is not nervous/anxious.    BP Readings from Last 3 Encounters:  12/05/13 148/82  10/03/13 150/84  08/10/13 153/80   Wt Readings from Last 3 Encounters:  12/05/13 241 lb (109.317 kg)  10/03/13 238 lb (107.956 kg)  08/03/13 237 lb (107.502 kg)        Objective:   Physical Exam  Constitutional: He is oriented to person, place, and time. He appears well-developed.  HENT:  Mouth/Throat: Oropharynx is clear and moist.  Eyes: Conjunctivae are normal. Pupils are equal, round, and reactive to light.  Neck: Normal range of motion. No JVD present. No thyromegaly present.  Cardiovascular: Normal rate, regular rhythm, normal heart sounds and intact  distal pulses.  Exam reveals no gallop and no friction rub.   No murmur heard. Pulmonary/Chest: Effort normal and breath sounds normal. No respiratory distress. He has no wheezes. He has no rales. He exhibits no tenderness.  Abdominal: Soft. Bowel sounds are normal. He exhibits no distension and no mass. There is no tenderness. There is no rebound and no guarding.  Musculoskeletal: Normal range of motion. He exhibits no edema and no tenderness.  R 2-3d MCP is not swollen and not tender  Lymphadenopathy:    He has no cervical adenopathy.  Neurological: He is alert and oriented to person, place, and time. He has normal reflexes. No cranial nerve deficit. He exhibits normal muscle tone. Coordination normal.  Skin: Skin is warm and dry. Rash noted.  Hands w/numular eczema rash - dry 2 cm and round - better Balanitis   Psychiatric: He has a normal mood and affect. His behavior is normal. Judgment and thought content normal.  no rash on neck  Lab Results  Component Value Date   WBC 12.0* 10/03/2013   HGB 11.5* 10/03/2013   HCT 35.5* 10/03/2013   PLT 224.0 10/03/2013   GLUCOSE 103* 10/03/2013   CHOL 180 10/26/2010   TRIG 102 10/26/2010   HDL 35* 10/26/2010   LDLCALC 125* 10/26/2010   ALT 35 08/03/2013   AST 25 08/03/2013   NA 137 10/03/2013   K 4.2 10/03/2013   CL 103 10/03/2013   CREATININE 1.4 10/03/2013   BUN 26* 10/03/2013   CO2 28 10/03/2013   TSH 2.223 10/26/2010  PSA 10.84* 10/03/2013   INR 1.01 10/25/2010   HGBA1C 5.5 08/03/2013        Assessment & Plan:

## 2013-12-05 NOTE — Assessment & Plan Note (Signed)
Continue with current prescription therapy as reflected on the Med list.  

## 2013-12-05 NOTE — Assessment & Plan Note (Signed)
No relapse 

## 2013-12-05 NOTE — Assessment & Plan Note (Signed)
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 Steroid responsive

## 2013-12-05 NOTE — Assessment & Plan Note (Signed)
PSA

## 2013-12-05 NOTE — Progress Notes (Signed)
Pre visit review using our clinic review tool, if applicable. No additional management support is needed unless otherwise documented below in the visit note. 

## 2013-12-12 ENCOUNTER — Other Ambulatory Visit: Payer: Self-pay | Admitting: Dermatology

## 2014-03-06 ENCOUNTER — Ambulatory Visit: Payer: Medicare Other | Admitting: Internal Medicine

## 2014-03-21 ENCOUNTER — Other Ambulatory Visit: Payer: Self-pay | Admitting: Internal Medicine

## 2014-03-27 ENCOUNTER — Encounter: Payer: Self-pay | Admitting: Internal Medicine

## 2014-03-27 ENCOUNTER — Ambulatory Visit (INDEPENDENT_AMBULATORY_CARE_PROVIDER_SITE_OTHER): Payer: Medicare Other | Admitting: Internal Medicine

## 2014-03-27 ENCOUNTER — Other Ambulatory Visit (INDEPENDENT_AMBULATORY_CARE_PROVIDER_SITE_OTHER): Payer: Medicare Other

## 2014-03-27 VITALS — BP 142/70 | HR 80 | Temp 97.6°F | Wt 238.0 lb

## 2014-03-27 DIAGNOSIS — Z8546 Personal history of malignant neoplasm of prostate: Secondary | ICD-10-CM

## 2014-03-27 DIAGNOSIS — R739 Hyperglycemia, unspecified: Secondary | ICD-10-CM

## 2014-03-27 DIAGNOSIS — E785 Hyperlipidemia, unspecified: Secondary | ICD-10-CM

## 2014-03-27 DIAGNOSIS — C61 Malignant neoplasm of prostate: Secondary | ICD-10-CM

## 2014-03-27 DIAGNOSIS — Z23 Encounter for immunization: Secondary | ICD-10-CM

## 2014-03-27 DIAGNOSIS — I674 Hypertensive encephalopathy: Secondary | ICD-10-CM | POA: Diagnosis not present

## 2014-03-27 LAB — BASIC METABOLIC PANEL
BUN: 27 mg/dL — ABNORMAL HIGH (ref 6–23)
CO2: 23 mEq/L (ref 19–32)
CREATININE: 1.4 mg/dL (ref 0.4–1.5)
Calcium: 9.1 mg/dL (ref 8.4–10.5)
Chloride: 103 mEq/L (ref 96–112)
GFR: 65.78 mL/min (ref >60.00)
GLUCOSE: 66 mg/dL — AB (ref 70–99)
POTASSIUM: 4 meq/L (ref 3.5–5.1)
SODIUM: 134 meq/L — AB (ref 135–145)

## 2014-03-27 LAB — HEMOGLOBIN A1C: Hgb A1c MFr Bld: 5.2 % (ref 4.6–6.5)

## 2014-03-27 MED ORDER — LABETALOL HCL 200 MG PO TABS: 200.0000 mg | ORAL_TABLET | Freq: Three times a day (TID) | ORAL | Status: DC

## 2014-03-27 MED ORDER — ATORVASTATIN CALCIUM 10 MG PO TABS: 10.0000 mg | ORAL_TABLET | Freq: Every day | ORAL | Status: DC

## 2014-03-27 NOTE — Progress Notes (Signed)
Subjective:      F/u  R hand pain and swelling - chronic, better Now is taking Uloric and Colchicine - better.  Predn 10 mg works the best for rash and gout F/u HTN F/u prostate ca (Dr [name]) F/u SOB when climbing steps issue - resolved   HPI   BP Readings from Last 3 Encounters:  03/27/14 142/70  12/05/13 148/82  10/03/13 150/84   Wt Readings from Last 3 Encounters:  03/27/14 238 lb (107.956 kg)  12/05/13 241 lb (109.317 kg)  10/03/13 238 lb (107.956 kg)      Review of Systems  Constitutional: Positive for fatigue. Negative for appetite change and unexpected weight change.  HENT: Negative for congestion, nosebleeds and sneezing.   Eyes: Negative for itching and visual disturbance.  Cardiovascular: Negative for leg swelling.  Gastrointestinal: Negative for diarrhea, blood in stool and abdominal distention.  Genitourinary: Negative for frequency and hematuria.  Musculoskeletal: Positive for myalgias, arthralgias and gait problem. Negative for joint swelling.  Skin: Positive for rash.  Neurological: Negative for tremors and speech difficulty.  Psychiatric/Behavioral: Negative for suicidal ideas, sleep disturbance, dysphoric mood and agitation. The patient is not nervous/anxious.    BP Readings from Last 3 Encounters:  03/27/14 142/70  12/05/13 148/82  10/03/13 150/84   Wt Readings from Last 3 Encounters:  03/27/14 238 lb (107.956 kg)  12/05/13 241 lb (109.317 kg)  10/03/13 238 lb (107.956 kg)        Objective:   Physical Exam  Constitutional: He is oriented to person, place, and time. He appears well-developed. No distress.  NAD  HENT:  Mouth/Throat: Oropharynx is clear and moist.  Eyes: Conjunctivae are normal. Pupils are equal, round, and reactive to light.  Neck: Normal range of motion. No JVD present. No thyromegaly present.  Cardiovascular: Normal rate, regular rhythm, normal heart sounds and intact distal pulses.  Exam reveals no gallop and no  friction rub.   No murmur heard. Pulmonary/Chest: Effort normal and breath sounds normal. No respiratory distress. He has no wheezes. He has no rales. He exhibits no tenderness.  Abdominal: Soft. Bowel sounds are normal. He exhibits no distension and no mass. There is no tenderness. There is no rebound and no guarding.  Musculoskeletal: Normal range of motion. He exhibits no edema or tenderness.  Lymphadenopathy:    He has no cervical adenopathy.  Neurological: He is alert and oriented to person, place, and time. He has normal reflexes. No cranial nerve deficit. He exhibits normal muscle tone. He displays a negative Romberg sign. Coordination and gait normal.  No meningeal signs  Skin: Skin is warm and dry. No rash noted.  Psychiatric: He has a normal mood and affect. His behavior is normal. Judgment and thought content normal.  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

## 2014-03-27 NOTE — Assessment & Plan Note (Signed)
Continue with current prescription therapy as reflected on the Med list. BP Readings from Last 3 Encounters:  03/27/14 142/70  12/05/13 148/82  10/03/13 150/84

## 2014-03-27 NOTE — Progress Notes (Signed)
Pre visit review using our clinic review tool, if applicable. No additional management support is needed unless otherwise documented below in the visit note. 

## 2014-03-27 NOTE — Assessment & Plan Note (Signed)
No relapse 

## 2014-03-27 NOTE — Assessment & Plan Note (Signed)
PSA F/u w/Dr [name]

## 2014-03-27 NOTE — Assessment & Plan Note (Signed)
Will try Lipitor if tolerated

## 2014-03-27 NOTE — Assessment & Plan Note (Signed)
Dr [name] q 6 mo

## 2014-03-27 NOTE — Assessment & Plan Note (Signed)
Labs

## 2014-04-25 ENCOUNTER — Other Ambulatory Visit: Payer: Self-pay | Admitting: Internal Medicine

## 2014-05-08 ENCOUNTER — Telehealth: Payer: Self-pay | Admitting: Internal Medicine

## 2014-05-08 NOTE — Telephone Encounter (Signed)
Stay on 100 mg po qd Thx

## 2014-05-08 NOTE — Telephone Encounter (Signed)
Pt called in said that he has been taking 2 tab 3 times daily for 3 years of the allopurinol (ZYLOPRIM) 100 MG tablet [[id number]] .  He just realize that in jan it was called in for 1 tab 3 times daily.  He wants to know if this is correct and if so why was it changed and he has been taking the reg dose so now he is out of this medication.

## 2014-05-09 NOTE — Telephone Encounter (Signed)
Notified pt with md response.../lmb 

## 2014-05-29 DIAGNOSIS — N400 Enlarged prostate without lower urinary tract symptoms: Secondary | ICD-10-CM | POA: Diagnosis not present

## 2014-05-29 DIAGNOSIS — N2581 Secondary hyperparathyroidism of renal origin: Secondary | ICD-10-CM | POA: Diagnosis not present

## 2014-05-29 DIAGNOSIS — N189 Chronic kidney disease, unspecified: Secondary | ICD-10-CM | POA: Diagnosis not present

## 2014-05-29 DIAGNOSIS — N183 Chronic kidney disease, stage 3 (moderate): Secondary | ICD-10-CM | POA: Diagnosis not present

## 2014-05-29 DIAGNOSIS — E785 Hyperlipidemia, unspecified: Secondary | ICD-10-CM | POA: Diagnosis not present

## 2014-05-29 DIAGNOSIS — D631 Anemia in chronic kidney disease: Secondary | ICD-10-CM | POA: Diagnosis not present

## 2014-07-01 ENCOUNTER — Encounter: Payer: Self-pay | Admitting: Family

## 2014-07-01 ENCOUNTER — Ambulatory Visit (INDEPENDENT_AMBULATORY_CARE_PROVIDER_SITE_OTHER): Payer: Medicare Other | Admitting: Family

## 2014-07-01 VITALS — BP 164/82 | HR 101 | Temp 100.1°F | Resp 18 | Ht 76.5 in | Wt 232.8 lb

## 2014-07-01 DIAGNOSIS — L03115 Cellulitis of right lower limb: Secondary | ICD-10-CM

## 2014-07-01 DIAGNOSIS — L0390 Cellulitis, unspecified: Secondary | ICD-10-CM | POA: Insufficient documentation

## 2014-07-01 MED ORDER — SULFAMETHOXAZOLE-TRIMETHOPRIM 800-160 MG PO TABS: 1.0000 | ORAL_TABLET | Freq: Two times a day (BID) | ORAL | Status: DC

## 2014-07-01 NOTE — Patient Instructions (Addendum)
Thank you for choosing [facility].  Summary/Instructions:  Your prescription(s) have been submitted to your pharmacy or been printed and provided for you. Please take as directed and contact our office if you believe you are having problem(s) with the medication(s) or have any questions.  If your symptoms worsen or fail to improve, please contact our office for further instruction, or in case of emergency go directly to the emergency room at the closest medical facility.   Soak in betadine if going to soak the foot.    Cellulitis Cellulitis is an infection of the skin and the tissue beneath it. The infected area is usually red and tender. Cellulitis occurs most often in the arms and lower legs.  CAUSES  Cellulitis is caused by bacteria that enter the skin through cracks or cuts in the skin. The most common types of bacteria that cause cellulitis are staphylococci and streptococci. SIGNS AND SYMPTOMS   Redness and warmth.  Swelling.  Tenderness or pain.  Fever. DIAGNOSIS  Your health care provider can usually determine what is wrong based on a physical exam. Blood tests may also be done. TREATMENT  Treatment usually involves taking an antibiotic medicine. HOME CARE INSTRUCTIONS   Take your antibiotic medicine as directed by your health care provider. Finish the antibiotic even if you start to feel better.  Keep the infected arm or leg elevated to reduce swelling.  Apply a warm cloth to the affected area up to 4 times per day to relieve pain.  Take medicines only as directed by your health care provider.  Keep all follow-up visits as directed by your health care provider. SEEK MEDICAL CARE IF:   You notice red streaks coming from the infected area.  Your red area gets larger or turns dark in color.  Your bone or joint underneath the infected area becomes painful after the skin has healed.  Your infection returns in the same area or another area.  You notice a  swollen bump in the infected area.  You develop new symptoms.  You have a fever. SEEK IMMEDIATE MEDICAL CARE IF:   You feel very sleepy.  You develop vomiting or diarrhea.  You have a general ill feeling (malaise) with muscle aches and pains. MAKE SURE YOU:   Understand these instructions.  Will watch your condition.  Will get help right away if you are not doing well or get worse. Document Released: 12/16/2004 Document Revised: 07/23/2013 Document Reviewed: 05/24/2011 [facility] Patient Information 2015 [place], [place]. This information is not intended to replace advice given to you by your health care provider. Make sure you discuss any questions you have with your health care provider.

## 2014-07-01 NOTE — Progress Notes (Signed)
Pre visit review using our clinic review tool, if applicable. No additional management support is needed unless otherwise documented below in the visit note. 

## 2014-07-01 NOTE — Progress Notes (Signed)
   Subjective:    Patient ID: Kurt Sexton, male    DOB: 10-01-1940, 74 y.o.   MRN: [id number]  Chief Complaint  Patient presents with  . Foot Pain    Having pain in his right foot and ankle area, red and swollen, x3 days of pain, swelling has been going on alot longer    HPI:  Kurt Sexton is a 74 y.o. male who presents today for an acute visit.   This is a new problem. Associated symptom of pain, redness and swelling located in his right foot has been going on for about 3 days. Denies any recent trauma. Modifying factors include soaking it in epson salt which has not helped very much. Pain is described as sharp with an intensity of pain is a 10/10.   Allergies  Allergen Reactions  . Amlodipine Besylate   . Cozaar     Severe headache  . Diazepam   . Doxazosin Mesylate   . Lisinopril     REACTION: HA  . Ramipril     REACTION: HA    Current Outpatient Prescriptions on File Prior to Visit  Medication Sig Dispense Refill  . aspirin 81 MG EC tablet Take 81 mg by mouth daily.      Marland Kitchen atorvastatin (LIPITOR) 10 MG tablet Take 1 tablet (10 mg total) by mouth daily. 30 tablet 11  . labetalol (NORMODYNE) 200 MG tablet Take 1 tablet (200 mg total) by mouth 3 (three) times daily. 180 tablet 3  . loratadine (CLARITIN) 10 MG tablet Take 10 mg by mouth daily.      Marland Kitchen spironolactone (ALDACTONE) 50 MG tablet take 1 tablet by mouth once daily 90 tablet 3   No current facility-administered medications on file prior to visit.    Past Medical History  Diagnosis Date  . HTN (hypertension)   . Osteoarthritis   . BPH (benign prostatic hyperplasia)     Dr [name]  . CVA (cerebral infarction) 1998  . Vitamin D deficiency   . LBP (low back pain) 2010  . Gout   . Stroke 1998  . High cholesterol   . Prostate cancer 01/2008    gleason 3+4=7, PSA 4.93, vol 34.18 cc     Review of Systems  Constitutional: Positive for fever. Negative for chills.  Skin: Positive for rash.      Objective:      BP 164/82 mmHg  Pulse 101  Temp(Src) 100.1 F (37.8 C) (Oral)  Resp 18  Ht 6' 4.5" (1.943 m)  Wt 232 lb 12.8 oz (105.597 kg)  BMI 27.97 kg/m2  SpO2 97% Nursing note and vital signs reviewed.  Physical Exam  Constitutional: He is oriented to person, place, and time. He appears well-developed and well-nourished. No distress.  Cardiovascular: Normal rate, regular rhythm, normal heart sounds and intact distal pulses.   Pulmonary/Chest: Effort normal and breath sounds normal.  Musculoskeletal:  Right foot - No obvious deformity. Redness and edema throughout ankle and foot noted. Dry patchy skin present throughout. Palpable tenderness across ankle from medial mallelous to lateral malleolus. Increased temperature noted compared to contralateral side. No discharge. Some skin breakdown noted on the dorsum of the foot.   Neurological: He is alert and oriented to person, place, and time.  Skin: Skin is warm and dry.  Psychiatric: He has a normal mood and affect. His behavior is normal. Judgment and thought content normal.       Assessment & Plan:

## 2014-07-01 NOTE — Assessment & Plan Note (Signed)
Exam consistent with cellulitis. Start Bactrim to cover for MRSA infection. Patient instructed to seek further or emergency care if his temperature increases or the pain/redness and infection worsens. Follow up at completion of antibiotics or sooner if needed.

## 2014-07-03 ENCOUNTER — Emergency Department (HOSPITAL_COMMUNITY): Payer: Medicare Other

## 2014-07-03 ENCOUNTER — Emergency Department (HOSPITAL_COMMUNITY)
Admission: EM | Admit: 2014-07-03 | Discharge: 2014-07-03 | Disposition: A | Discharge status: Home / Self Care | Payer: Medicare Other | Attending: Emergency Medicine | Admitting: Emergency Medicine

## 2014-07-03 ENCOUNTER — Encounter (HOSPITAL_COMMUNITY): Payer: Self-pay | Admitting: Physical Medicine and Rehabilitation

## 2014-07-03 DIAGNOSIS — Z7982 Long term (current) use of aspirin: Secondary | ICD-10-CM | POA: Diagnosis not present

## 2014-07-03 DIAGNOSIS — L259 Unspecified contact dermatitis, unspecified cause: Secondary | ICD-10-CM | POA: Insufficient documentation

## 2014-07-03 DIAGNOSIS — Z79899 Other long term (current) drug therapy: Secondary | ICD-10-CM | POA: Insufficient documentation

## 2014-07-03 DIAGNOSIS — Z8673 Personal history of transient ischemic attack (TIA), and cerebral infarction without residual deficits: Secondary | ICD-10-CM | POA: Diagnosis not present

## 2014-07-03 DIAGNOSIS — Z87448 Personal history of other diseases of urinary system: Secondary | ICD-10-CM | POA: Insufficient documentation

## 2014-07-03 DIAGNOSIS — M19071 Primary osteoarthritis, right ankle and foot: Secondary | ICD-10-CM | POA: Diagnosis not present

## 2014-07-03 DIAGNOSIS — Z792 Long term (current) use of antibiotics: Secondary | ICD-10-CM | POA: Insufficient documentation

## 2014-07-03 DIAGNOSIS — I10 Essential (primary) hypertension: Secondary | ICD-10-CM | POA: Insufficient documentation

## 2014-07-03 DIAGNOSIS — M79671 Pain in right foot: Secondary | ICD-10-CM | POA: Insufficient documentation

## 2014-07-03 DIAGNOSIS — R2241 Localized swelling, mass and lump, right lower limb: Secondary | ICD-10-CM | POA: Diagnosis not present

## 2014-07-03 DIAGNOSIS — Z8546 Personal history of malignant neoplasm of prostate: Secondary | ICD-10-CM | POA: Insufficient documentation

## 2014-07-03 DIAGNOSIS — L309 Dermatitis, unspecified: Secondary | ICD-10-CM

## 2014-07-03 DIAGNOSIS — M1990 Unspecified osteoarthritis, unspecified site: Secondary | ICD-10-CM | POA: Diagnosis not present

## 2014-07-03 DIAGNOSIS — M7989 Other specified soft tissue disorders: Secondary | ICD-10-CM

## 2014-07-03 DIAGNOSIS — E785 Hyperlipidemia, unspecified: Secondary | ICD-10-CM | POA: Diagnosis not present

## 2014-07-03 DIAGNOSIS — M7731 Calcaneal spur, right foot: Secondary | ICD-10-CM | POA: Diagnosis not present

## 2014-07-03 LAB — CBC WITH DIFFERENTIAL/PLATELET
Basophils Absolute: 0.1 10*3/uL (ref 0.0–0.1)
Basophils Relative: 1 % (ref 0–1)
Eosinophils Absolute: 0.6 10*3/uL (ref 0.0–0.7)
Eosinophils Relative: 6 % — ABNORMAL HIGH (ref 0–5)
HCT: 33.2 % — ABNORMAL LOW (ref 39.0–52.0)
HEMOGLOBIN: 10.7 g/dL — AB (ref 13.0–17.0)
LYMPHS PCT: 15 % (ref 12–46)
Lymphs Abs: 1.5 10*3/uL (ref 0.7–4.0)
MCH: 27.1 pg (ref 26.0–34.0)
MCHC: 32.2 g/dL (ref 30.0–36.0)
MCV: 84.1 fL (ref 78.0–100.0)
MONO ABS: 0.8 10*3/uL (ref 0.1–1.0)
MONOS PCT: 8 % (ref 3–12)
NEUTROS ABS: 7.3 10*3/uL (ref 1.7–7.7)
Neutrophils Relative %: 70 % (ref 43–77)
Platelets: 284 10*3/uL (ref 150–400)
RBC: 3.95 MIL/uL — ABNORMAL LOW (ref 4.22–5.81)
RDW: 12 % (ref 11.5–15.5)
WBC: 10.2 10*3/uL (ref 4.0–10.5)

## 2014-07-03 LAB — COMPREHENSIVE METABOLIC PANEL
ALT: 47 U/L (ref 0–53)
AST: 39 U/L — AB (ref 0–37)
Albumin: 3.9 g/dL (ref 3.5–5.2)
Alkaline Phosphatase: 137 U/L — ABNORMAL HIGH (ref 39–117)
Anion gap: 10 (ref 5–15)
BUN: 24 mg/dL — ABNORMAL HIGH (ref 6–23)
CALCIUM: 9.2 mg/dL (ref 8.4–10.5)
CHLORIDE: 97 mmol/L (ref 96–112)
CO2: 25 mmol/L (ref 19–32)
Creatinine, Ser: 1.79 mg/dL — ABNORMAL HIGH (ref 0.50–1.35)
GFR calc non Af Amer: 36 mL/min — ABNORMAL LOW (ref >90)
GFR, EST AFRICAN AMERICAN: 41 mL/min — AB (ref >90)
GLUCOSE: 107 mg/dL — AB (ref 70–99)
Potassium: 4.8 mmol/L (ref 3.5–5.1)
Sodium: 132 mmol/L — ABNORMAL LOW (ref 135–145)
Total Bilirubin: 1.2 mg/dL (ref 0.3–1.2)
Total Protein: 7.3 g/dL (ref 6.0–8.3)

## 2014-07-03 MED ORDER — HYDROCODONE-ACETAMINOPHEN 5-325 MG PO TABS: 1.0000 | ORAL_TABLET | ORAL | Status: DC | PRN

## 2014-07-03 MED ORDER — HYDROCODONE-ACETAMINOPHEN 5-325 MG PO TABS
1.0000 | ORAL_TABLET | Freq: Once | ORAL | Status: AC
  Administered 2014-07-03: 1 via ORAL
  Filled 2014-07-03: qty 1

## 2014-07-03 NOTE — ED Notes (Signed)
Pt presents to department for evaluation of R foot pain. Pt reports pain and swelling to R foot, was started on Bactrim by PCP on Monday, no relief. Able to wiggle digits, pedal pulses present. Pt is alert and oriented x4.

## 2014-07-03 NOTE — ED Provider Notes (Signed)
CSN: [id number]     Arrival date & time 07/03/14  1614 History   First MD Initiated Contact with Patient 07/03/14 1852     Chief Complaint  Patient presents with  . Foot Pain     (Consider location/radiation/quality/duration/timing/severity/associated sxs/prior Treatment) Patient is a 74 y.o. male presenting with lower extremity pain. The history is provided by the patient. No language interpreter was used.  Foot Pain This is a new problem. The current episode started yesterday. The problem occurs constantly. The problem has been gradually worsening. Associated symptoms include a rash. Pertinent negatives include no abdominal pain, anorexia, chest pain, chills, coughing, diaphoresis, fatigue, fever, nausea, numbness, vertigo, vomiting or weakness. The symptoms are aggravated by walking and standing. He has tried relaxation for the symptoms. The treatment provided no relief.    Past Medical History  Diagnosis Date  . HTN (hypertension)   . Osteoarthritis   . BPH (benign prostatic hyperplasia)     Dr [name]  . CVA (cerebral infarction) 1998  . Vitamin D deficiency   . LBP (low back pain) 2010  . Gout   . Stroke 1998  . High cholesterol   . Prostate cancer 01/2008    gleason 3+4=7, PSA 4.93, vol 34.18 cc   Past Surgical History  Procedure Laterality Date  . Tonsillectomy    . Hernia repair      x2  . Knee arthroscopy w/ pcl and lcl  repair & tendon graft    . Correction hammer toe    . Prostate biopsy  01/2008    gleason 7  . Prostate biopsy  03/27/13    gleason 3+4=7, volume 34.18 cc   Family History  Problem Relation Age of Onset  . Diabetes Other     1st degree relative  . Hypertension Other   . Cancer Brother     prostate, tx w/xrt   History  Substance Use Topics  . Smoking status: Never Smoker   . Smokeless tobacco: Never Used  . Alcohol Use: No    Review of Systems  Constitutional: Negative for fever, chills, diaphoresis and fatigue.  Respiratory: Negative  for cough, chest tightness and shortness of breath.   Cardiovascular: Negative for chest pain.  Gastrointestinal: Negative for nausea, vomiting, abdominal pain and anorexia.  Skin: Positive for color change (feet) and rash.  Neurological: Negative for vertigo, weakness, light-headedness and numbness.  Psychiatric/Behavioral: Negative for confusion.  All other systems reviewed and are negative.     Allergies  Amlodipine besylate; Cozaar; Diazepam; Doxazosin mesylate; Lisinopril; and Ramipril  Home Medications   Prior to Admission medications   Medication Sig Start Date End Date Taking? Authorizing Provider  aspirin 81 MG EC tablet Take 81 mg by mouth daily.     Yes Historical Provider, MD  labetalol (NORMODYNE) 200 MG tablet Take 1 tablet (200 mg total) by mouth 3 (three) times daily. 03/27/14  Yes [name], MD  loratadine (CLARITIN) 10 MG tablet Take 10 mg by mouth daily.     Yes Historical Provider, MD  spironolactone (ALDACTONE) 50 MG tablet take 1 tablet by mouth once daily 04/25/14  Yes [name], MD  sulfamethoxazole-trimethoprim (BACTRIM DS,SEPTRA DS) 800-160 MG per tablet Take 1 tablet by mouth 2 (two) times daily. 07/01/14  Yes Golden Circle, FNP  atorvastatin (LIPITOR) 10 MG tablet Take 1 tablet (10 mg total) by mouth daily. 03/27/14   [name], MD  HYDROcodone-acetaminophen (NORCO) 5-325 MG per tablet Take 1 tablet by  mouth every 4 (four) hours as needed. 07/03/14   Sherwood Gambler, MD   BP 149/59 mmHg  Pulse 73  Temp(Src) 98.2 F (36.8 C)  Resp 17  SpO2 100% Physical Exam  Constitutional: He is oriented to person, place, and time. He appears well-developed and well-nourished. No distress.  HENT:  Head: Normocephalic and atraumatic.  Nose: Nose normal.  Mouth/Throat: Oropharynx is clear and moist. No oropharyngeal exudate.  Eyes: EOM are normal. Pupils are equal, round, and reactive to light.  Neck: Normal range of motion. Neck supple.    Cardiovascular: Normal rate, regular rhythm, normal heart sounds and intact distal pulses.   No murmur heard. Pulmonary/Chest: Effort normal and breath sounds normal. No respiratory distress. He has no wheezes. He exhibits no tenderness.  Abdominal: Soft. He exhibits no distension. There is no tenderness. There is no guarding.  Musculoskeletal: Normal range of motion. He exhibits tenderness (rigth foot).       Feet:  Small callous on plantar aspect near 3rd MTP but no acute or inflamed appearing lesions on plantar surface.  Significantly more swollen on right foot/ankle than left.   No right calf tenderness or cords palpated  Neurological: He is alert and oriented to person, place, and time. No cranial nerve deficit. Coordination normal.  Skin: Skin is warm and dry. Rash noted. He is not diaphoretic. No pallor.  Psychiatric: He has a normal mood and affect. His behavior is normal. Judgment and thought content normal.  Nursing note and vitals reviewed.   ED Course  Procedures (including critical care time) Labs Review Labs Reviewed  CBC WITH DIFFERENTIAL/PLATELET - Abnormal; Notable for the following:    RBC 3.95 (*)    Hemoglobin 10.7 (*)    HCT 33.2 (*)    Eosinophils Relative 6 (*)    All other components within normal limits  COMPREHENSIVE METABOLIC PANEL - Abnormal; Notable for the following:    Sodium 132 (*)    Glucose, Bld 107 (*)    BUN 24 (*)    Creatinine, Ser 1.79 (*)    AST 39 (*)    Alkaline Phosphatase 137 (*)    GFR calc non Af Amer 36 (*)    GFR calc Af Amer 41 (*)    All other components within normal limits    Imaging Review Dg Ankle Complete Right  07/03/2014   CLINICAL DATA:  Pain and swelling for 6 days.  No known injury  EXAM: RIGHT ANKLE - COMPLETE 3+ VIEW  COMPARISON:  September 07, 2011  FINDINGS: Frontal, oblique, and lateral views obtained. There is generalized soft tissue swelling. There is no fracture or joint effusion. The ankle mortise appears  intact. There are small spurs arising from the posterior and inferior calcaneus. There is mild generalized joint space narrowing.  IMPRESSION: Osteoarthritic change. Calcaneal spurs. Generalized soft tissue swelling. No acute fracture. Mortise intact.   Electronically Signed   By: Lowella Grip III M.D.   On: 07/03/2014 19:57   Dg Foot Complete Right  07/03/2014   CLINICAL DATA:  Foot pain evaluate for osteomyelitis  EXAM: RIGHT FOOT COMPLETE - 3+ VIEW  COMPARISON:  None.  FINDINGS: There is a hallux valgus deformity. Degenerative changes are noted at the first MTP joint. There is mild diffuse soft tissue swelling. Small plantar heel spur noted. No radio-opaque foreign bodies are soft tissue calcifications.  IMPRESSION: 1. No evidence for osteomyelitis. 2. Soft tissue swelling 3. Degenerative changes at the first MTP joint.   Electronically  Signed   By: Kerby Moors M.D.   On: 07/03/2014 19:59     EKG Interpretation None      MDM   Final diagnoses:  Chronic dermatitis  Right foot pain  Right leg swelling   Patient is a 74 yo M with hx of HTN, CVA, and gout who presents with right foot pain.  Has had a rash on bilateral feet for the past few weeks.  Dry scaling skin and hyperkeratotic changes consistent with stasis dermatitis.  States it has progressively worsened, which causes increased pain.  Also noted redness and swelling of his right ankle earlier this week.  Seen by PCP 2 days ago and started on bactrim for possible skin infection on right foot.  Presents today because he was scrubbing his scaling skin and developed an abrasion.  Concerned that the pain is worsening and that his abrasion is an acute infection.   Patient is in no acute distress. Afebrile. Able to ambulate on both lower extremities.   Chronic dermatitis skin changes noted on bilateral feet and ankles.  Small right midfoot abrasion at the base of the 4th phalanx.  Diffusely tender on right midfoot throughout.  No pinpoint  pain.  Small callous on plantar aspect near 3rd MTP but no acute or inflamed appearing lesions on plantar surface.  Significantly more swollen on right foot/ankle than left.  No hx of immobilization, no hx of DVT in the past.   Will evaluate with xrays to look for osteo.  Right foot and ankle xrays returned + for soft tissue changes only.   As it is overnight and patient reports his right leg has "always been more swollen" then the left, he has no chest pain or SOB, and vitals are stable, ok to dc home tonight and have patient return in the AM for RLE DVT US.  Patient was advised on the importance of keeping this appointment and he voiced understanding.   Advised to continue the bactrim.  To take tylenol for pain. Encouraged to keep the area clean and to moisturize often.  He has Eucerin cream and we discussed use.  Given ED return precautions and all questions were answered prior to dc home in stable condition.    Patient was seen with ED Attending, Dr. Brynda Greathouse, MD   Tori Milks, MD 07/04/14 [place], MD 07/06/14 2010

## 2014-07-03 NOTE — ED Notes (Signed)
Pt to xray at this time.

## 2014-07-03 NOTE — ED Notes (Signed)
Pt st's he had a rash on his hands then it spread to bil feet.  Pt st's right foot started to swell and become painful.  St's painful to bear weight

## 2014-07-04 ENCOUNTER — Ambulatory Visit (HOSPITAL_COMMUNITY)
Admission: RE | Admit: 2014-07-04 | Discharge: 2014-07-04 | Disposition: A | Discharge status: Home / Self Care | Payer: Medicare Other | Source: Ambulatory Visit | Attending: Emergency Medicine | Admitting: Emergency Medicine

## 2014-07-04 DIAGNOSIS — I82511 Chronic embolism and thrombosis of right femoral vein: Secondary | ICD-10-CM | POA: Diagnosis not present

## 2014-07-04 DIAGNOSIS — M79604 Pain in right leg: Secondary | ICD-10-CM | POA: Diagnosis not present

## 2014-07-04 NOTE — Progress Notes (Signed)
VASCULAR LAB PRELIMINARY  PRELIMINARY  PRELIMINARY  PRELIMINARY  Right lower extremity venous Doppler completed.    Preliminary report:  There is no acute DVT or SVT noted in the right lower extremity. 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 Kherington Meraz, RVT 07/04/2014, 9:53 AM

## 2014-07-10 ENCOUNTER — Ambulatory Visit (INDEPENDENT_AMBULATORY_CARE_PROVIDER_SITE_OTHER): Payer: Medicare Other | Admitting: Internal Medicine

## 2014-07-10 ENCOUNTER — Other Ambulatory Visit (INDEPENDENT_AMBULATORY_CARE_PROVIDER_SITE_OTHER): Payer: Medicare Other

## 2014-07-10 ENCOUNTER — Encounter: Payer: Self-pay | Admitting: Internal Medicine

## 2014-07-10 VITALS — BP 102/52 | HR 73 | Temp 98.0°F | Wt 228.0 lb

## 2014-07-10 DIAGNOSIS — L309 Dermatitis, unspecified: Secondary | ICD-10-CM

## 2014-07-10 DIAGNOSIS — C61 Malignant neoplasm of prostate: Secondary | ICD-10-CM

## 2014-07-10 DIAGNOSIS — L03031 Cellulitis of right toe: Secondary | ICD-10-CM

## 2014-07-10 DIAGNOSIS — E785 Hyperlipidemia, unspecified: Secondary | ICD-10-CM | POA: Diagnosis not present

## 2014-07-10 DIAGNOSIS — I950 Idiopathic hypotension: Secondary | ICD-10-CM

## 2014-07-10 DIAGNOSIS — Z8546 Personal history of malignant neoplasm of prostate: Secondary | ICD-10-CM

## 2014-07-10 DIAGNOSIS — N183 Chronic kidney disease, stage 3 (moderate): Secondary | ICD-10-CM | POA: Diagnosis not present

## 2014-07-10 DIAGNOSIS — I952 Hypotension due to drugs: Secondary | ICD-10-CM | POA: Insufficient documentation

## 2014-07-10 LAB — URINALYSIS
Bilirubin Urine: NEGATIVE
HGB URINE DIPSTICK: NEGATIVE
Ketones, ur: NEGATIVE
Leukocytes, UA: NEGATIVE
Nitrite: NEGATIVE
Specific Gravity, Urine: 1.015 (ref 1.000–1.030)
TOTAL PROTEIN, URINE-UPE24: NEGATIVE
URINE GLUCOSE: NEGATIVE
Urobilinogen, UA: 0.2 (ref 0.0–1.0)
pH: 6 (ref 5.0–8.0)

## 2014-07-10 LAB — BASIC METABOLIC PANEL
BUN: 41 mg/dL — ABNORMAL HIGH (ref 6–23)
CALCIUM: 10 mg/dL (ref 8.4–10.5)
CO2: 23 mEq/L (ref 19–32)
Chloride: 100 mEq/L (ref 96–112)
Creatinine, Ser: 2.42 mg/dL — ABNORMAL HIGH (ref 0.40–1.50)
GFR: 33.8 mL/min — AB (ref >60.00)
GLUCOSE: 110 mg/dL — AB (ref 70–99)
Potassium: 5.9 mEq/L — ABNORMAL HIGH (ref 3.5–5.1)
SODIUM: 130 meq/L — AB (ref 135–145)

## 2014-07-10 LAB — CBC WITH DIFFERENTIAL/PLATELET
BASOS ABS: 0.1 10*3/uL (ref 0.0–0.1)
Basophils Relative: 1.5 % (ref 0.0–3.0)
Eosinophils Absolute: 0.8 10*3/uL — ABNORMAL HIGH (ref 0.0–0.7)
Eosinophils Relative: 12.1 % — ABNORMAL HIGH (ref 0.0–5.0)
HEMATOCRIT: 32.3 % — AB (ref 39.0–52.0)
Hemoglobin: 10.7 g/dL — ABNORMAL LOW (ref 13.0–17.0)
LYMPHS ABS: 1.1 10*3/uL (ref 0.7–4.0)
Lymphocytes Relative: 15.6 % (ref 12.0–46.0)
MCHC: 33 g/dL (ref 30.0–36.0)
MCV: 81 fl (ref 78.0–100.0)
MONO ABS: 1 10*3/uL (ref 0.1–1.0)
Monocytes Relative: 14.7 % — ABNORMAL HIGH (ref 3.0–12.0)
Neutro Abs: 3.9 10*3/uL (ref 1.4–7.7)
Neutrophils Relative %: 56.1 % (ref 43.0–77.0)
PLATELETS: 366 10*3/uL (ref 150.0–400.0)
RBC: 3.99 Mil/uL — ABNORMAL LOW (ref 4.22–5.81)
RDW: 12.8 % (ref 11.5–15.5)
WBC: 6.9 10*3/uL (ref 4.0–10.5)

## 2014-07-10 LAB — HEPATIC FUNCTION PANEL
ALK PHOS: 187 U/L — AB (ref 39–117)
ALT: 36 U/L (ref 0–53)
AST: 26 U/L (ref 0–37)
Albumin: 4.6 g/dL (ref 3.5–5.2)
BILIRUBIN DIRECT: 0.1 mg/dL (ref 0.0–0.3)
Total Bilirubin: 0.5 mg/dL (ref 0.2–1.2)
Total Protein: 7.4 g/dL (ref 6.0–8.3)

## 2014-07-10 LAB — IBC PANEL
Iron: 97 ug/dL (ref 42–165)
SATURATION RATIOS: 26.1 % (ref 20.0–50.0)
Transferrin: 265 mg/dL (ref 212.0–360.0)

## 2014-07-10 LAB — TSH: TSH: 3.17 u[IU]/mL (ref 0.35–4.50)

## 2014-07-10 LAB — PSA: PSA: 13.26 ng/mL — AB (ref 0.10–4.00)

## 2014-07-10 LAB — LIPASE: Lipase: 11 U/L (ref 11.0–59.0)

## 2014-07-10 LAB — CORTISOL: Cortisol, Plasma: 12.4 ug/dL

## 2014-07-10 MED ORDER — HYDROXYZINE HCL 25 MG PO TABS: 25.0000 mg | ORAL_TABLET | Freq: Three times a day (TID) | ORAL | Status: AC | PRN

## 2014-07-10 MED ORDER — METHYLPREDNISOLONE ACETATE 80 MG/ML IJ SUSP
80.0000 mg | Freq: Once | INTRAMUSCULAR | Status: AC
  Administered 2014-07-10: 80 mg via INTRAMUSCULAR

## 2014-07-10 MED ORDER — BETAMETHASONE DIPROPIONATE AUG 0.05 % EX CREA: TOPICAL_CREAM | Freq: Two times a day (BID) | CUTANEOUS | Status: DC

## 2014-07-10 MED ORDER — PREDNISONE 10 MG PO TABS: ORAL_TABLET | ORAL | Status: DC

## 2014-07-10 MED ORDER — CIMETIDINE 400 MG PO TABS: 400.0000 mg | ORAL_TABLET | Freq: Two times a day (BID) | ORAL | Status: DC

## 2014-07-10 NOTE — Patient Instructions (Addendum)
STOP BACTRIM Hold blood pressure meds today Start Tagamet

## 2014-07-10 NOTE — Assessment & Plan Note (Signed)
4/16 rash on R foot w/swelling x2 wks. Terri Piedra started pt on Bactrim on 4/11 for cellulitis: R foot is better, overall rash and arthralgias are worse; he is still on Bactrim - will d/c.   Predn 10 mg works the best for rash and gout  Prednisone 10 mg: take 4 tabs a day x 3 days; then 3 tabs a day x 4 days; then 2 tabs a day x 4 days, then 1 tab a day. Take pc.   Potential benefits of a long term steroid  use as well as potential risks  and complications were explained to the patient and were aknowledged.

## 2014-07-10 NOTE — Assessment & Plan Note (Signed)
Will sch a f/u w/Dr [name]

## 2014-07-10 NOTE — Assessment & Plan Note (Signed)
Labs Depomedrol Rest

## 2014-07-10 NOTE — Progress Notes (Signed)
Pre visit review using our clinic review tool, if applicable. No additional management support is needed unless otherwise documented below in the visit note. 

## 2014-07-10 NOTE — Assessment & Plan Note (Signed)
Resolving D/c Bactrim

## 2014-07-10 NOTE — Progress Notes (Signed)
Subjective:     C/o rash on R foot w/swelling x2 wks. Terri Piedra started pt on Bactrim on 4/11 for cellulitis: R foot is better, overall rash and arthralgias are worse; he is still on Bactrim F/u  R hand pain and swelling - chronic, better. Pt has been off Prednisone x months. C/o upset stomach, nausea, fatigue x 1 wk Now is taking Uloric and Colchicine - better.  Predn 10 mg works the best for rash and gout F/u HTN F/u prostate ca (Dr [name]) F/u chronic hand rash (Dr Ubaldo Glassing) F/u SOB when climbing steps issue - resolved   HPI   BP Readings from Last 3 Encounters:  07/10/14 102/52  07/03/14 149/59  07/01/14 164/82   Wt Readings from Last 3 Encounters:  07/10/14 228 lb (103.42 kg)  07/01/14 232 lb 12.8 oz (105.597 kg)  03/27/14 238 lb (107.956 kg)      Review of Systems  Constitutional: Positive for fatigue. Negative for appetite change and unexpected weight change.  HENT: Negative for congestion, nosebleeds and sneezing.   Eyes: Negative for itching and visual disturbance.  Cardiovascular: Negative for leg swelling.  Gastrointestinal: Negative for diarrhea, blood in stool and abdominal distention.  Genitourinary: Negative for frequency and hematuria.  Musculoskeletal: Positive for myalgias, arthralgias and gait problem. Negative for joint swelling.  Skin: Positive for rash.  Neurological: Negative for tremors and speech difficulty.  Psychiatric/Behavioral: Negative for suicidal ideas, sleep disturbance, dysphoric mood and agitation. The patient is not nervous/anxious.    BP Readings from Last 3 Encounters:  07/10/14 102/52  07/03/14 149/59  07/01/14 164/82   Wt Readings from Last 3 Encounters:  07/10/14 228 lb (103.42 kg)  07/01/14 232 lb 12.8 oz (105.597 kg)  03/27/14 238 lb (107.956 kg)        Objective:   Physical Exam  Constitutional: He is oriented to person, place, and time. He appears well-developed. No distress.  NAD  HENT:  Mouth/Throat:  Oropharynx is clear and moist.  Eyes: Conjunctivae are normal. Pupils are equal, round, and reactive to light.  Neck: Normal range of motion. No JVD present. No thyromegaly present.  Cardiovascular: Normal rate, regular rhythm, normal heart sounds and intact distal pulses.  Exam reveals no gallop and no friction rub.   No murmur heard. Pulmonary/Chest: Effort normal and breath sounds normal. No respiratory distress. He has no wheezes. He has no rales. He exhibits no tenderness.  Abdominal: Soft. Bowel sounds are normal. He exhibits no distension and no mass. There is tenderness. There is no rebound and no guarding.  Sensitive in upper 1/2  Musculoskeletal: Normal range of motion. He exhibits no edema or tenderness.  Lymphadenopathy:    He has no cervical adenopathy.  Neurological: He is alert and oriented to person, place, and time. He has normal reflexes. No cranial nerve deficit. He exhibits normal muscle tone. He displays a negative Romberg sign. Coordination and gait normal.  No meningeal signs  Skin: Skin is warm and dry. No rash noted.  Psychiatric: He has a normal mood and affect. His behavior is normal. Judgment and thought content normal.  no rash on neck 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 Wet eczema on R foot/leg Dry eczema on B hands, L foot R foot 1+ edema L foot - trace edema Limp, using a crutch  Lab Results  Component Value Date   WBC 10.2 07/03/2014   HGB 10.7* 07/03/2014   HCT 33.2* 07/03/2014   PLT 284 07/03/2014   GLUCOSE 107*  07/03/2014   CHOL 208* 12/05/2013   TRIG 111.0 12/05/2013   HDL 35.30* 12/05/2013   LDLCALC 151* 12/05/2013   ALT 47 07/03/2014   AST 39* 07/03/2014   NA 132* 07/03/2014   K 4.8 07/03/2014   CL 97 07/03/2014   CREATININE 1.79* 07/03/2014   BUN 24* 07/03/2014   CO2 25 07/03/2014   TSH 2.223 10/26/2010   PSA 9.31* 12/05/2013   INR 1.01 10/25/2010   HGBA1C 5.2 03/27/2014    R LE [place] - no DVT    Assessment & Plan:   I spent >45 minutes with  the patient and more than 50% of time was spent in counseling and coordination of care for the above potential benefits of a long term steroid use as well as potential risksand complications  Handicapped form

## 2014-07-11 DIAGNOSIS — L03115 Cellulitis of right lower limb: Secondary | ICD-10-CM | POA: Diagnosis not present

## 2014-07-11 DIAGNOSIS — E875 Hyperkalemia: Secondary | ICD-10-CM | POA: Diagnosis not present

## 2014-07-11 DIAGNOSIS — L309 Dermatitis, unspecified: Secondary | ICD-10-CM | POA: Diagnosis not present

## 2014-07-12 DIAGNOSIS — E875 Hyperkalemia: Secondary | ICD-10-CM | POA: Diagnosis not present

## 2014-07-15 ENCOUNTER — Ambulatory Visit (INDEPENDENT_AMBULATORY_CARE_PROVIDER_SITE_OTHER): Payer: Medicare Other | Admitting: Internal Medicine

## 2014-07-15 ENCOUNTER — Encounter: Payer: Self-pay | Admitting: Internal Medicine

## 2014-07-15 VITALS — BP 120/66 | HR 76 | Temp 98.2°F | Resp 18 | Wt 224.0 lb

## 2014-07-15 DIAGNOSIS — M79604 Pain in right leg: Secondary | ICD-10-CM | POA: Diagnosis not present

## 2014-07-15 DIAGNOSIS — L309 Dermatitis, unspecified: Secondary | ICD-10-CM

## 2014-07-15 DIAGNOSIS — E875 Hyperkalemia: Secondary | ICD-10-CM | POA: Diagnosis not present

## 2014-07-15 DIAGNOSIS — L03031 Cellulitis of right toe: Secondary | ICD-10-CM | POA: Diagnosis not present

## 2014-07-15 NOTE — Assessment & Plan Note (Signed)
4/16 Dr Justin Mend - due to spironolactone rx was d/c'd

## 2014-07-15 NOTE — Progress Notes (Signed)
Subjective:     F/u rash on R foot w/swelling x2+ wks - much is better.  His K was high - per Dr Justin Mend last week. Pt saw Dr Ubaldo Glassing last week too.  Hx: Terri Piedra started pt on Bactrim on 4/11 for cellulitis: R foot is better, overall rash and arthralgias are worse; he is still on Bactrim F/u  R hand pain and swelling - chronic, better. Pt has been off Prednisone x months. C/o upset stomach, nausea, fatigue x 1 wk Now is taking Uloric and Colchicine - better. Predn 10 mg works the best for rash and gout F/u HTN F/u prostate ca (Dr [name]) F/u chronic hand rash (Dr Ubaldo Glassing) F/u SOB when climbing steps issue - resolved   HPI   BP Readings from Last 3 Encounters:  07/15/14 120/66  07/10/14 102/52  07/03/14 149/59   Wt Readings from Last 3 Encounters:  07/15/14 224 lb (101.606 kg)  07/10/14 228 lb (103.42 kg)  07/01/14 232 lb 12.8 oz (105.597 kg)      Review of Systems  Constitutional: Positive for fatigue. Negative for appetite change and unexpected weight change.  HENT: Negative for congestion, nosebleeds and sneezing.   Eyes: Negative for itching and visual disturbance.  Cardiovascular: Negative for leg swelling.  Gastrointestinal: Negative for diarrhea, blood in stool and abdominal distention.  Genitourinary: Negative for frequency and hematuria.  Musculoskeletal: Positive for myalgias, arthralgias and gait problem. Negative for joint swelling.  Skin: Positive for rash.  Neurological: Negative for tremors and speech difficulty.  Psychiatric/Behavioral: Negative for suicidal ideas, sleep disturbance, dysphoric mood and agitation. The patient is not nervous/anxious.    BP Readings from Last 3 Encounters:  07/15/14 120/66  07/10/14 102/52  07/03/14 149/59   Wt Readings from Last 3 Encounters:  07/15/14 224 lb (101.606 kg)  07/10/14 228 lb (103.42 kg)  07/01/14 232 lb 12.8 oz (105.597 kg)        Objective:   Physical Exam  Constitutional: He is oriented to  person, place, and time. He appears well-developed. No distress.  NAD  HENT:  Mouth/Throat: Oropharynx is clear and moist.  Eyes: Conjunctivae are normal. Pupils are equal, round, and reactive to light.  Neck: Normal range of motion. No JVD present. No thyromegaly present.  Cardiovascular: Normal rate, regular rhythm, normal heart sounds and intact distal pulses.  Exam reveals no gallop and no friction rub.   No murmur heard. Pulmonary/Chest: Effort normal and breath sounds normal. No respiratory distress. He has no wheezes. He has no rales. He exhibits no tenderness.  Abdominal: Soft. Bowel sounds are normal. He exhibits no distension and no mass. There is tenderness. There is no rebound and no guarding.  Sensitive in upper 1/2  Musculoskeletal: Normal range of motion. He exhibits no edema or tenderness.  Lymphadenopathy:    He has no cervical adenopathy.  Neurological: He is alert and oriented to person, place, and time. He has normal reflexes. No cranial nerve deficit. He exhibits normal muscle tone. He displays a negative Romberg sign. Coordination and gait normal.  No meningeal signs  Skin: Skin is warm and dry. No rash noted.  Psychiatric: He has a normal mood and affect. His behavior is normal. Judgment and thought content normal.   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 Wet eczema on R foot/leg - dried up Dry eczema on B hands, L foot R foot: trace to 1+ edema L foot - trace edema Limp, using a crutch  Lab Results  Component Value  Date   WBC 6.9 07/10/2014   HGB 10.7* 07/10/2014   HCT 32.3* 07/10/2014   PLT 366.0 07/10/2014   GLUCOSE 110* 07/10/2014   CHOL 208* 12/05/2013   TRIG 111.0 12/05/2013   HDL 35.30* 12/05/2013   LDLCALC 151* 12/05/2013   ALT 36 07/10/2014   AST 26 07/10/2014   NA 130* 07/10/2014   K 5.9* 07/10/2014   CL 100 07/10/2014   CREATININE 2.42* 07/10/2014   BUN 41* 07/10/2014   CO2 23 07/10/2014   TSH 3.17 07/10/2014   PSA 13.26* 07/10/2014   INR 1.01  10/25/2010   HGBA1C 5.2 03/27/2014    R LE [place] - no DVT    Assessment & Plan:

## 2014-07-15 NOTE — Assessment & Plan Note (Signed)
Resolved

## 2014-07-15 NOTE — Assessment & Plan Note (Signed)
Improving.

## 2014-07-15 NOTE — Progress Notes (Signed)
Pre visit review using our clinic review tool, if applicable. No additional management support is needed unless otherwise documented below in the visit note. 

## 2014-07-15 NOTE — Assessment & Plan Note (Signed)
Better  

## 2014-07-17 ENCOUNTER — Encounter: Payer: Self-pay | Admitting: *Deleted

## 2014-07-17 DIAGNOSIS — L239 Allergic contact dermatitis, unspecified cause: Secondary | ICD-10-CM | POA: Diagnosis not present

## 2014-07-17 DIAGNOSIS — L2089 Other atopic dermatitis: Secondary | ICD-10-CM | POA: Diagnosis not present

## 2014-07-19 ENCOUNTER — Ambulatory Visit (INDEPENDENT_AMBULATORY_CARE_PROVIDER_SITE_OTHER): Payer: Medicare Other

## 2014-07-19 DIAGNOSIS — M79673 Pain in unspecified foot: Secondary | ICD-10-CM

## 2014-07-19 DIAGNOSIS — B351 Tinea unguium: Secondary | ICD-10-CM

## 2014-07-23 NOTE — Progress Notes (Signed)
HPI Presents today chief complaint of painful elongated toenails.  Objective: Pulses are palpable bilateral nails are thick, yellow dystrophic onychomycosis and painful palpation.   Assessment: Onychomycosis with pain in limb.  Plan: Treatment of nails in thickness and length as covered service secondary to pain.  

## 2014-07-24 ENCOUNTER — Telehealth: Payer: Self-pay | Admitting: Internal Medicine

## 2014-07-24 MED ORDER — MECLIZINE HCL 12.5 MG PO TABS: 12.5000 mg | ORAL_TABLET | Freq: Three times a day (TID) | ORAL | Status: DC | PRN

## 2014-07-24 NOTE — Telephone Encounter (Signed)
Rx Meclizine - done Thx

## 2014-07-24 NOTE — Telephone Encounter (Signed)
Pt called in and wanted to know if Dr Camila Li could call in meds that he took for dizziness.  He did not know the name of the medication.  ????  He did not give me much info.   He said the last time he got it was last year, taking 2 a day    Best number [phone]

## 2014-07-24 NOTE — Telephone Encounter (Signed)
Notified pt md sent medication to pharmacy...Kurt Sexton

## 2014-07-31 ENCOUNTER — Ambulatory Visit: Payer: Medicare Other | Admitting: Internal Medicine

## 2014-08-12 DIAGNOSIS — E875 Hyperkalemia: Secondary | ICD-10-CM | POA: Diagnosis not present

## 2014-08-14 DIAGNOSIS — L309 Dermatitis, unspecified: Secondary | ICD-10-CM | POA: Diagnosis not present

## 2014-08-14 DIAGNOSIS — L308 Other specified dermatitis: Secondary | ICD-10-CM | POA: Diagnosis not present

## 2014-08-14 DIAGNOSIS — R238 Other skin changes: Secondary | ICD-10-CM | POA: Diagnosis not present

## 2014-08-14 DIAGNOSIS — L209 Atopic dermatitis, unspecified: Secondary | ICD-10-CM | POA: Diagnosis not present

## 2014-08-21 DIAGNOSIS — L309 Dermatitis, unspecified: Secondary | ICD-10-CM | POA: Diagnosis not present

## 2014-08-22 ENCOUNTER — Telehealth: Payer: Self-pay | Admitting: Internal Medicine

## 2014-08-22 NOTE — Telephone Encounter (Signed)
Labs printed/faxed to [place] at number below.

## 2014-08-22 NOTE — Telephone Encounter (Signed)
Janett Billow from [facility] called stating she spoke with someone in the office regarding faxing the patients last lab work to her. She didn't get them. She doesn't know who she talked to but she did say she didn't talk to medical records. Could you please fax them to her at [phone]. She needs them asap and is hoping to get them by lunch time. Her phone number is [phone] option 3.

## 2014-08-23 DIAGNOSIS — C61 Malignant neoplasm of prostate: Secondary | ICD-10-CM | POA: Diagnosis not present

## 2014-09-02 DIAGNOSIS — Z79899 Other long term (current) drug therapy: Secondary | ICD-10-CM | POA: Diagnosis not present

## 2014-09-02 DIAGNOSIS — L309 Dermatitis, unspecified: Secondary | ICD-10-CM | POA: Diagnosis not present

## 2014-09-05 ENCOUNTER — Ambulatory Visit (INDEPENDENT_AMBULATORY_CARE_PROVIDER_SITE_OTHER): Payer: Medicare Other | Admitting: Internal Medicine

## 2014-09-05 ENCOUNTER — Encounter: Payer: Self-pay | Admitting: Internal Medicine

## 2014-09-05 VITALS — BP 162/70 | HR 67 | Wt 237.0 lb

## 2014-09-05 DIAGNOSIS — M1000 Idiopathic gout, unspecified site: Secondary | ICD-10-CM | POA: Diagnosis not present

## 2014-09-05 DIAGNOSIS — M25361 Other instability, right knee: Secondary | ICD-10-CM | POA: Insufficient documentation

## 2014-09-05 DIAGNOSIS — R609 Edema, unspecified: Secondary | ICD-10-CM | POA: Diagnosis not present

## 2014-09-05 MED ORDER — FUROSEMIDE 40 MG PO TABS
40.0000 mg | ORAL_TABLET | Freq: Every day | ORAL | Status: DC | PRN
Start: 2014-09-05 — End: 2015-01-14

## 2014-09-05 NOTE — Progress Notes (Signed)
Pre visit review using our clinic review tool, if applicable. No additional management support is needed unless otherwise documented below in the visit note. 

## 2014-09-05 NOTE — Assessment & Plan Note (Signed)
Cane Dr Tamala Julian  Treat LE edema

## 2014-09-05 NOTE — Assessment & Plan Note (Signed)
Start Furosemide 40-80 mg/d prn Labs

## 2014-09-05 NOTE — Progress Notes (Signed)
Subjective:     C/o a fall  L knee is weak - gives out C/o feet swelling - worse  Hx: Kurt Sexton started pt on Bactrim on 4/11 for cellulitis: R foot is better, overall rash and arthralgias are worse; he is still on Bactrim F/u  R hand pain and swelling - chronic, better. Pt has been off Prednisone x months. C/o upset stomach, nausea, fatigue x 1 wk Now is taking Uloric and Colchicine - better. Predn 10 mg works the best for rash and gout F/u HTN F/u prostate ca (Dr [name]) F/u chronic hand rash (Dr Ubaldo Glassing) F/u SOB when climbing steps issue - resolved   HPI   BP Readings from Last 3 Encounters:  09/05/14 162/70  07/15/14 120/66  07/10/14 102/52   Wt Readings from Last 3 Encounters:  09/05/14 237 lb (107.502 kg)  07/15/14 224 lb (101.606 kg)  07/10/14 228 lb (103.42 kg)      Review of Systems  Constitutional: Positive for fatigue. Negative for appetite change and unexpected weight change.  HENT: Negative for congestion, nosebleeds and sneezing.   Eyes: Negative for itching and visual disturbance.  Cardiovascular: Negative for leg swelling.  Gastrointestinal: Negative for diarrhea, blood in stool and abdominal distention.  Genitourinary: Negative for frequency and hematuria.  Musculoskeletal: Positive for myalgias, arthralgias and gait problem. Negative for joint swelling.  Skin: Positive for rash.  Neurological: Negative for tremors and speech difficulty.  Psychiatric/Behavioral: Negative for suicidal ideas, sleep disturbance, dysphoric mood and agitation. The patient is not nervous/anxious.           Objective:   Physical Exam  Constitutional: He is oriented to person, place, and time. He appears well-developed. No distress.  NAD  HENT:  Mouth/Throat: Oropharynx is clear and moist.  Eyes: Conjunctivae are normal. Pupils are equal, round, and reactive to light.  Neck: Normal range of motion. No JVD present. No thyromegaly present.  Cardiovascular: Normal  rate, regular rhythm, normal heart sounds and intact distal pulses.  Exam reveals no gallop and no friction rub.   No murmur heard. Pulmonary/Chest: Effort normal and breath sounds normal. No respiratory distress. He has no wheezes. He has no rales. He exhibits no tenderness.  Abdominal: Soft. Bowel sounds are normal. He exhibits no distension and no mass. There is tenderness. There is no rebound and no guarding.  Sensitive in upper 1/2  Musculoskeletal: Normal range of motion. He exhibits no edema or tenderness.  Lymphadenopathy:    He has no cervical adenopathy.  Neurological: He is alert and oriented to person, place, and time. He has normal reflexes. No cranial nerve deficit. He exhibits normal muscle tone. He displays a negative Romberg sign. Coordination and gait normal.  No meningeal signs  Skin: Skin is warm and dry. No rash noted.  Psychiatric: He has a normal mood and affect. His behavior is normal. Judgment and thought content normal.   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 Wet eczema on R foot/leg - dried up Dry eczema on B hands, L foot R foot: trace to 1 or 2+ edema L foot - 1+ edema Limp, using a cane  Lab Results  Component Value Date   WBC 6.9 07/10/2014   HGB 10.7* 07/10/2014   HCT 32.3* 07/10/2014   PLT 366.0 07/10/2014   GLUCOSE 110* 07/10/2014   CHOL 208* 12/05/2013   TRIG 111.0 12/05/2013   HDL 35.30* 12/05/2013   LDLCALC 151* 12/05/2013   ALT 36 07/10/2014   AST 26 07/10/2014  NA 130* 07/10/2014   K 5.9* 07/10/2014   CL 100 07/10/2014   CREATININE 2.42* 07/10/2014   BUN 41* 07/10/2014   CO2 23 07/10/2014   TSH 3.17 07/10/2014   PSA 13.26* 07/10/2014   INR 1.01 10/25/2010   HGBA1C 5.2 03/27/2014    R LE [place] - no DVT    Assessment & Plan:

## 2014-09-06 DIAGNOSIS — C61 Malignant neoplasm of prostate: Secondary | ICD-10-CM | POA: Diagnosis not present

## 2014-09-06 NOTE — Assessment & Plan Note (Signed)
Doing fair on Prednisone

## 2014-09-12 DIAGNOSIS — L120 Bullous pemphigoid: Secondary | ICD-10-CM | POA: Diagnosis not present

## 2014-09-12 DIAGNOSIS — Z79899 Other long term (current) drug therapy: Secondary | ICD-10-CM | POA: Diagnosis not present

## 2014-09-16 ENCOUNTER — Other Ambulatory Visit: Payer: Self-pay

## 2014-09-17 DIAGNOSIS — L309 Dermatitis, unspecified: Secondary | ICD-10-CM | POA: Diagnosis not present

## 2014-09-17 DIAGNOSIS — Z79899 Other long term (current) drug therapy: Secondary | ICD-10-CM | POA: Diagnosis not present

## 2014-09-18 ENCOUNTER — Ambulatory Visit (INDEPENDENT_AMBULATORY_CARE_PROVIDER_SITE_OTHER)
Admission: RE | Admit: 2014-09-18 | Discharge: 2014-09-18 | Disposition: A | Discharge status: Home / Self Care | Payer: Medicare Other | Source: Ambulatory Visit | Attending: Family Medicine | Admitting: Family Medicine

## 2014-09-18 ENCOUNTER — Ambulatory Visit (INDEPENDENT_AMBULATORY_CARE_PROVIDER_SITE_OTHER): Payer: Medicare Other | Admitting: Family Medicine

## 2014-09-18 ENCOUNTER — Encounter: Payer: Self-pay | Admitting: Family Medicine

## 2014-09-18 VITALS — BP 132/66 | HR 85 | Ht 76.5 in | Wt 232.0 lb

## 2014-09-18 DIAGNOSIS — M129 Arthropathy, unspecified: Secondary | ICD-10-CM

## 2014-09-18 DIAGNOSIS — M170 Bilateral primary osteoarthritis of knee: Secondary | ICD-10-CM | POA: Diagnosis not present

## 2014-09-18 DIAGNOSIS — IMO0002 Reserved for concepts with insufficient information to code with codable children: Secondary | ICD-10-CM

## 2014-09-18 NOTE — Patient Instructions (Addendum)
Good to meet you Try to ice at least twice a day, after activity and before bedtime Do the exercises 3x per week Wear the brace with activity  Try the topical medicine - Pennsaid Tumeric 500mg  2x/day Tylenol 325mg  3x/day (with meals) Vitamin D 2000mg  each day See me again in 3-4 weeks.

## 2014-09-18 NOTE — Assessment & Plan Note (Signed)
Patient was given injections in bilateral knees.she was also given a medial unloader brace for the right knee. We discussed home exercises, icing protocol, and what activities potentially avoid. Patient is going to try topical anti-inflammatory than we discussed over-the-counter natural supple mentation's. Patient will get x-rays today. Patient will come back and see me again in 3-4 weeks. Patient could be a candidate for viscous supplementation and does not make any improvement.

## 2014-09-18 NOTE — Progress Notes (Signed)
[facility] [address] [place], [place] [address] Phone: [phone] Subjective:    I'm seeing this patient by the request  of:  [name], MD   CC: bilateral knee pain  Kurt Sexton is a 74 y.o. male coming in with complaint of bilateral knee pain. Patient states that he is had this pain for multiple months but seems to be getting worse. Patient actually had an incident where he did fall. Patient felt that the right leg collapsed on him. Patient has had this actually happened since the first previous time. Patient states that he did fall on his left knee which gives him pain as well. Patient states that he does have unstable feeling in the right knee. Denies any radiation down the legs but states that there is a throbbing sensation in the knees bilaterally. Patient states even at night he can have some mild discomfort. Patient avoids anti-inflammatory secondary to his other comorbidities. Patient still able to do daily activities but finds it difficult such as going up or down stairs. Patient has not tried any other home modalities. States that the pain in his right knee is 8 out of 10 in severity and 7 out of 10 on the contralateral side.  Past Medical History  Diagnosis Date  . HTN (hypertension)   . Osteoarthritis   . BPH (benign prostatic hyperplasia)     Dr [name]  . CVA (cerebral infarction) 1998  . Vitamin D deficiency   . LBP (low back pain) 2010  . Gout   . Stroke 1998  . High cholesterol   . Prostate cancer 01/2008    gleason 3+4=7, PSA 4.93, vol 34.18 cc   Past Surgical History  Procedure Laterality Date  . Tonsillectomy    . Hernia repair      x2  . Knee arthroscopy w/ pcl and lcl  repair & tendon graft    . Correction hammer toe    . Prostate biopsy  01/2008    gleason 7  . Prostate biopsy  03/27/13    gleason 3+4=7, volume 34.18 cc   History  Substance Use Topics  . Smoking status: Never Smoker   . Smokeless  tobacco: Never Used  . Alcohol Use: No   Allergies  Allergen Reactions  . Amlodipine Besylate Other (See Comments)    dizzy  . Cozaar     Severe headache  . Diazepam   . Doxazosin Mesylate   . Lisinopril     REACTION: HA  . Ramipril     REACTION: HA  . Spironolactone     High potassium  . Bactrim [Sulfamethoxazole-Trimethoprim] Rash   Family History  Problem Relation Age of Onset  . Diabetes Other     1st degree relative  . Hypertension Other   . Cancer Brother     prostate, tx w/xrt        Past medical history, social, surgical and family history all reviewed in electronic medical record.   Review of Systems: No headache, visual changes, nausea, vomiting, diarrhea, constipation, dizziness, abdominal pain, skin rash, fevers, chills, night sweats, weight loss, swollen lymph nodes, body aches, joint swelling, muscle aches, chest pain, shortness of breath, mood changes.   Objective Blood pressure 132/66, pulse 85, height 6' 4.5" (1.943 m), weight 232 lb (105.235 kg), SpO2 95 %.  General: No apparent distress alert and oriented x3 mood and affect normal, dressed appropriately.  HEENT: Pupils equal, extraocular movements intact  Respiratory:  Patient's speak in full sentences and does not appear short of breath  Cardiovascular:2+ lower extremity edema, non tender, no erythema  Skin: Warm dry intact with no signs of infection or rash on extremities or on axial skeleton.  Abdomen: Soft nontender  Neuro: Cranial nerves II through XII are intact, neurovascularly intact in all extremities with 2+ DTRs and 2+ pulses.  Lymph: No lymphadenopathy of posterior or anterior cervical chain or axillae bilaterally.  Gait mild antalgic gait walking with a cane  MSK:  Non tender with full range of motion and good stability and symmetric strength and tone of shoulders, elbows, wrist, hip, and ankles bilaterally.  Knee:bilateral Valgus deformity noted Mild tenderness to palpation over the  medial joint line bilaterally. ROM full in flexion and extension and lower leg rotation. Ligaments with solid consistent endpoints including ACL, PCL, LCL, MCL. Negative Mcmurray's, Apley's, and Thessalonian tests. Mild painful patellar compression Patellar glide with moderate to severe crepitus Patellar and quadriceps tendons unremarkable. Hamstring and quadriceps strength is normal.    After informed written and verbal consent, patient was seated on exam table. Right knee was prepped with alcohol swab and utilizing anterolateral approach, patient's right knee space was injected with 4:1  marcaine 0.5%: Kenalog 40mg /dL. Patient tolerated the procedure well without immediate complications.  After informed written and verbal consent, patient was seated on exam table. Left knee was prepped with alcohol swab and utilizing anterolateral approach, patient's left knee space was injected with 4:1  marcaine 0.5%: Kenalog 40mg /dL. Patient tolerated the procedure well without immediate complications.  Procedure note [id number]; 15 minutes spent for Therapeutic exercises as stated in above notes.  This included exercises focusing on stretching, strengthening, with significant focus on eccentric aspects. Patient infection and extension and side as well as vastus medialis oblique and hip abductor exercise.  Proper technique shown and discussed handout in great detail with ATC.  All questions were discussed and answered.     Impression and Recommendations:     This case required medical decision making of moderate complexity.

## 2014-09-18 NOTE — Progress Notes (Signed)
Pre visit review using our clinic review tool, if applicable. No additional management support is needed unless otherwise documented below in the visit note. 

## 2014-10-09 ENCOUNTER — Ambulatory Visit (INDEPENDENT_AMBULATORY_CARE_PROVIDER_SITE_OTHER): Payer: Medicare Other | Admitting: Family Medicine

## 2014-10-09 ENCOUNTER — Encounter: Payer: Self-pay | Admitting: Family Medicine

## 2014-10-09 VITALS — BP 154/82 | HR 75 | Wt 229.0 lb

## 2014-10-09 DIAGNOSIS — M129 Arthropathy, unspecified: Secondary | ICD-10-CM | POA: Diagnosis not present

## 2014-10-09 DIAGNOSIS — IMO0002 Reserved for concepts with insufficient information to code with codable children: Secondary | ICD-10-CM

## 2014-10-09 NOTE — Assessment & Plan Note (Signed)
Improved with injection and bracing, icing and HEP.  Conitnue current therapy and  Follow-up in 2 months. Can repeat sterile injections at that time. If any worsening symptoms before that we will start with  Viscous supplementation.

## 2014-10-09 NOTE — Patient Instructions (Addendum)
Good to see you Ice is your friend Continue the exercises Wear brace with activity See me again in 2 months and we can repeat injection if needed.

## 2014-10-09 NOTE — Progress Notes (Signed)
[facility] [address] [place], [place] [address] Phone: [phone] Subjective:    I'm seeing this patient by the request  of:  [name], MD   CC: bilateral knee pain follow-up  HEN:IDPOEUMPNT Kurt Sexton is a 74 y.o. male coming in with complaint of bilateral knee pain. Patient was seen previously for knee pain. Patient was found to have moderate osteoarthritic changes of the knees bilaterally. Patient did have sterile injections one month ago and was to do home exercises, icing protocol, as well as topical anti-inflammatory's and we discussed natural supplementations. Patient states he is approximately 50-60% better. Still some mild discomfort but no instability. Patient states that he continues to have some mild pain when going up or down stairs. Patient states that she he is in really with the aid of a cane carefully but nothing that is stopping him from activities at this time.  Past Medical History  Diagnosis Date  . HTN (hypertension)   . Osteoarthritis   . BPH (benign prostatic hyperplasia)     Dr [name]  . CVA (cerebral infarction) 1998  . Vitamin D deficiency   . LBP (low back pain) 2010  . Gout   . Stroke 1998  . High cholesterol   . Prostate cancer 01/2008    gleason 3+4=7, PSA 4.93, vol 34.18 cc   Past Surgical History  Procedure Laterality Date  . Tonsillectomy    . Hernia repair      x2  . Knee arthroscopy w/ pcl and lcl  repair & tendon graft    . Correction hammer toe    . Prostate biopsy  01/2008    gleason 7  . Prostate biopsy  03/27/13    gleason 3+4=7, volume 34.18 cc   History  Substance Use Topics  . Smoking status: Never Smoker   . Smokeless tobacco: Never Used  . Alcohol Use: No   Allergies  Allergen Reactions  . Amlodipine Besylate Other (See Comments)    dizzy  . Cozaar     Severe headache  . Diazepam   . Doxazosin Mesylate   . Lisinopril     REACTION: HA  . Ramipril     REACTION: HA  .  Spironolactone     High potassium  . Bactrim [Sulfamethoxazole-Trimethoprim] Rash   Family History  Problem Relation Age of Onset  . Diabetes Other     1st degree relative  . Hypertension Other   . Cancer Brother     prostate, tx w/xrt        Past medical history, social, surgical and family history all reviewed in electronic medical record.   Review of Systems: No headache, visual changes, nausea, vomiting, diarrhea, constipation, dizziness, abdominal pain, skin rash, fevers, chills, night sweats, weight loss, swollen lymph nodes, body aches, joint swelling, muscle aches, chest pain, shortness of breath, mood changes.   Objective Blood pressure 154/82, pulse 75, weight 229 lb (103.874 kg), SpO2 94 %.  General: No apparent distress alert and oriented x3 mood and affect normal, dressed appropriately.  HEENT: Pupils equal, extraocular movements intact  Respiratory: Patient's speak in full sentences and does not appear short of breath  Cardiovascular:2+ lower extremity edema, non tender, no erythema  Skin: Warm dry intact with no signs of infection or rash on extremities or on axial skeleton.  Abdomen: Soft nontender  Neuro: Cranial nerves II through XII are intact, neurovascularly intact in all extremities with 2+ DTRs and 2+ pulses.  Lymph: No lymphadenopathy of posterior or anterior cervical chain or axillae bilaterally.  Gait mild antalgic gait walking with a cane  Still present MSK:  Non tender with full range of motion and good stability and symmetric strength and tone of shoulders, elbows, wrist, hip, and ankles bilaterally.  Knee:bilateral Valgus deformity noted  decreased tenderness. ROM full in flexion and extension and lower leg rotation. Ligaments with solid consistent endpoints including ACL, PCL, LCL, MCL. Negative Mcmurray's, Apley's, and Thessalonian tests. Mild painful patellar compression Patellar glide with moderate to severe crepitus Patellar and quadriceps  tendons unremarkable. Hamstring and quadriceps strength is normal.      Impression and Recommendations:     This case required medical decision making of moderate complexity.

## 2014-10-17 ENCOUNTER — Ambulatory Visit (INDEPENDENT_AMBULATORY_CARE_PROVIDER_SITE_OTHER): Payer: Medicare Other | Admitting: Internal Medicine

## 2014-10-17 ENCOUNTER — Encounter: Payer: Self-pay | Admitting: Internal Medicine

## 2014-10-17 VITALS — BP 139/86 | HR 77 | Wt 234.0 lb

## 2014-10-17 DIAGNOSIS — L309 Dermatitis, unspecified: Secondary | ICD-10-CM

## 2014-10-17 DIAGNOSIS — E559 Vitamin D deficiency, unspecified: Secondary | ICD-10-CM | POA: Diagnosis not present

## 2014-10-17 DIAGNOSIS — G5691 Unspecified mononeuropathy of right upper limb: Secondary | ICD-10-CM

## 2014-10-17 DIAGNOSIS — I674 Hypertensive encephalopathy: Secondary | ICD-10-CM

## 2014-10-17 DIAGNOSIS — R21 Rash and other nonspecific skin eruption: Secondary | ICD-10-CM

## 2014-10-17 DIAGNOSIS — Z79899 Other long term (current) drug therapy: Secondary | ICD-10-CM | POA: Diagnosis not present

## 2014-10-17 DIAGNOSIS — G5690 Unspecified mononeuropathy of unspecified upper limb: Secondary | ICD-10-CM | POA: Insufficient documentation

## 2014-10-17 NOTE — Assessment & Plan Note (Signed)
On Vit D 

## 2014-10-17 NOTE — Progress Notes (Signed)
Pre visit review using our clinic review tool, if applicable. No additional management support is needed unless otherwise documented below in the visit note. 

## 2014-10-17 NOTE — Assessment & Plan Note (Signed)
On MTX now weekly

## 2014-10-17 NOTE — Progress Notes (Signed)
Subjective:  Patient ID: Kurt Sexton, male    DOB: 07-06-1940  Age: 74 y.o. MRN: [id number]  CC: No chief complaint on file.   HPI Kurt Sexton presents for HTN, OA, LBP, GERD. C/o R hand finger numbness x 1 wk - off and on. Dr Tamala Julian has injected the knees - better  Outpatient Prescriptions Prior to Visit  Medication Sig Dispense Refill  . aspirin 81 MG EC tablet Take 81 mg by mouth daily.      Marland Kitchen atorvastatin (LIPITOR) 10 MG tablet Take 1 tablet (10 mg total) by mouth daily. 30 tablet 11  . cimetidine (TAGAMET) 400 MG tablet Take 1 tablet (400 mg total) by mouth 2 (two) times daily. 60 tablet 5  . furosemide (LASIX) 40 MG tablet Take 1-2 tablets (40-80 mg total) by mouth daily as needed for fluid or edema. 60 tablet 6  . HYDROcodone-acetaminophen (NORCO) 5-325 MG per tablet Take 1 tablet by mouth every 4 (four) hours as needed. 15 tablet 0  . hydrOXYzine (ATARAX/VISTARIL) 25 MG tablet Take 1 tablet (25 mg total) by mouth every 8 (eight) hours as needed for itching or nausea. 60 tablet 1  . labetalol (NORMODYNE) 200 MG tablet Take 1 tablet (200 mg total) by mouth 3 (three) times daily. 180 tablet 3  . loratadine (CLARITIN) 10 MG tablet Take 10 mg by mouth daily.      . meclizine (ANTIVERT) 12.5 MG tablet Take 1 tablet (12.5 mg total) by mouth 3 (three) times daily as needed for dizziness. 60 tablet 1  . methotrexate (RHEUMATREX) 2.5 MG tablet Take 2 tablets by mouth daily.    . predniSONE (DELTASONE) 10 MG tablet Prednisone 10 mg: take 4 tabs a day x 3 days; then 3 tabs a day x 4 days; then 2 tabs a day x 4 days, then 1 tab a day. Take pc. 100 tablet 1  . augmented betamethasone dipropionate (DIPROLENE AF) 0.05 % cream Apply topically 2 (two) times daily. (Patient not taking: Reported on 10/17/2014) 100 g 3  . clobetasol cream (TEMOVATE) 0.05 %   0   No facility-administered medications prior to visit.    ROS Review of Systems  Constitutional: Negative for fever, appetite change,  fatigue and unexpected weight change.  HENT: Negative for congestion, nosebleeds, sneezing, sore throat and trouble swallowing.   Eyes: Negative for itching and visual disturbance.  Respiratory: Negative for cough.   Cardiovascular: Negative for chest pain, palpitations and leg swelling.  Gastrointestinal: Negative for nausea, diarrhea, blood in stool and abdominal distention.  Genitourinary: Negative for frequency and hematuria.  Musculoskeletal: Negative for back pain, joint swelling, gait problem and neck pain.  Skin: Positive for rash.  Neurological: Positive for numbness. Negative for dizziness, tremors, speech difficulty, weakness and headaches.  Psychiatric/Behavioral: Negative for sleep disturbance, dysphoric mood and agitation. The patient is nervous/anxious.     Objective:  BP 150/80 mmHg  Pulse 77  Wt 234 lb (106.142 kg)  SpO2 97%  BP Readings from Last 3 Encounters:  10/17/14 150/80  10/09/14 154/82  09/18/14 132/66    Wt Readings from Last 3 Encounters:  10/17/14 234 lb (106.142 kg)  10/09/14 229 lb (103.874 kg)  09/18/14 232 lb (105.235 kg)    Physical Exam  Constitutional: He is oriented to person, place, and time. He appears well-developed. No distress.  NAD  HENT:  Mouth/Throat: Oropharynx is clear and moist.  Eyes: Conjunctivae are normal. Pupils are equal, round, and reactive to light.  Neck: Normal range of motion. No JVD present. No thyromegaly present.  Cardiovascular: Normal rate, regular rhythm, normal heart sounds and intact distal pulses.  Exam reveals no gallop and no friction rub.   No murmur heard. Pulmonary/Chest: Effort normal and breath sounds normal. No respiratory distress. He has no wheezes. He has no rales. He exhibits no tenderness.  Abdominal: Soft. Bowel sounds are normal. He exhibits no distension and no mass. There is no tenderness. There is no rebound and no guarding.  Musculoskeletal: Normal range of motion. He exhibits tenderness.  He exhibits no edema.  Lymphadenopathy:    He has no cervical adenopathy.  Neurological: He is alert and oriented to person, place, and time. He has normal reflexes. No cranial nerve deficit. He exhibits normal muscle tone. He displays a negative Romberg sign. Coordination abnormal. Gait normal.  Skin: Skin is warm and dry. Rash noted.  Psychiatric: He has a normal mood and affect. His behavior is normal. Judgment and thought content normal.  Using a cane CTS tests (-) B R thenar area is w/waistng; grip is weaker  Lab Results  Component Value Date   WBC 6.9 07/10/2014   HGB 10.7* 07/10/2014   HCT 32.3* 07/10/2014   PLT 366.0 07/10/2014   GLUCOSE 110* 07/10/2014   CHOL 208* 12/05/2013   TRIG 111.0 12/05/2013   HDL 35.30* 12/05/2013   LDLCALC 151* 12/05/2013   ALT 36 07/10/2014   AST 26 07/10/2014   NA 130* 07/10/2014   K 5.9* 07/10/2014   CL 100 07/10/2014   CREATININE 2.42* 07/10/2014   BUN 41* 07/10/2014   CO2 23 07/10/2014   TSH 3.17 07/10/2014   PSA 13.26* 07/10/2014   INR 1.01 10/25/2010   HGBA1C 5.2 03/27/2014    Dg Knee Bilateral Standing Ap  09/18/2014   CLINICAL DATA:  RIGHT knee pain, chronic, no known injury, slight swelling, arthritis of both lower extremities  EXAM: BILATERAL KNEES STANDING - 1 VIEW  COMPARISON:  None  FINDINGS: BILATERAL narrowing of the medial and lateral compartments of both knees slightly greater on LEFT.  Bones appear slightly demineralized.  No fracture, dislocation or bone destruction identified on single AP views.  Soft tissues unremarkable.  IMPRESSION: Osteoarthritic changes of both knees.   Electronically Signed   By: Lavonia Dana M.D.   On: 09/18/2014 15:01    Assessment & Plan:   There are no diagnoses linked to this encounter. I am having Kurt Sexton maintain his aspirin, loratadine, labetalol, atorvastatin, HYDROcodone-acetaminophen, predniSONE, augmented betamethasone dipropionate, cimetidine, hydrOXYzine, clobetasol cream,  meclizine, methotrexate, furosemide, and triamcinolone ointment.  Meds ordered this encounter  Medications  . triamcinolone ointment (KENALOG) 0.1 %    Sig: APPLY TO AFFECTED AREA TWICE A DAY    Refill:  0     Follow-up: No Follow-up on file.  [name], MD

## 2014-10-17 NOTE — Assessment & Plan Note (Signed)
States BP is ok at home - the best we can do... Labetalol, furosemide

## 2014-10-17 NOTE — Assessment & Plan Note (Signed)
F/u w/derm On MTX now weekly

## 2014-10-17 NOTE — Assessment & Plan Note (Signed)
R hand thenar w/waisting Weak grip  ?etiology Neurol ref

## 2014-10-18 ENCOUNTER — Ambulatory Visit: Payer: Medicare Other

## 2014-10-23 DIAGNOSIS — L2089 Other atopic dermatitis: Secondary | ICD-10-CM | POA: Diagnosis not present

## 2014-10-23 DIAGNOSIS — Z79899 Other long term (current) drug therapy: Secondary | ICD-10-CM | POA: Diagnosis not present

## 2014-10-24 ENCOUNTER — Emergency Department (HOSPITAL_COMMUNITY): Payer: No Typology Code available for payment source

## 2014-10-24 ENCOUNTER — Encounter (HOSPITAL_COMMUNITY): Payer: Self-pay | Admitting: Emergency Medicine

## 2014-10-24 ENCOUNTER — Ambulatory Visit: Payer: Medicare Other | Admitting: Podiatry

## 2014-10-24 ENCOUNTER — Emergency Department (HOSPITAL_COMMUNITY)
Admission: EM | Admit: 2014-10-24 | Discharge: 2014-10-24 | Disposition: A | Discharge status: Home / Self Care | Payer: No Typology Code available for payment source | Attending: Emergency Medicine | Admitting: Emergency Medicine

## 2014-10-24 DIAGNOSIS — M47812 Spondylosis without myelopathy or radiculopathy, cervical region: Secondary | ICD-10-CM | POA: Diagnosis not present

## 2014-10-24 DIAGNOSIS — M542 Cervicalgia: Secondary | ICD-10-CM

## 2014-10-24 DIAGNOSIS — S2231XA Fracture of one rib, right side, initial encounter for closed fracture: Secondary | ICD-10-CM | POA: Diagnosis not present

## 2014-10-24 DIAGNOSIS — S0990XA Unspecified injury of head, initial encounter: Secondary | ICD-10-CM | POA: Diagnosis not present

## 2014-10-24 DIAGNOSIS — I10 Essential (primary) hypertension: Secondary | ICD-10-CM | POA: Diagnosis not present

## 2014-10-24 DIAGNOSIS — Y92410 Unspecified street and highway as the place of occurrence of the external cause: Secondary | ICD-10-CM | POA: Diagnosis not present

## 2014-10-24 DIAGNOSIS — Z7982 Long term (current) use of aspirin: Secondary | ICD-10-CM | POA: Diagnosis not present

## 2014-10-24 DIAGNOSIS — Z79899 Other long term (current) drug therapy: Secondary | ICD-10-CM | POA: Insufficient documentation

## 2014-10-24 DIAGNOSIS — M109 Gout, unspecified: Secondary | ICD-10-CM | POA: Insufficient documentation

## 2014-10-24 DIAGNOSIS — R079 Chest pain, unspecified: Secondary | ICD-10-CM | POA: Diagnosis not present

## 2014-10-24 DIAGNOSIS — S3993XA Unspecified injury of pelvis, initial encounter: Secondary | ICD-10-CM | POA: Diagnosis not present

## 2014-10-24 DIAGNOSIS — R51 Headache: Secondary | ICD-10-CM | POA: Diagnosis not present

## 2014-10-24 DIAGNOSIS — S069X9A Unspecified intracranial injury with loss of consciousness of unspecified duration, initial encounter: Secondary | ICD-10-CM | POA: Diagnosis not present

## 2014-10-24 DIAGNOSIS — E782 Mixed hyperlipidemia: Secondary | ICD-10-CM | POA: Diagnosis not present

## 2014-10-24 DIAGNOSIS — Z8546 Personal history of malignant neoplasm of prostate: Secondary | ICD-10-CM | POA: Insufficient documentation

## 2014-10-24 DIAGNOSIS — Y998 Other external cause status: Secondary | ICD-10-CM | POA: Diagnosis not present

## 2014-10-24 DIAGNOSIS — Y9389 Activity, other specified: Secondary | ICD-10-CM | POA: Insufficient documentation

## 2014-10-24 DIAGNOSIS — R1011 Right upper quadrant pain: Secondary | ICD-10-CM

## 2014-10-24 DIAGNOSIS — S199XXA Unspecified injury of neck, initial encounter: Secondary | ICD-10-CM | POA: Diagnosis not present

## 2014-10-24 DIAGNOSIS — Z8673 Personal history of transient ischemic attack (TIA), and cerebral infarction without residual deficits: Secondary | ICD-10-CM | POA: Diagnosis not present

## 2014-10-24 DIAGNOSIS — S3991XA Unspecified injury of abdomen, initial encounter: Secondary | ICD-10-CM | POA: Insufficient documentation

## 2014-10-24 LAB — CBC
HCT: 30.1 % — ABNORMAL LOW (ref 39.0–52.0)
HEMOGLOBIN: 9.6 g/dL — AB (ref 13.0–17.0)
MCH: 26.6 pg (ref 26.0–34.0)
MCHC: 31.9 g/dL (ref 30.0–36.0)
MCV: 83.4 fL (ref 78.0–100.0)
PLATELETS: 254 10*3/uL (ref 150–400)
RBC: 3.61 MIL/uL — ABNORMAL LOW (ref 4.22–5.81)
RDW: 15 % (ref 11.5–15.5)
WBC: 5.4 10*3/uL (ref 4.0–10.5)

## 2014-10-24 LAB — BASIC METABOLIC PANEL
Anion gap: 6 (ref 5–15)
BUN: 14 mg/dL (ref 6–20)
CHLORIDE: 109 mmol/L (ref 101–111)
CO2: 26 mmol/L (ref 22–32)
Calcium: 8.9 mg/dL (ref 8.9–10.3)
Creatinine, Ser: 1.08 mg/dL (ref 0.61–1.24)
GLUCOSE: 108 mg/dL — AB (ref 65–99)
Potassium: 3.8 mmol/L (ref 3.5–5.1)
SODIUM: 141 mmol/L (ref 135–145)

## 2014-10-24 MED ORDER — OXYCODONE-ACETAMINOPHEN 5-325 MG PO TABS
2.0000 | ORAL_TABLET | Freq: Once | ORAL | Status: AC
  Administered 2014-10-24: 2 via ORAL
  Filled 2014-10-24: qty 2

## 2014-10-24 MED ORDER — IOHEXOL 300 MG/ML  SOLN
100.0000 mL | Freq: Once | INTRAMUSCULAR | Status: AC | PRN
  Administered 2014-10-24: 75 mL via INTRAVENOUS

## 2014-10-24 MED ORDER — IOHEXOL 300 MG/ML  SOLN: 25.0000 mL | Freq: Once | INTRAMUSCULAR | Status: DC | PRN

## 2014-10-24 MED ORDER — IBUPROFEN 600 MG PO TABS: 600.0000 mg | ORAL_TABLET | Freq: Three times a day (TID) | ORAL | Status: DC | PRN

## 2014-10-24 MED ORDER — HYDROMORPHONE HCL 1 MG/ML IJ SOLN
1.0000 mg | Freq: Once | INTRAMUSCULAR | Status: AC
  Administered 2014-10-24: 1 mg via INTRAVENOUS
  Filled 2014-10-24: qty 1

## 2014-10-24 MED ORDER — OXYCODONE-ACETAMINOPHEN 5-325 MG PO TABS: 1.0000 | ORAL_TABLET | ORAL | Status: DC | PRN

## 2014-10-24 NOTE — Discharge Instructions (Signed)
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
## 2014-10-24 NOTE — ED Notes (Signed)
IV was not charted. Discontinued per order of pt getting discharged. Catheter was intact, site clean and dry.
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
## 2014-10-24 NOTE — ED Notes (Signed)
Patient transported to CT 

## 2014-10-24 NOTE — ED Notes (Signed)
Given incentive spirometer and instructed on use.  Patient repeated instructions and demonstrated.

## 2014-10-28 ENCOUNTER — Telehealth: Payer: Self-pay | Admitting: Podiatry
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
## 2014-11-08 ENCOUNTER — Encounter: Payer: Self-pay | Admitting: Neurology

## 2014-11-08 ENCOUNTER — Ambulatory Visit (INDEPENDENT_AMBULATORY_CARE_PROVIDER_SITE_OTHER): Payer: Medicare Other | Admitting: Neurology

## 2014-11-08 VITALS — BP 146/80 | HR 86 | Ht 76.5 in | Wt 231.0 lb

## 2014-11-08 DIAGNOSIS — G5621 Lesion of ulnar nerve, right upper limb: Secondary | ICD-10-CM | POA: Diagnosis not present

## 2014-11-08 DIAGNOSIS — M6250 Muscle wasting and atrophy, not elsewhere classified, unspecified site: Secondary | ICD-10-CM

## 2014-11-08 DIAGNOSIS — M5412 Radiculopathy, cervical region: Secondary | ICD-10-CM | POA: Diagnosis not present

## 2014-11-08 DIAGNOSIS — M6289 Other specified disorders of muscle: Secondary | ICD-10-CM | POA: Diagnosis not present

## 2014-11-08 DIAGNOSIS — R29898 Other symptoms and signs involving the musculoskeletal system: Secondary | ICD-10-CM

## 2014-11-08 NOTE — Patient Instructions (Signed)
1.  EMG of the right > left arm 2.  Start occupational therapy 3.  Return to clinic in 4-6 weeks
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
## 2014-11-12 ENCOUNTER — Ambulatory Visit (INDEPENDENT_AMBULATORY_CARE_PROVIDER_SITE_OTHER): Payer: Medicare Other | Admitting: Neurology

## 2014-11-12 DIAGNOSIS — M6289 Other specified disorders of muscle: Secondary | ICD-10-CM

## 2014-11-12 DIAGNOSIS — G629 Polyneuropathy, unspecified: Secondary | ICD-10-CM

## 2014-11-12 DIAGNOSIS — R29898 Other symptoms and signs involving the musculoskeletal system: Secondary | ICD-10-CM

## 2014-11-12 DIAGNOSIS — G5621 Lesion of ulnar nerve, right upper limb: Secondary | ICD-10-CM

## 2014-11-12 DIAGNOSIS — M5412 Radiculopathy, cervical region: Secondary | ICD-10-CM

## 2014-11-12 DIAGNOSIS — M6250 Muscle wasting and atrophy, not elsewhere classified, unspecified site: Secondary | ICD-10-CM

## 2014-11-12 NOTE — Procedures (Signed)
[facility]  [place], [address]  [place], [place] [address] Tel: [phone] Fax:  [phone] Test Date:  11/12/2014  Patient: Kurt Sexton DOB: 04-15-1940 Physician: Narda Amber  Sex: Male Height: 6\' 4"  Ref Phys: [name], M.D.  ID#: [id number] Temp: 35.0C Technician: Jerilynn Mages. Dean   Patient Complaints: This is a 74 year old gentleman presenting for evaluation of bilateral hand weakness, paresthesias, and muscle atrophy.   NCV & EMG Findings: Extensive electrodiagnostic testing of the right upper extremity and additional studies of the left shows:  1. Right median and ulnar sensory responses are absent. Bilateral radial, left median, and left ulnar sensory responses are reduced in amplitude. The left ulnar sensory response is also prolonged period 2. Bilateral median motor responses are mildly prolonged (L4.1, R4.3 ms), with preserved amplitude. Right ulnar motor response shows prolonged latency at the abductor digiti minimi and first dorsal interosseous (FDI) muscle as well as reduced amplitude at the FDI. The right ulnar motor nerve shows borderline conduction velocity slowing along its course. The left ulnar motor nerve is within normal limits. 3. Rapid recruitment pattern is seen affecting the C8 myotomes bilaterally with sparse motor unit configuration changes. There is no evidence of active denervation.   Impression: 1. 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 Overall, these findings are moderate in degree electrically and worse on the right side. 2. A superimposed C8 radiculopathy affecting the upper extremities is also thought to be likely. Clinical correlation recommended.   ___________________________ Narda Amber, DO    Nerve Conduction Studies Anti Sensory Summary Table   Stim Site NR Peak (ms) Norm Peak (ms) P-T Amp (V) Norm  P-T Amp  Left Median Anti Sensory (2nd Digit)  35C  Wrist    3.6 <3.8 3.3 >10  Right Median Anti Sensory (2nd Digit)  35C  Wrist NR  <3.8  >10  Left Radial Anti Sensory (Base 1st Digit)  35C  Wrist    2.0 <2.8 5.6 >10  Right Radial Anti Sensory (Base 1st Digit)  35C  Wrist    2.4 <2.8 6.8 >10  Left Ulnar Anti Sensory (5th Digit)  35C  Wrist    3.9 <3.2 4.9 >5  Right Ulnar Anti Sensory (5th Digit)  35C  Wrist NR  <3.2  >5   Motor Summary Table   Stim Site NR Onset (ms) Norm Onset (ms) O-P Amp (mV) Norm O-P Amp Site1 Site2 Delta-0 (ms) Dist (cm) Vel (m/s) Norm Vel (m/s)  Left Median Motor (Abd Poll Brev)  35C  Wrist    4.1 <4.0 5.5 >5 Elbow Wrist 5.9 31.0 53 >50  Elbow    10.0  4.6         Right Median Motor (Abd Poll Brev)  35C  Wrist    4.3 <4.0 7.8 >5 Elbow Wrist 5.8 30.0 52 >50  Elbow    10.1  7.0         Left Ulnar Motor (Abd Dig Minimi)  35C  Wrist    3.0 <3.1 9.6 >7 B Elbow Wrist 5.2 27.0 52 >50  B Elbow    8.2  9.0  A Elbow B Elbow 1.7 10.0 59 >50  A Elbow    9.9  8.8         Right Ulnar Motor (Abd Dig Minimi)  35C  Wrist    3.9 <3.1 9.5 >7 B Elbow Wrist 5.7 28.0 49 >50  B Elbow    9.6  8.8  A Elbow B Elbow 1.7 10.0 59 >50  A Elbow    11.3  8.4         Right Ulnar (FDI) Motor (1st DI)  35C  Wrist    5.8 <4.5 4.3 >7 B Elbow Wrist 5.8 28.0 48 >50  B Elbow    11.6  3.7  A Elbow B Elbow 2.1 10.0 48 >50  A Elbow    13.7  3.7          EMG   Side Muscle Ins Act Fibs Psw Fasc Number Recrt Dur Dur. Amp Amp. Poly Poly. Comment  Right 1stDorInt Nml Nml Nml Nml 2- Rapid Nml Nml Nml Nml Nml Nml N/A  Right Abd Poll Brev Nml Nml Nml Nml Nml Nml Nml Nml Nml Nml Nml Nml N/A  Right FlexPolLong Nml Nml Nml Nml Nml Nml Nml Nml Nml Nml Nml Nml N/A  Right Ext Indicis Nml Nml Nml Nml 1- Rapid Few 1+ Few 1+ Nml Nml N/A  Right PronatorTeres Nml Nml Nml Nml Nml Nml Nml Nml Nml Nml Nml Nml N/A  Right Biceps Nml Nml Nml Nml Nml Nml Nml Nml Nml Nml Nml Nml N/A  Right Triceps Nml Nml  Nml Nml 1- Mod-R Few 1+ Nml Nml Nml Nml N/A  Right Deltoid Nml Nml Nml Nml Nml Nml Nml Nml Nml Nml Nml Nml N/A  Right FlexDigProf 4,5 Nml Nml Nml Nml 1- Mod-R Few 1+ Nml Nml Nml Nml N/A  Left 1stDorInt Nml Nml Nml Nml 1- Mod-R Few 1+ Few 1+ Nml Nml N/A  Left Abd Poll Brev Nml Nml Nml Nml Nml Nml Nml Nml Nml Nml Nml Nml N/A  Left FlexPolLong Nml Nml Nml Nml Nml Nml Nml Nml Nml Nml Nml Nml N/A  Left Ext Indicis Nml Nml Nml Nml Nml Nml Nml Nml Nml Nml Nml Nml N/A  Left PronatorTeres Nml Nml Nml Nml Nml Nml Nml Nml Nml Nml Nml Nml N/A  Left Biceps Nml Nml Nml Nml Nml Nml Nml Nml Nml Nml Nml Nml N/A  Left Triceps Nml Nml Nml Nml 1- Mod-V Few 1+ Few 1+ Nml Nml N/A  Left Deltoid Nml Nml Nml Nml Nml Nml Nml Nml Nml Nml Nml Nml N/A      Waveforms:

## 2014-11-12 NOTE — Progress Notes (Addendum)
GU Location of Tumor / Histology: Adenocarcinoma of the Prostate   If Prostate Cancer, Gleason Score is (3 + 4) and PSA is (10.10 at time of Biopsy) , PSA 13.26 on 06/26/2014  Kurt Sexton has been on active surveillance since November 2009 was found have Gleason 6 in 5-20% of 2 cores along the left mid gland. His PSA at the time of diagnosis was 4.93. Repeat biopsies in 2010 and 2013 showed atypia. His PSA rose to 10.1 on 02/19/2013 and he underwent repeat biopsies on 03/27/2013. His was found to have Gleason 7 (3+4) involving 20% of one core from the right apex and also Gleason 6 (3+3) involving less than 5% of one core from the right mid gland, less than 5% of one core from the left lateral apex and 5% of one core from the left apex. There was a atypia from the left lateral mid gland. His gland volume was approximately 34 cc. A repeat PSA on February 2 from an outside lab was down to 7.5. He is doing well from a GU and GI standpoint. His I PSS score today is 4. He does have erectile dysfunction which responds well to Levitra. His performance status is excellent.   Past/Anticipated interventions by urology, if any: Prostate Biopsy and Active survelliance  Past/Anticipated interventions by medical oncology, if any: None  Weight changes, if any: NO  Bowel/Bladder complaints, if any: {No dysuria, no hematuria, good stream,some increased frequency urgency,nocturia x3, regular bowel movements  Nausea/Vomiting, if any: NO  Pain issues, if any:  NO  SAFETY ISSUES: yes, walks with cane, slight unsteady  Prior radiation? No  Pacemaker/ICD? No  Possible current pregnancy? N/A  Is the patient on methotrexate? Yes - 7.5 mg po Twice Daily  Current Complaints / other details: I-PSS Score=8 BP 173/76 mmHg  Pulse 80  Temp(Src) 97.6 F (36.4 C) (Oral)  Resp 20  Ht 6' 3.5" (1.918 m)  Wt 228 lb (103.42 kg)  BMI 28.11 kg/m2  SpO2 100%  Wt Readings from Last 3 Encounters:  11/13/14 228 lb  (103.42 kg)  11/08/14 231 lb (104.781 kg)  10/24/14 240 lb (108.863 kg)

## 2014-11-13 ENCOUNTER — Encounter: Payer: Self-pay | Admitting: Radiation Oncology

## 2014-11-13 ENCOUNTER — Other Ambulatory Visit: Payer: Self-pay | Admitting: *Deleted

## 2014-11-13 ENCOUNTER — Ambulatory Visit
Admission: RE | Admit: 2014-11-13 | Discharge: 2014-11-13 | Disposition: A | Discharge status: Home / Self Care | Payer: Medicare Other | Source: Ambulatory Visit | Attending: Radiation Oncology | Admitting: Radiation Oncology

## 2014-11-13 VITALS — BP 173/76 | HR 80 | Temp 97.6°F | Resp 20 | Ht 75.5 in | Wt 228.0 lb

## 2014-11-13 DIAGNOSIS — R29898 Other symptoms and signs involving the musculoskeletal system: Secondary | ICD-10-CM

## 2014-11-13 DIAGNOSIS — M5412 Radiculopathy, cervical region: Secondary | ICD-10-CM

## 2014-11-13 DIAGNOSIS — C61 Malignant neoplasm of prostate: Secondary | ICD-10-CM

## 2014-11-13 DIAGNOSIS — R202 Paresthesia of skin: Secondary | ICD-10-CM

## 2014-11-13 NOTE — Progress Notes (Addendum)
CC: Dr. Lowella Bandy, Dr. [name], Dr. 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 [name] for discussion of possible radiation therapy in the management of his stage TIc intermediate to high risk adenocarcinoma prostate.  I saw the patient in consultation on 05/01/2013.  He has been on active surveillance since November 2009 was found have Gleason 6 in 5-20% of 2 cores along the left mid gland. His PSA at the time of diagnosis was 4.93. Repeat biopsies in 2010 and 2013 showed atypia. His PSA rose to 10.1 on 02/19/2013 and he underwent repeat biopsies on 03/27/2013. His was found to have Gleason 7 (3+4) involving 20% of one core from the right apex and also Gleason 6 (3+3) involving less than 5% of one core from the right mid gland, less than 5% of one core from the left lateral apex and 5% of one core from the left apex. There was a atypia from the left lateral mid gland. His gland volume was approximately 34 cc. A repeat PSA on 04/23/2013 from an outside lab was down to 7.5.  His I PSS score that time was 4.  He takes Levitra for erectile dysfunction.  He was seen by Dr. [name] on 09/06/2014.  His PSA had gone up to 13.16 on 08/23/2014 from 10.1 in December 2014.  He indicated willingness to have curative treatment.  Dr. [name] felt that it would be appropriate to offer him definitive curative therapy.  He is not had repeat biopsies.  Since the last time I saw him he tells me that he is been having swelling of his right lower extremity, primarily along the ankle and foot.  He describes having a Doppler study and was told that he did not have a blood clot.  He is also been receiving methotrexate for the past 2-3 months a few days a week for eczema involving his hands, ankles and feet.  Yesterday, he had a nerve conduction study which shows a  generalized sensorimotor polyneuropathy and is being evaluated by neurology.  He is also felt to have a C8 radiculopathy.  Otherwise, he generally feels well.  His I PSS score today is 8.  Physical examination: Alert and oriented. Filed Vitals:   11/13/14 0758  BP: 173/76  Pulse: 80  Temp: 97.6 F (36.4 C)  Resp: 20   Rectal examination: The gland is normal size and is without focal induration or nodularity.  Extremities: He does have a desquamating erythematous rash along his hands, ankle and feet.  Laboratory data: PSA 13.16 from 08/23/2014  Impression: Intermediate to high risk adenocarcinoma prostate.  I explained to the patient that his prognosis is related to his stage, PSA level, and Gleason score.  His stage is favorable, but his Gleason score of 7 and PSA of over 10 places him in the high intermediate to high-risk group.  I think it would be appropriate to offer him curative therapy provided that he has at least a 5 year life expectancy based on his medical comorbidities.  I will check with Dr. Alain Marion to get an impression about his overall medical condition.  The patient is interested in proceeding with curative therapy and I think external beam/IMRT would be the best choice for him.  I do not feel that he needs repeat biopsies at this time.  I would need to have Dr.  Nesi placed 3 gold seed markers a prior to CT simulation.  However, before scheduling anything, I'll check with Dr. Alain Marion.  Consent is signed today.  Lastly, he would need to stop his methotrexate during his radiation therapy if this is possible considering the status of his eczema.  I believe that he has seen Dr. Ubaldo Glassing in this regard.  We may need to wait for completion of his neurologic evaluation to assess his medical comorbidities.  Plan: As discussed above.  30 minutes was spent face-to-face with the patient, primarily counseling patient and coordinating his care.

## 2014-11-13 NOTE — Progress Notes (Signed)
Please see the Nurse Progress Note in the MD Initial Consult Encounter for this patient. 

## 2014-11-14 ENCOUNTER — Encounter: Payer: Self-pay | Admitting: Radiation Oncology

## 2014-11-14 NOTE — Progress Notes (Addendum)
CC: Dr. Lowella Bandy  Dr. Alain Marion feels that Mr. Horn may very well have a 5 year life expectancy, and he would be able to stop methotrexate during radiation therapy.  Therefore, I will asked Dr. [name] to placed 3 gold seed markers and then have him return for CT simulation.

## 2014-11-14 NOTE — Addendum Note (Signed)
Encounter addended by: Arloa Koh, MD on: 11/14/2014  6:37 PM<BR>     Documentation filed: Notes Section

## 2014-11-15 ENCOUNTER — Telehealth: Payer: Self-pay | Admitting: *Deleted

## 2014-11-15 NOTE — Telephone Encounter (Signed)
Called patient to inform of gold seed placement on 12/12/14- arrival time - 2:30 pm @ Dr. [name]' Office and his sim on 12-23-14 @ 10 am @ Dr. Charlton Amor Office, lvm for a return call

## 2014-11-21 ENCOUNTER — Ambulatory Visit
Admission: RE | Admit: 2014-11-21 | Discharge: 2014-11-21 | Disposition: A | Discharge status: Home / Self Care | Payer: Medicare Other | Source: Ambulatory Visit | Attending: Neurology | Admitting: Neurology

## 2014-11-21 ENCOUNTER — Telehealth: Payer: Self-pay | Admitting: Neurology

## 2014-11-21 ENCOUNTER — Encounter (HOSPITAL_COMMUNITY): Payer: Self-pay | Admitting: *Deleted

## 2014-11-21 DIAGNOSIS — M5022 Other cervical disc displacement, mid-cervical region: Secondary | ICD-10-CM | POA: Diagnosis not present

## 2014-11-21 DIAGNOSIS — M5412 Radiculopathy, cervical region: Secondary | ICD-10-CM

## 2014-11-21 DIAGNOSIS — G959 Disease of spinal cord, unspecified: Secondary | ICD-10-CM | POA: Diagnosis not present

## 2014-11-21 DIAGNOSIS — M4802 Spinal stenosis, cervical region: Secondary | ICD-10-CM | POA: Diagnosis not present

## 2014-11-21 DIAGNOSIS — R29898 Other symptoms and signs involving the musculoskeletal system: Secondary | ICD-10-CM

## 2014-11-21 DIAGNOSIS — R202 Paresthesia of skin: Secondary | ICD-10-CM

## 2014-11-21 NOTE — Anesthesia Preprocedure Evaluation (Addendum)
Anesthesia Evaluation  Patient identified by MRN, date of birth, ID band Patient awake    Reviewed: Allergy & Precautions, NPO status , Patient's Chart, lab work & pertinent test results  Airway Mallampati: II   Neck ROM: Full    Dental  (+) Edentulous Upper, Dental Advisory Given   Pulmonary neg pulmonary ROS,  breath sounds clear to auscultation        Cardiovascular hypertension, Pt. on medications + Peripheral Vascular Disease and + DOE Rhythm:Regular  ECHO 2015 EF 70%, EKG 09/2013 WNL   Neuro/Psych  Headaches, TIACVA    GI/Hepatic negative GI ROS, Neg liver ROS,   Endo/Other  negative endocrine ROS  Renal/GU negative Renal ROS   Active Adeno prostate CA    Musculoskeletal   Abdominal   Peds  Hematology  (+) anemia , 9/30   Anesthesia Other Findings   Reproductive/Obstetrics                            Anesthesia Physical Anesthesia Plan  ASA: III  Anesthesia Plan: General   Post-op Pain Management:    Induction: Intravenous  Airway Management Planned: Oral ETT and Video Laryngoscope Planned  Additional Equipment:   Intra-op Plan:   Post-operative Plan: Extubation in OR  Informed Consent: I have reviewed the patients History and Physical, chart, labs and discussed the procedure including the risks, benefits and alternatives for the proposed anesthesia with the patient or authorized representative who has indicated his/her understanding and acceptance.     Plan Discussed with:   Anesthesia Plan Comments: (Glide available)       Anesthesia Quick Evaluation

## 2014-11-21 NOTE — Telephone Encounter (Signed)
Talked to Dr. Nevada Crane about cervical spine findings and reviewed myself.  Pt had already left facility.  Dr. Vertell Limber on call for neurosx.  Dr. Vertell Limber reviewed MRI films and they will contact pt about when to go to hospital as pt is going to need surgical intervention.

## 2014-11-21 NOTE — Progress Notes (Signed)
Pt denies SOB, chest pain, and being under the care of a cardiologist. Pt denies having a stress test and cardiac cath. Pt denies having a chest x ray and EKG within the last year. Pt made aware to stop  taking Aspirin,otc vitamins and herbal medications. Do not take any NSAIDs ie: Ibuprofen, Advil, Naproxen or any medication containing Aspirin. Pt verbalized understanding of all pre-op instructions.

## 2014-11-22 ENCOUNTER — Encounter (HOSPITAL_COMMUNITY): Payer: Self-pay | Admitting: Certified Registered Nurse Anesthetist

## 2014-11-22 ENCOUNTER — Inpatient Hospital Stay (HOSPITAL_COMMUNITY): Payer: Medicare Other | Admitting: Anesthesiology

## 2014-11-22 ENCOUNTER — Inpatient Hospital Stay (HOSPITAL_COMMUNITY)
Admission: RE | Admit: 2014-11-22 | Discharge: 2014-11-25 | DRG: 473 | Disposition: A | Discharge status: Home Health | Payer: Medicare Other | Source: Ambulatory Visit | Attending: Neurosurgery | Admitting: Neurosurgery

## 2014-11-22 ENCOUNTER — Inpatient Hospital Stay (HOSPITAL_COMMUNITY): Payer: Medicare Other

## 2014-11-22 ENCOUNTER — Encounter (HOSPITAL_COMMUNITY)
Admission: RE | Disposition: A | Discharge status: Home Health | Payer: Self-pay | Source: Ambulatory Visit | Attending: Neurosurgery

## 2014-11-22 DIAGNOSIS — Z419 Encounter for procedure for purposes other than remedying health state, unspecified: Secondary | ICD-10-CM

## 2014-11-22 DIAGNOSIS — I10 Essential (primary) hypertension: Secondary | ICD-10-CM | POA: Diagnosis present

## 2014-11-22 DIAGNOSIS — M5022 Other cervical disc displacement, mid-cervical region: Secondary | ICD-10-CM | POA: Diagnosis not present

## 2014-11-22 DIAGNOSIS — Z8673 Personal history of transient ischemic attack (TIA), and cerebral infarction without residual deficits: Secondary | ICD-10-CM | POA: Diagnosis not present

## 2014-11-22 DIAGNOSIS — Z8546 Personal history of malignant neoplasm of prostate: Secondary | ICD-10-CM

## 2014-11-22 DIAGNOSIS — M5020 Other cervical disc displacement, unspecified cervical region: Secondary | ICD-10-CM | POA: Diagnosis not present

## 2014-11-22 DIAGNOSIS — M62541 Muscle wasting and atrophy, not elsewhere classified, right hand: Secondary | ICD-10-CM | POA: Diagnosis present

## 2014-11-22 DIAGNOSIS — Z9889 Other specified postprocedural states: Secondary | ICD-10-CM | POA: Diagnosis not present

## 2014-11-22 DIAGNOSIS — M5012 Cervical disc disorder with radiculopathy, mid-cervical region: Secondary | ICD-10-CM | POA: Diagnosis not present

## 2014-11-22 DIAGNOSIS — M4802 Spinal stenosis, cervical region: Secondary | ICD-10-CM | POA: Diagnosis not present

## 2014-11-22 DIAGNOSIS — M109 Gout, unspecified: Secondary | ICD-10-CM | POA: Diagnosis not present

## 2014-11-22 DIAGNOSIS — N400 Enlarged prostate without lower urinary tract symptoms: Secondary | ICD-10-CM | POA: Diagnosis not present

## 2014-11-22 DIAGNOSIS — G959 Disease of spinal cord, unspecified: Secondary | ICD-10-CM | POA: Diagnosis present

## 2014-11-22 DIAGNOSIS — M1990 Unspecified osteoarthritis, unspecified site: Secondary | ICD-10-CM | POA: Diagnosis not present

## 2014-11-22 DIAGNOSIS — M5002 Cervical disc disorder with myelopathy, mid-cervical region: Principal | ICD-10-CM | POA: Diagnosis present

## 2014-11-22 DIAGNOSIS — R531 Weakness: Secondary | ICD-10-CM | POA: Diagnosis present

## 2014-11-22 HISTORY — PX: ANTERIOR CERVICAL DECOMP/DISCECTOMY FUSION: SHX1161

## 2014-11-22 LAB — BASIC METABOLIC PANEL
ANION GAP: 6 (ref 5–15)
BUN: 11 mg/dL (ref 6–20)
CALCIUM: 8.9 mg/dL (ref 8.9–10.3)
CO2: 26 mmol/L (ref 22–32)
CREATININE: 1.12 mg/dL (ref 0.61–1.24)
Chloride: 107 mmol/L (ref 101–111)
Glucose, Bld: 106 mg/dL — ABNORMAL HIGH (ref 65–99)
Potassium: 4.6 mmol/L (ref 3.5–5.1)
SODIUM: 139 mmol/L (ref 135–145)

## 2014-11-22 LAB — CBC
HCT: 31.8 % — ABNORMAL LOW (ref 39.0–52.0)
HEMOGLOBIN: 9.9 g/dL — AB (ref 13.0–17.0)
MCH: 26.2 pg (ref 26.0–34.0)
MCHC: 31.1 g/dL (ref 30.0–36.0)
MCV: 84.1 fL (ref 78.0–100.0)
PLATELETS: 277 10*3/uL (ref 150–400)
RBC: 3.78 MIL/uL — AB (ref 4.22–5.81)
RDW: 15 % (ref 11.5–15.5)
WBC: 7 10*3/uL (ref 4.0–10.5)

## 2014-11-22 LAB — SURGICAL PCR SCREEN
MRSA, PCR: NEGATIVE
STAPHYLOCOCCUS AUREUS: POSITIVE — AB

## 2014-11-22 SURGERY — ANTERIOR CERVICAL DECOMPRESSION/DISCECTOMY FUSION 2 LEVELS
Anesthesia: General | Site: Spine Cervical

## 2014-11-22 MED ORDER — PROPOFOL 10 MG/ML IV BOLUS
INTRAVENOUS | Status: DC | PRN
  Administered 2014-11-22: 150 mg via INTRAVENOUS
  Administered 2014-11-22: 50 mg via INTRAVENOUS

## 2014-11-22 MED ORDER — LABETALOL HCL 200 MG PO TABS
200.0000 mg | ORAL_TABLET | Freq: Three times a day (TID) | ORAL | Status: DC
  Administered 2014-11-22 – 2014-11-25 (×8): 200 mg via ORAL
  Filled 2014-11-22 (×8): qty 1

## 2014-11-22 MED ORDER — HYDROCODONE-ACETAMINOPHEN 5-325 MG PO TABS: 1.0000 | ORAL_TABLET | ORAL | Status: DC | PRN

## 2014-11-22 MED ORDER — HYDROXYZINE HCL 25 MG PO TABS: 25.0000 mg | ORAL_TABLET | Freq: Three times a day (TID) | ORAL | Status: DC | PRN

## 2014-11-22 MED ORDER — PROPOFOL 10 MG/ML IV BOLUS
INTRAVENOUS | Status: AC
  Filled 2014-11-22: qty 20

## 2014-11-22 MED ORDER — NEOSTIGMINE METHYLSULFATE 10 MG/10ML IV SOLN
INTRAVENOUS | Status: DC | PRN
  Administered 2014-11-22: 5 mg via INTRAVENOUS

## 2014-11-22 MED ORDER — DEXAMETHASONE 4 MG PO TABS
4.0000 mg | ORAL_TABLET | Freq: Four times a day (QID) | ORAL | Status: DC
  Administered 2014-11-22 – 2014-11-25 (×10): 4 mg via ORAL
  Filled 2014-11-22 (×10): qty 1

## 2014-11-22 MED ORDER — HEMOSTATIC AGENTS (NO CHARGE) OPTIME
TOPICAL | Status: DC | PRN
  Administered 2014-11-22: 1 via TOPICAL

## 2014-11-22 MED ORDER — MEPERIDINE HCL 25 MG/ML IJ SOLN: 6.2500 mg | INTRAMUSCULAR | Status: DC | PRN

## 2014-11-22 MED ORDER — LACTATED RINGERS IV SOLN
INTRAVENOUS | Status: DC
  Administered 2014-11-22: 14:00:00 via INTRAVENOUS

## 2014-11-22 MED ORDER — CEFAZOLIN SODIUM-DEXTROSE 2-3 GM-% IV SOLR
INTRAVENOUS | Status: AC
  Administered 2014-11-22: 2 g via INTRAVENOUS
  Filled 2014-11-22: qty 50

## 2014-11-22 MED ORDER — LIDOCAINE HCL (CARDIAC) 20 MG/ML IV SOLN
INTRAVENOUS | Status: AC
  Filled 2014-11-22: qty 5

## 2014-11-22 MED ORDER — BISACODYL 10 MG RE SUPP: 10.0000 mg | Freq: Every day | RECTAL | Status: DC | PRN

## 2014-11-22 MED ORDER — HYDROMORPHONE HCL 1 MG/ML IJ SOLN
INTRAMUSCULAR | Status: AC
Start: 2014-11-22 — End: 2014-11-23
  Filled 2014-11-22: qty 1

## 2014-11-22 MED ORDER — ARTIFICIAL TEARS OP OINT
TOPICAL_OINTMENT | OPHTHALMIC | Status: DC | PRN
  Administered 2014-11-22: 1 via OPHTHALMIC

## 2014-11-22 MED ORDER — FENTANYL CITRATE (PF) 250 MCG/5ML IJ SOLN
INTRAMUSCULAR | Status: AC
  Filled 2014-11-22: qty 5

## 2014-11-22 MED ORDER — ACETAMINOPHEN 650 MG RE SUPP: 650.0000 mg | RECTAL | Status: DC | PRN

## 2014-11-22 MED ORDER — LIDOCAINE-EPINEPHRINE 1 %-1:100000 IJ SOLN
INTRAMUSCULAR | Status: DC | PRN
  Administered 2014-11-22: 4 mL

## 2014-11-22 MED ORDER — MUPIROCIN 2 % EX OINT
1.0000 "application " | TOPICAL_OINTMENT | Freq: Once | CUTANEOUS | Status: AC
  Administered 2014-11-22: 1 via TOPICAL

## 2014-11-22 MED ORDER — ONDANSETRON HCL 4 MG/2ML IJ SOLN
INTRAMUSCULAR | Status: AC
  Filled 2014-11-22: qty 2

## 2014-11-22 MED ORDER — MIDAZOLAM HCL 5 MG/5ML IJ SOLN
INTRAMUSCULAR | Status: DC | PRN
  Administered 2014-11-22: 2 mg via INTRAVENOUS

## 2014-11-22 MED ORDER — POLYETHYLENE GLYCOL 3350 17 G PO PACK: 17.0000 g | PACK | Freq: Every day | ORAL | Status: DC | PRN

## 2014-11-22 MED ORDER — PANTOPRAZOLE SODIUM 40 MG IV SOLR
40.0000 mg | Freq: Every day | INTRAVENOUS | Status: DC
  Administered 2014-11-22: 40 mg via INTRAVENOUS
  Filled 2014-11-22: qty 40

## 2014-11-22 MED ORDER — DEXAMETHASONE SODIUM PHOSPHATE 10 MG/ML IJ SOLN
INTRAMUSCULAR | Status: DC | PRN
  Administered 2014-11-22: 10 mg via INTRAVENOUS

## 2014-11-22 MED ORDER — TRIAMCINOLONE ACETONIDE 0.1 % EX OINT: 1.0000 "application " | TOPICAL_OINTMENT | Freq: Two times a day (BID) | CUTANEOUS | Status: DC | PRN

## 2014-11-22 MED ORDER — MUPIROCIN 2 % EX OINT
TOPICAL_OINTMENT | CUTANEOUS | Status: AC
  Filled 2014-11-22: qty 22

## 2014-11-22 MED ORDER — THROMBIN 5000 UNITS EX SOLR
CUTANEOUS | Status: DC | PRN
  Administered 2014-11-22: 16:00:00 via TOPICAL

## 2014-11-22 MED ORDER — FLEET ENEMA 7-19 GM/118ML RE ENEM: 1.0000 | ENEMA | Freq: Once | RECTAL | Status: DC | PRN

## 2014-11-22 MED ORDER — CEFAZOLIN SODIUM-DEXTROSE 2-3 GM-% IV SOLR
2.0000 g | Freq: Three times a day (TID) | INTRAVENOUS | Status: AC
  Administered 2014-11-22 – 2014-11-23 (×2): 2 g via INTRAVENOUS
  Filled 2014-11-22 (×2): qty 50

## 2014-11-22 MED ORDER — METHOCARBAMOL 1000 MG/10ML IJ SOLN
500.0000 mg | Freq: Four times a day (QID) | INTRAVENOUS | Status: DC | PRN
  Filled 2014-11-22: qty 5

## 2014-11-22 MED ORDER — FUROSEMIDE 40 MG PO TABS: 40.0000 mg | ORAL_TABLET | Freq: Every day | ORAL | Status: DC | PRN

## 2014-11-22 MED ORDER — ONDANSETRON HCL 4 MG/2ML IJ SOLN
INTRAMUSCULAR | Status: DC | PRN
  Administered 2014-11-22: 4 mg via INTRAVENOUS

## 2014-11-22 MED ORDER — KCL IN DEXTROSE-NACL 20-5-0.45 MEQ/L-%-% IV SOLN
INTRAVENOUS | Status: DC
  Administered 2014-11-23: 05:00:00 via INTRAVENOUS
  Filled 2014-11-22: qty 1000

## 2014-11-22 MED ORDER — FENTANYL CITRATE (PF) 100 MCG/2ML IJ SOLN
INTRAMUSCULAR | Status: DC | PRN
  Administered 2014-11-22: 100 ug via INTRAVENOUS
  Administered 2014-11-22 (×2): 50 ug via INTRAVENOUS

## 2014-11-22 MED ORDER — PROMETHAZINE HCL 25 MG/ML IJ SOLN: 6.2500 mg | INTRAMUSCULAR | Status: DC | PRN

## 2014-11-22 MED ORDER — LIDOCAINE HCL (CARDIAC) 20 MG/ML IV SOLN
INTRAVENOUS | Status: DC | PRN
  Administered 2014-11-22: 100 mg via INTRAVENOUS

## 2014-11-22 MED ORDER — ROCURONIUM BROMIDE 50 MG/5ML IV SOLN
INTRAVENOUS | Status: AC
  Filled 2014-11-22: qty 1

## 2014-11-22 MED ORDER — PHENOL 1.4 % MT LIQD: 1.0000 | OROMUCOSAL | Status: DC | PRN

## 2014-11-22 MED ORDER — SODIUM CHLORIDE 0.9 % IV SOLN: 250.0000 mL | INTRAVENOUS | Status: DC

## 2014-11-22 MED ORDER — BUPIVACAINE HCL (PF) 0.5 % IJ SOLN
INTRAMUSCULAR | Status: DC | PRN
  Administered 2014-11-22: 4 mL

## 2014-11-22 MED ORDER — MECLIZINE HCL 12.5 MG PO TABS: 12.5000 mg | ORAL_TABLET | Freq: Three times a day (TID) | ORAL | Status: DC | PRN

## 2014-11-22 MED ORDER — TRIAMCINOLONE ACETONIDE 0.5 % EX CREA: TOPICAL_CREAM | Freq: Three times a day (TID) | CUTANEOUS | Status: DC

## 2014-11-22 MED ORDER — DEXAMETHASONE SODIUM PHOSPHATE 10 MG/ML IJ SOLN
INTRAMUSCULAR | Status: AC
  Filled 2014-11-22: qty 1

## 2014-11-22 MED ORDER — GLYCOPYRROLATE 0.2 MG/ML IJ SOLN
INTRAMUSCULAR | Status: DC | PRN
  Administered 2014-11-22: 0.1 mg via INTRAVENOUS
  Administered 2014-11-22: .8 mg via INTRAVENOUS
  Administered 2014-11-22: 0.1 mg via INTRAVENOUS

## 2014-11-22 MED ORDER — ONDANSETRON HCL 4 MG/2ML IJ SOLN: 4.0000 mg | INTRAMUSCULAR | Status: DC | PRN

## 2014-11-22 MED ORDER — MIDAZOLAM HCL 2 MG/2ML IJ SOLN
INTRAMUSCULAR | Status: AC
  Filled 2014-11-22: qty 4

## 2014-11-22 MED ORDER — FAMOTIDINE 20 MG PO TABS
20.0000 mg | ORAL_TABLET | Freq: Two times a day (BID) | ORAL | Status: DC
  Administered 2014-11-22 – 2014-11-25 (×6): 20 mg via ORAL
  Filled 2014-11-22 (×6): qty 1

## 2014-11-22 MED ORDER — HYDROMORPHONE HCL 1 MG/ML IJ SOLN
0.5000 mg | INTRAMUSCULAR | Status: DC | PRN
  Administered 2014-11-22: 1 mg via INTRAVENOUS
  Filled 2014-11-22: qty 1

## 2014-11-22 MED ORDER — ACETAMINOPHEN 325 MG PO TABS: 650.0000 mg | ORAL_TABLET | ORAL | Status: DC | PRN

## 2014-11-22 MED ORDER — METHOCARBAMOL 500 MG PO TABS: 500.0000 mg | ORAL_TABLET | Freq: Four times a day (QID) | ORAL | Status: DC | PRN

## 2014-11-22 MED ORDER — ALUM & MAG HYDROXIDE-SIMETH 200-200-20 MG/5ML PO SUSP: 30.0000 mL | Freq: Four times a day (QID) | ORAL | Status: DC | PRN

## 2014-11-22 MED ORDER — SODIUM CHLORIDE 0.9 % IJ SOLN: 3.0000 mL | INTRAMUSCULAR | Status: DC | PRN

## 2014-11-22 MED ORDER — SODIUM CHLORIDE 0.9 % IJ SOLN
3.0000 mL | Freq: Two times a day (BID) | INTRAMUSCULAR | Status: DC
  Administered 2014-11-22 – 2014-11-25 (×5): 3 mL via INTRAVENOUS

## 2014-11-22 MED ORDER — ROCURONIUM BROMIDE 100 MG/10ML IV SOLN
INTRAVENOUS | Status: DC | PRN
  Administered 2014-11-22: 50 mg via INTRAVENOUS
  Administered 2014-11-22 (×2): 10 mg via INTRAVENOUS

## 2014-11-22 MED ORDER — DOCUSATE SODIUM 100 MG PO CAPS
100.0000 mg | ORAL_CAPSULE | Freq: Two times a day (BID) | ORAL | Status: DC
Start: 2014-11-22 — End: 2014-11-25
  Administered 2014-11-22 – 2014-11-25 (×6): 100 mg via ORAL
  Filled 2014-11-22 (×6): qty 1

## 2014-11-22 MED ORDER — MENTHOL 3 MG MT LOZG: 1.0000 | LOZENGE | OROMUCOSAL | Status: DC | PRN

## 2014-11-22 MED ORDER — ATORVASTATIN CALCIUM 10 MG PO TABS
10.0000 mg | ORAL_TABLET | Freq: Every day | ORAL | Status: DC
  Administered 2014-11-22 – 2014-11-24 (×3): 10 mg via ORAL
  Filled 2014-11-22 (×3): qty 1

## 2014-11-22 MED ORDER — THROMBIN 5000 UNITS EX SOLR
CUTANEOUS | Status: DC | PRN
  Administered 2014-11-22 (×2): 5000 [IU] via TOPICAL

## 2014-11-22 MED ORDER — DEXAMETHASONE SODIUM PHOSPHATE 4 MG/ML IJ SOLN
4.0000 mg | Freq: Four times a day (QID) | INTRAMUSCULAR | Status: DC
  Administered 2014-11-23: 4 mg via INTRAVENOUS
  Filled 2014-11-22: qty 1

## 2014-11-22 MED ORDER — HYDROMORPHONE HCL 1 MG/ML IJ SOLN
0.2500 mg | INTRAMUSCULAR | Status: DC | PRN
  Administered 2014-11-22: 0.5 mg via INTRAVENOUS
  Administered 2014-11-22: 0.25 mg via INTRAVENOUS

## 2014-11-22 MED ORDER — LACTATED RINGERS IV SOLN
INTRAVENOUS | Status: DC | PRN
  Administered 2014-11-22 (×2): via INTRAVENOUS

## 2014-11-22 MED ORDER — EPHEDRINE SULFATE 50 MG/ML IJ SOLN
INTRAMUSCULAR | Status: DC | PRN
  Administered 2014-11-22 (×4): 5 mg via INTRAVENOUS

## 2014-11-22 MED ORDER — 0.9 % SODIUM CHLORIDE (POUR BTL) OPTIME
TOPICAL | Status: DC | PRN
  Administered 2014-11-22: 1000 mL

## 2014-11-22 MED ORDER — TRIAMCINOLONE ACETONIDE 0.1 % EX CREA
TOPICAL_CREAM | Freq: Three times a day (TID) | CUTANEOUS | Status: DC
  Administered 2014-11-22 – 2014-11-23 (×4): via TOPICAL
  Administered 2014-11-24 (×2): 1 via TOPICAL
  Administered 2014-11-24: 16:00:00 via TOPICAL
  Administered 2014-11-25: 1 via TOPICAL
  Filled 2014-11-22: qty 15

## 2014-11-22 MED ORDER — PHENYLEPHRINE HCL 10 MG/ML IJ SOLN
INTRAMUSCULAR | Status: DC | PRN
  Administered 2014-11-22 (×4): 40 ug via INTRAVENOUS

## 2014-11-22 MED ORDER — OXYCODONE-ACETAMINOPHEN 5-325 MG PO TABS
1.0000 | ORAL_TABLET | ORAL | Status: DC | PRN
  Administered 2014-11-23: 2 via ORAL
  Administered 2014-11-23: 1 via ORAL
  Administered 2014-11-23 (×2): 2 via ORAL
  Administered 2014-11-24 – 2014-11-25 (×3): 1 via ORAL
  Filled 2014-11-22 (×3): qty 1
  Filled 2014-11-22 (×2): qty 2
  Filled 2014-11-22: qty 1
  Filled 2014-11-22: qty 2

## 2014-11-22 MED ORDER — ASPIRIN EC 81 MG PO TBEC
81.0000 mg | DELAYED_RELEASE_TABLET | Freq: Every day | ORAL | Status: DC
  Administered 2014-11-23 – 2014-11-25 (×3): 81 mg via ORAL
  Filled 2014-11-22 (×6): qty 1

## 2014-11-22 SURGICAL SUPPLY — 73 items
ADH SKN CLS APL DERMABOND .7 (GAUZE/BANDAGES/DRESSINGS) ×1
APL SKNCLS STERI-STRIP NONHPOA (GAUZE/BANDAGES/DRESSINGS)
BENZOIN TINCTURE PRP APPL 2/3 (GAUZE/BANDAGES/DRESSINGS) IMPLANT
BIT DRILL ARCHON 2.5X13MM DTS (DRILL) IMPLANT
BIT DRILL NEURO 2X3.1 SFT TUCH (MISCELLANEOUS) ×1 IMPLANT
BLADE ULTRA TIP 2M (BLADE) IMPLANT
BNDG GAUZE ELAST 4 BULKY (GAUZE/BANDAGES/DRESSINGS) IMPLANT
BUR BARREL STRAIGHT FLUTE 4.0 (BURR) ×3 IMPLANT
CANISTER SUCT 3000ML PPV (MISCELLANEOUS) ×3 IMPLANT
CAP END MONOLITH PARA 14 RND (Cap) IMPLANT
CLOSURE WOUND 1/2 X4 (GAUZE/BANDAGES/DRESSINGS)
COVER MAYO STAND STRL (DRAPES) ×3 IMPLANT
DECANTER SPIKE VIAL GLASS SM (MISCELLANEOUS) ×3 IMPLANT
DERMABOND ADVANCED (GAUZE/BANDAGES/DRESSINGS) ×2
DERMABOND ADVANCED .7 DNX12 (GAUZE/BANDAGES/DRESSINGS) ×1 IMPLANT
DRAIN JACKSON PRATT 10MM FLAT (MISCELLANEOUS) ×2 IMPLANT
DRAPE LAPAROTOMY 100X72 PEDS (DRAPES) ×3 IMPLANT
DRAPE MICROSCOPE LEICA (MISCELLANEOUS) IMPLANT
DRAPE POUCH INSTRU U-SHP 10X18 (DRAPES) ×3 IMPLANT
DRAPE PROXIMA HALF (DRAPES) IMPLANT
DRILL ARCHON 2.5X13MM DTS (DRILL) ×3
DRILL NEURO 2X3.1 SOFT TOUCH (MISCELLANEOUS) ×3
DRSG OPSITE POSTOP 3X4 (GAUZE/BANDAGES/DRESSINGS) ×2 IMPLANT
DURAPREP 6ML APPLICATOR 50/CS (WOUND CARE) ×3 IMPLANT
ELECT COATED BLADE 2.86 ST (ELECTRODE) ×3 IMPLANT
ELECT REM PT RETURN 9FT ADLT (ELECTROSURGICAL) ×3
ELECTRODE REM PT RTRN 9FT ADLT (ELECTROSURGICAL) ×1 IMPLANT
ENDCAP MONOLITH PARA 14 RND (Cap) ×4 IMPLANT
EVACUATOR SILICONE 100CC (DRAIN) ×2 IMPLANT
GAUZE SPONGE 4X4 12PLY STRL (GAUZE/BANDAGES/DRESSINGS) IMPLANT
GAUZE SPONGE 4X4 16PLY XRAY LF (GAUZE/BANDAGES/DRESSINGS) IMPLANT
GLOVE BIO SURGEON STRL SZ8 (GLOVE) ×3 IMPLANT
GLOVE BIOGEL PI IND STRL 8 (GLOVE) ×1 IMPLANT
GLOVE BIOGEL PI IND STRL 8.5 (GLOVE) ×1 IMPLANT
GLOVE BIOGEL PI INDICATOR 8 (GLOVE) ×2
GLOVE BIOGEL PI INDICATOR 8.5 (GLOVE) ×2
GLOVE ECLIPSE 8.0 STRL XLNG CF (GLOVE) ×3 IMPLANT
GLOVE EXAM NITRILE LRG STRL (GLOVE) IMPLANT
GLOVE EXAM NITRILE MD LF STRL (GLOVE) IMPLANT
GLOVE EXAM NITRILE XL STR (GLOVE) IMPLANT
GLOVE EXAM NITRILE XS STR PU (GLOVE) IMPLANT
GOWN STRL REUS W/ TWL LRG LVL3 (GOWN DISPOSABLE) IMPLANT
GOWN STRL REUS W/ TWL XL LVL3 (GOWN DISPOSABLE) ×1 IMPLANT
GOWN STRL REUS W/TWL 2XL LVL3 (GOWN DISPOSABLE) ×3 IMPLANT
GOWN STRL REUS W/TWL LRG LVL3 (GOWN DISPOSABLE)
GOWN STRL REUS W/TWL XL LVL3 (GOWN DISPOSABLE) ×3
HALTER HD/CHIN CERV TRACTION D (MISCELLANEOUS) ×3 IMPLANT
KIT BASIN OR (CUSTOM PROCEDURE TRAY) ×3 IMPLANT
KIT ROOM TURNOVER OR (KITS) ×3 IMPLANT
NDL HYPO 18GX1.5 BLUNT FILL (NEEDLE) ×1 IMPLANT
NDL HYPO 25X1 1.5 SAFETY (NEEDLE) ×1 IMPLANT
NDL SPNL 22GX3.5 QUINCKE BK (NEEDLE) ×1 IMPLANT
NEEDLE HYPO 18GX1.5 BLUNT FILL (NEEDLE) ×3 IMPLANT
NEEDLE HYPO 25X1 1.5 SAFETY (NEEDLE) ×3 IMPLANT
NEEDLE SPNL 22GX3.5 QUINCKE BK (NEEDLE) ×3 IMPLANT
NS IRRIG 1000ML POUR BTL (IV SOLUTION) ×3 IMPLANT
PACK LAMINECTOMY NEURO (CUSTOM PROCEDURE TRAY) ×3 IMPLANT
PAD ARMBOARD 7.5X6 YLW CONV (MISCELLANEOUS) ×3 IMPLANT
PIN DISTRACTION 14MM (PIN) ×6 IMPLANT
PLATE ARCHON 2-LEVEL 36MM (Plate) ×2 IMPLANT
RUBBERBAND STERILE (MISCELLANEOUS) IMPLANT
SCREW ARCHON SELFTAP 4.0X13MM (Screw) ×8 IMPLANT
SPACER MONOLITH CORE 12X17MM (Spacer) ×2 IMPLANT
SPONGE INTESTINAL PEANUT (DISPOSABLE) ×3 IMPLANT
SPONGE SURGIFOAM ABS GEL SZ50 (HEMOSTASIS) IMPLANT
STAPLER SKIN PROX WIDE 3.9 (STAPLE) IMPLANT
STRIP CLOSURE SKIN 1/2X4 (GAUZE/BANDAGES/DRESSINGS) IMPLANT
SUT VIC AB 3-0 SH 8-18 (SUTURE) ×3 IMPLANT
SYR 3ML LL SCALE MARK (SYRINGE) ×3 IMPLANT
TOWEL OR 17X24 6PK STRL BLUE (TOWEL DISPOSABLE) ×3 IMPLANT
TOWEL OR 17X26 10 PK STRL BLUE (TOWEL DISPOSABLE) ×3 IMPLANT
TRAP SPECIMEN MUCOUS 40CC (MISCELLANEOUS) IMPLANT
WATER STERILE IRR 1000ML POUR (IV SOLUTION) ×3 IMPLANT

## 2014-11-22 NOTE — Progress Notes (Signed)
Patient's BP went up to 194/91 HR 77 and his pain is a 3 / 10 . Dr. Oletta Lamas notified.  Ordered to call Dr. Vertell Limber to see what he wanted to do with the patient going over to the floor. Dr. Hal Neer was on call returned my page. Upon his return page patient's BP 181/93 HR 81, DR. Krtizer States to give him a dose of his home medication labetalol and go over to the floor. Orders released and pharmacy notified. Johnny RN aware of situation at patient hand off. No complaints from patient.

## 2014-11-22 NOTE — Brief Op Note (Signed)
11/22/2014  4:21 PM  PATIENT:  Kurt Sexton  74 y.o. 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   POST-OPERATIVE DIAGNOSIS: Same  PROCEDURE:  Procedure(s): ANTERIOR CERVICAL C6 Corpectomy with PEEK corpectomy graft, autograft and anterior cervical plate (N/A)  SURGEON:  Surgeon(s) and Role:    * Erline Levine, MD - Primary    * Karie Chimera, MD - Assisting  PHYSICIAN ASSISTANT:   ASSISTANTS: Poteat, RN   ANESTHESIA:   general  EBL:  Total I/O In: 1000 [I.V.:1000] Out: 150 [Blood:150]  BLOOD ADMINISTERED:none  DRAINS: (10) Jackson-Pratt drain(s) with closed bulb suction in the prevertebral space   LOCAL MEDICATIONS USED:  LIDOCAINE   SPECIMEN:  No Specimen  DISPOSITION OF SPECIMEN:  N/A  COUNTS:  YES  TOURNIQUET:  * No tourniquets in log *  DICTATION: DICTATION: Patient is 74 year old male with severe cervical myelopathy and weakness with HNP, spondylosis, stenosis, radiculopathy C5/6 and C6/7.  Because of the large size of the disc herniation, which extends behind the entirety of the C 6 vertebral body, it was elected to perform a corpectomy of C 6.  PROCEDURE: Patient was brought to operating room and following the smooth and uncomplicated induction of general endotracheal anesthesia his head was placed on a horseshoe head holder he was placed in 5 pounds of Holter traction and her anterior neck was prepped and draped in usual sterile fashion. An incision was made on the left side of midline after infiltrating the skin and subcutaneous tissues with local lidocaine. The platysmal layer was incised and subplatysmal dissection was performed exposing the anterior border sternocleidomastoid muscle. Using blunt dissection the carotid sheath was kept lateral and trachea and esophagus kept medial exposing the anterior cervical spine. A bent spinal needle was placed it was felt to be the C5/6 and C 67 levels and this  was confirmed on intraoperative x-ray. Longus coli muscles were taken down from the anterior cervical spine using electrocautery and key elevator and self-retaining retractor was placed exposing the C5/6 and C6/7 levels. The interspaces were incised and a thorough discectomy was performed. Distraction pins were placed at C 5 and C 7. A corpectomy of C 6 was performed with high speed drill and bone was saved for later grafting.   The spinal cord dura and both C6 and C 7 nerve roots were widely decompressed. The large disc herniation and posterior ligament was removed and the spinal cord dura was widely decompressed.  Hemostasis was assured. After trial sizing a 23 mm peek corpectomy cage was selected and packed with autograft. This was tamped into position and countersunk appropriately. C-arm was used to confirm positioning of implant.  Distraction weight was removed. A 36 mm Nuvasive Archon anterior cervical plate was affixed to the cervical spine with 13 mm fixed-angle screws 2 at C5, and 2 at C7. All screws were well-positioned and locking mechanisms were engaged. A final X ray was obtained which showed well positioned grafts and anterior plate without complicating features. Soft tissues were inspected and found to be in good repair. The wound was irrigated.  A # 10 JP drain was placed.   The platysma layer was closed with 3-0 Vicryl stitches and the skin was reapproximated with 3-0 Vicryl subcuticular stitches. The wound was dressed with Dermabond. Counts were correct at the end of the case. Patient was extubated and taken to recovery in stable and satisfactory condition.    PLAN OF  CARE: Admit to inpatient   PATIENT DISPOSITION:  PACU - hemodynamically stable.   Delay start of Pharmacological VTE agent (>24hrs) due to surgical blood loss or risk of bleeding: yes

## 2014-11-22 NOTE — Anesthesia Postprocedure Evaluation (Signed)
  Anesthesia Post-op Note  Patient: Kurt Sexton  Procedure(s) Performed: Procedure(s): Cervical Six Corpectomy with graft and plating Cervical Five-Seven (N/A)  Patient Location: PACU  Anesthesia Type:General  Level of Consciousness: awake  Airway and Oxygen Therapy: Patient Spontanous Breathing  Post-op Pain: mild  Post-op Assessment: Post-op Vital signs reviewed              Post-op Vital Signs: Reviewed  Last Vitals:  Filed Vitals:   11/22/14 1700  BP:   Pulse: 75  Temp:   Resp: 16    Complications: No apparent anesthesia complications

## 2014-11-22 NOTE — Anesthesia Procedure Notes (Signed)
Procedure Name: Intubation Date/Time: 11/22/2014 2:43 PM Performed by: Ignacia Bayley Pre-anesthesia Checklist: Patient identified, Emergency Drugs available, Suction available and Patient being monitored Patient Re-evaluated:Patient Re-evaluated prior to inductionOxygen Delivery Method: Circle system utilized Preoxygenation: Pre-oxygenation with 100% oxygen Intubation Type: IV induction Ventilation: Mask ventilation without difficulty Laryngoscope Size: Glidescope Grade View: Grade I Tube type: Oral Tube size: 7.5 mm Number of attempts: 1 Airway Equipment and Method: Video-laryngoscopy and Stylet Placement Confirmation: ETT inserted through vocal cords under direct vision,  positive ETCO2 and breath sounds checked- equal and bilateral Secured at: 23 cm Tube secured with: Tape Dental Injury: Teeth and Oropharynx as per pre-operative assessment

## 2014-11-22 NOTE — Transfer of Care (Signed)
Immediate Anesthesia Transfer of Care Note  Patient: Kurt Sexton  Procedure(s) Performed: Procedure(s): Cervical Six Corpectomy with graft and plating Cervical Five-Seven (N/A)  Patient Location: PACU  Anesthesia Type:General  Level of Consciousness: sedated  Airway & Oxygen Therapy: Patient Spontanous Breathing and Patient connected to nasal cannula oxygen  Post-op Assessment: Report given to RN and Post -op Vital signs reviewed and stable  Post vital signs: stable  Last Vitals:  Filed Vitals:   11/22/14 1214  BP: 176/73  Pulse: 63  Temp: 36.4 C  Resp: 20    Complications: No apparent anesthesia complications
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
## 2014-11-22 NOTE — Progress Notes (Signed)
Patient ID: Kurt Sexton, male   DOB: 01/21/1941, 74 y.o.   MRN: [id number]  Awakens to voice & attends, but sleepy.  Improved strength BUE, improved hand intrinsics, right remains weaker d/t muscle atrophy.  Good strength BLE. Vista collar in place. JP patent. Honeycomb drsg intact over incision. No erythema, swelling, or drainage.   Verdis Prime RN BSN

## 2014-11-22 NOTE — Progress Notes (Signed)
Pt arrived to unit alert and oriented. Oriented to room. Questions answered. VS obtained. Call bell at side. Bed alarm activated. Will continue to monitor. Bobbye Charleston, RN

## 2014-11-22 NOTE — Interval H&P Note (Signed)
History and Physical Interval Note:  11/22/2014 11:04 AM  Kurt Sexton  has presented today for surgery, with the diagnosis of HNP C5-6/6-7  The various methods of treatment have been discussed with the patient and family. After consideration of risks, benefits and other options for treatment, the patient has consented to  Procedure(s): ANTERIOR CERVICAL DECOMPRESSION/DISCECTOMY FUSION  C5-6/6-7; Possible C6 Corpectomy (N/A) as a surgical intervention .  The patient's history has been reviewed, patient examined, no change in status, stable for surgery.  I have reviewed the patient's chart and labs.  Questions were answered to the patient's satisfaction.     Mikaylee Arseneau D

## 2014-11-22 NOTE — Op Note (Signed)
11/22/2014  4:21 PM  PATIENT:  Kurt Sexton  74 y.o. 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   POST-OPERATIVE DIAGNOSIS: Same  PROCEDURE:  Procedure(s): ANTERIOR CERVICAL C6 Corpectomy with PEEK corpectomy graft, autograft and anterior cervical plate (N/A)  SURGEON:  Surgeon(s) and Role:    * Erline Levine, MD - Primary    * Karie Chimera, MD - Assisting  PHYSICIAN ASSISTANT:   ASSISTANTS: Poteat, RN   ANESTHESIA:   general  EBL:  Total I/O In: 1000 [I.V.:1000] Out: 150 [Blood:150]  BLOOD ADMINISTERED:none  DRAINS: (10) Jackson-Pratt drain(s) with closed bulb suction in the prevertebral space   LOCAL MEDICATIONS USED:  LIDOCAINE   SPECIMEN:  No Specimen  DISPOSITION OF SPECIMEN:  N/A  COUNTS:  YES  TOURNIQUET:  * No tourniquets in log *  DICTATION: DICTATION: Patient is 74 year old male with severe cervical myelopathy and weakness with HNP, spondylosis, stenosis, radiculopathy C5/6 and C6/7.  Because of the large size of the disc herniation, which extends behind the entirety of the C 6 vertebral body, it was elected to perform a corpectomy of C 6.  PROCEDURE: Patient was brought to operating room and following the smooth and uncomplicated induction of general endotracheal anesthesia his head was placed on a horseshoe head holder he was placed in 5 pounds of Holter traction and her anterior neck was prepped and draped in usual sterile fashion. An incision was made on the left side of midline after infiltrating the skin and subcutaneous tissues with local lidocaine. The platysmal layer was incised and subplatysmal dissection was performed exposing the anterior border sternocleidomastoid muscle. Using blunt dissection the carotid sheath was kept lateral and trachea and esophagus kept medial exposing the anterior cervical spine. A bent spinal needle was placed it was felt to be the C5/6 and C 67 levels and this  was confirmed on intraoperative x-ray. Longus coli muscles were taken down from the anterior cervical spine using electrocautery and key elevator and self-retaining retractor was placed exposing the C5/6 and C6/7 levels. The interspaces were incised and a thorough discectomy was performed. Distraction pins were placed at C 5 and C 7. A corpectomy of C 6 was performed with high speed drill and bone was saved for later grafting.   The spinal cord dura and both C6 and C 7 nerve roots were widely decompressed. The large disc herniation and posterior ligament was removed and the spinal cord dura was widely decompressed.  Hemostasis was assured. After trial sizing a 23 mm peek corpectomy cage was selected and packed with autograft. This was tamped into position and countersunk appropriately. C-arm was used to confirm positioning of implant.  Distraction weight was removed. A 36 mm Nuvasive Archon anterior cervical plate was affixed to the cervical spine with 13 mm fixed-angle screws 2 at C5, and 2 at C7. All screws were well-positioned and locking mechanisms were engaged. A final X ray was obtained which showed well positioned grafts and anterior plate without complicating features. Soft tissues were inspected and found to be in good repair. The wound was irrigated.  A # 10 JP drain was placed.   The platysma layer was closed with 3-0 Vicryl stitches and the skin was reapproximated with 3-0 Vicryl subcuticular stitches. The wound was dressed with Dermabond. Counts were correct at the end of the case. Patient was extubated and taken to recovery in stable and satisfactory condition.    PLAN OF  CARE: Admit to inpatient   PATIENT DISPOSITION:  PACU - hemodynamically stable.   Delay start of Pharmacological VTE agent (>24hrs) due to surgical blood loss or risk of bleeding: yes

## 2014-11-23 MED ORDER — PANTOPRAZOLE SODIUM 40 MG PO TBEC
40.0000 mg | DELAYED_RELEASE_TABLET | Freq: Every day | ORAL | Status: DC
  Administered 2014-11-23 – 2014-11-25 (×3): 40 mg via ORAL
  Filled 2014-11-23 (×3): qty 1

## 2014-11-23 NOTE — Progress Notes (Signed)
Patient ID: Kurt Sexton, male   DOB: 1940-09-14, 73 y.o.   MRN: [id number] Afeb, vss No new neuro issues Feels his hands are better than pre op. Wound clean and dry. Will increase activity today, hopefully home tomorrow.
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
## 2014-11-24 NOTE — Progress Notes (Signed)
Utilization Review Completed.Ronia Hazelett T9/06/2014  
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
## 2014-11-24 NOTE — Progress Notes (Signed)
Patient ID: Kurt Sexton, male   DOB: [birth year], 74 y.o.   MRN: [id number] Afeb, vss No new neuro issues UIncreassing activity. Feels he will be ready for d/c tomorrow.

## 2014-11-25 MED ORDER — OXYCODONE-ACETAMINOPHEN 5-325 MG PO TABS: 1.0000 | ORAL_TABLET | ORAL | Status: DC | PRN

## 2014-11-25 MED ORDER — METHOCARBAMOL 500 MG PO TABS: 750.0000 mg | ORAL_TABLET | Freq: Four times a day (QID) | ORAL | Status: DC | PRN

## 2014-11-25 NOTE — Progress Notes (Signed)
No acute events AVSS Awake and alert Moving all extremities well Incision c/d/i D/c

## 2014-11-25 NOTE — Progress Notes (Signed)
Patient being discharged home orders for home health services also a 3-1 BSC and walker. IV removed discharge summary reviewed patient alert and oriented pain is under control.

## 2014-11-25 NOTE — Care Management Important Message (Signed)
Important Message  Patient Details  Name: MARVON SHILLINGBURG MRN: [id number] Date of Birth: 10-Sep-1940   Medicare Important Message Given:  Yes-second notification given    Loann Quill 11/25/2014, 8:58 AM
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
## 2014-11-25 NOTE — Care Management Note (Signed)
Case Management Note  Patient Details  Name: Kurt Sexton MRN: [id number] Date of Birth: 09-Jun-1940  Subjective/Objective:                 Patient from home with wife. Will DC today with [facility] OT by [facility] . Referral called in to [place]. Will have 3in1, and rolling walker brought to room prior to discharge. Called in referral to [facility] with [facility].    Action/Plan:   Expected Discharge Date:                  Expected Discharge Plan:  [place]  In-House Referral:     Discharge planning Services  CM Consult  Post Acute Care Choice:  Durable Medical Equipment, Home Health Choice offered to:  Patient  DME Arranged:  3-N-1, Walker rolling with seat DME Agency:  [place]:  PT, OT [place] Agency:  [place]  Status of Service:  Completed, signed off  Medicare Important Message Given:  Yes-second notification given Date Medicare IM Given:    Medicare IM give by:    Date Additional Medicare IM Given:    Additional Medicare Important Message give by:     If discussed at [place] of Stay Meetings, dates discussed:    Additional Comments:  Carles Collet, RN 11/25/2014, 11:04 AM
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
## 2014-11-25 NOTE — Discharge Summary (Signed)
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 He tolerated this well and was stable on discharge from the operating room to the PACU and then to the floor. He had an uneventful postoperative course. He was gradually able to increase his ambulation. His pain is now controlled with only oral pain medicine. He is discharged home at this time in stable medical neurological condition.  Discharge medications: Percocet for pain, Robaxin for muscle spasms. He'll resume the remainder of her prior medications.  Follow-up: 2 weeks

## 2014-11-26 ENCOUNTER — Telehealth: Payer: Self-pay | Admitting: Neurology

## 2014-11-26 ENCOUNTER — Encounter (HOSPITAL_COMMUNITY): Payer: Self-pay | Admitting: Neurosurgery

## 2014-11-26 ENCOUNTER — Encounter: Payer: Medicare Other | Admitting: Neurology

## 2014-11-26 ENCOUNTER — Telehealth: Payer: Self-pay | Admitting: Internal Medicine

## 2014-11-26 DIAGNOSIS — M4802 Spinal stenosis, cervical region: Secondary | ICD-10-CM | POA: Diagnosis not present

## 2014-11-26 DIAGNOSIS — Z4789 Encounter for other orthopedic aftercare: Secondary | ICD-10-CM | POA: Diagnosis not present

## 2014-11-26 NOTE — Telephone Encounter (Signed)
Patient is wanting to make sure that it is still ok to take furosemide (LASIX) 40 MG tablet [[id number]] since his neck surgery.

## 2014-11-26 NOTE — Telephone Encounter (Signed)
Called Kathlee Nations back and informed her that patient did have neck surgery so we will need to wait to call him about PT.  She will call him next week.

## 2014-11-26 NOTE — Telephone Encounter (Signed)
[name]/ from: Physical & Hand Theropy/ needs a call back @ [phone] concerning/ PT: Kurt Sexton

## 2014-11-27 ENCOUNTER — Encounter (HOSPITAL_COMMUNITY): Payer: Self-pay | Admitting: Neurosurgery

## 2014-11-27 NOTE — Telephone Encounter (Signed)
Take Furosemide as before. Skip furosenide on the day of surgery Thx

## 2014-11-27 NOTE — Telephone Encounter (Signed)
Notified pt wife (patricia) with md response...Johny Chess

## 2014-11-27 NOTE — Telephone Encounter (Signed)
Kurt Sexton is calling to follow up to find out on this question. She states that this is an emergent situation because of the extreme strain it takes for him to urinate

## 2014-11-28 DIAGNOSIS — Z4789 Encounter for other orthopedic aftercare: Secondary | ICD-10-CM | POA: Diagnosis not present

## 2014-11-28 DIAGNOSIS — M4802 Spinal stenosis, cervical region: Secondary | ICD-10-CM | POA: Diagnosis not present

## 2014-11-29 DIAGNOSIS — Z4789 Encounter for other orthopedic aftercare: Secondary | ICD-10-CM | POA: Diagnosis not present

## 2014-11-29 DIAGNOSIS — M4802 Spinal stenosis, cervical region: Secondary | ICD-10-CM | POA: Diagnosis not present

## 2014-12-02 DIAGNOSIS — M4802 Spinal stenosis, cervical region: Secondary | ICD-10-CM | POA: Diagnosis not present

## 2014-12-02 DIAGNOSIS — Z4789 Encounter for other orthopedic aftercare: Secondary | ICD-10-CM | POA: Diagnosis not present

## 2014-12-03 DIAGNOSIS — Z4789 Encounter for other orthopedic aftercare: Secondary | ICD-10-CM | POA: Diagnosis not present

## 2014-12-03 DIAGNOSIS — M4802 Spinal stenosis, cervical region: Secondary | ICD-10-CM | POA: Diagnosis not present

## 2014-12-04 ENCOUNTER — Ambulatory Visit: Payer: Medicare Other | Admitting: Neurology

## 2014-12-04 DIAGNOSIS — Z4789 Encounter for other orthopedic aftercare: Secondary | ICD-10-CM | POA: Diagnosis not present

## 2014-12-04 DIAGNOSIS — M4802 Spinal stenosis, cervical region: Secondary | ICD-10-CM | POA: Diagnosis not present

## 2014-12-05 DIAGNOSIS — Z4789 Encounter for other orthopedic aftercare: Secondary | ICD-10-CM | POA: Diagnosis not present

## 2014-12-05 DIAGNOSIS — M4802 Spinal stenosis, cervical region: Secondary | ICD-10-CM | POA: Diagnosis not present

## 2014-12-06 DIAGNOSIS — M4802 Spinal stenosis, cervical region: Secondary | ICD-10-CM | POA: Diagnosis not present

## 2014-12-06 DIAGNOSIS — Z4789 Encounter for other orthopedic aftercare: Secondary | ICD-10-CM | POA: Diagnosis not present

## 2014-12-09 ENCOUNTER — Ambulatory Visit: Payer: Medicare Other | Admitting: Neurology

## 2014-12-09 DIAGNOSIS — M5412 Radiculopathy, cervical region: Secondary | ICD-10-CM | POA: Diagnosis not present

## 2014-12-10 DIAGNOSIS — M4802 Spinal stenosis, cervical region: Secondary | ICD-10-CM | POA: Diagnosis not present

## 2014-12-10 DIAGNOSIS — Z4789 Encounter for other orthopedic aftercare: Secondary | ICD-10-CM | POA: Diagnosis not present

## 2014-12-11 DIAGNOSIS — M4802 Spinal stenosis, cervical region: Secondary | ICD-10-CM | POA: Diagnosis not present

## 2014-12-11 DIAGNOSIS — Z4789 Encounter for other orthopedic aftercare: Secondary | ICD-10-CM | POA: Diagnosis not present

## 2014-12-13 ENCOUNTER — Ambulatory Visit (INDEPENDENT_AMBULATORY_CARE_PROVIDER_SITE_OTHER): Payer: Medicare Other | Admitting: Neurology

## 2014-12-13 ENCOUNTER — Encounter: Payer: Self-pay | Admitting: Neurology

## 2014-12-13 VITALS — BP 130/64 | HR 96 | Ht 76.5 in | Wt 230.2 lb

## 2014-12-13 DIAGNOSIS — M792 Neuralgia and neuritis, unspecified: Secondary | ICD-10-CM

## 2014-12-13 DIAGNOSIS — M5000 Cervical disc disorder with myelopathy, unspecified cervical region: Secondary | ICD-10-CM | POA: Insufficient documentation

## 2014-12-13 DIAGNOSIS — Z4789 Encounter for other orthopedic aftercare: Secondary | ICD-10-CM | POA: Diagnosis not present

## 2014-12-13 DIAGNOSIS — M4802 Spinal stenosis, cervical region: Secondary | ICD-10-CM | POA: Diagnosis not present

## 2014-12-13 MED ORDER — GABAPENTIN 300 MG PO CAPS: 300.0000 mg | ORAL_CAPSULE | Freq: Every day | ORAL | Status: DC
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
## 2014-12-13 NOTE — Patient Instructions (Signed)
1. Start gabapentin 300mg  at bedtime 2. Call with an update in 1 month 3. Continue therapy as per neurosurgery 4. Return to clinic in January

## 2014-12-16 DIAGNOSIS — Z4789 Encounter for other orthopedic aftercare: Secondary | ICD-10-CM | POA: Diagnosis not present

## 2014-12-16 DIAGNOSIS — M4802 Spinal stenosis, cervical region: Secondary | ICD-10-CM | POA: Diagnosis not present

## 2014-12-17 DIAGNOSIS — M4802 Spinal stenosis, cervical region: Secondary | ICD-10-CM | POA: Diagnosis not present

## 2014-12-17 DIAGNOSIS — Z4789 Encounter for other orthopedic aftercare: Secondary | ICD-10-CM | POA: Diagnosis not present

## 2014-12-19 DIAGNOSIS — Z4789 Encounter for other orthopedic aftercare: Secondary | ICD-10-CM | POA: Diagnosis not present

## 2014-12-19 DIAGNOSIS — M4802 Spinal stenosis, cervical region: Secondary | ICD-10-CM | POA: Diagnosis not present

## 2014-12-20 DIAGNOSIS — Z4789 Encounter for other orthopedic aftercare: Secondary | ICD-10-CM | POA: Diagnosis not present

## 2014-12-20 DIAGNOSIS — M4802 Spinal stenosis, cervical region: Secondary | ICD-10-CM | POA: Diagnosis not present

## 2014-12-23 ENCOUNTER — Encounter: Payer: Self-pay | Admitting: Radiation Oncology

## 2014-12-23 ENCOUNTER — Ambulatory Visit: Payer: Medicare Other | Admitting: Radiation Oncology

## 2014-12-23 NOTE — Progress Notes (Signed)
Chart note: Mr. Kurt Sexton had a cervical corpectomy for cervical spine cord compression with myelopathy.  This was performed by Dr. Vertell Limber on September 2.  He is not yet had placement of his 3 gold markers for image guidance for radiation therapy.  I spoke with him this morning and he tells me that he will need to wait at least another 3-4 weeks before he can move around to have gold markers placed.  We will then schedule his CT simulation.

## 2014-12-24 DIAGNOSIS — M4802 Spinal stenosis, cervical region: Secondary | ICD-10-CM | POA: Diagnosis not present

## 2014-12-24 DIAGNOSIS — Z4789 Encounter for other orthopedic aftercare: Secondary | ICD-10-CM | POA: Diagnosis not present

## 2014-12-26 DIAGNOSIS — Z4789 Encounter for other orthopedic aftercare: Secondary | ICD-10-CM | POA: Diagnosis not present

## 2014-12-26 DIAGNOSIS — M4802 Spinal stenosis, cervical region: Secondary | ICD-10-CM | POA: Diagnosis not present

## 2015-01-02 ENCOUNTER — Ambulatory Visit (INDEPENDENT_AMBULATORY_CARE_PROVIDER_SITE_OTHER): Payer: Medicare Other

## 2015-01-02 DIAGNOSIS — Z23 Encounter for immunization: Secondary | ICD-10-CM

## 2015-01-06 ENCOUNTER — Telehealth: Payer: Self-pay | Admitting: *Deleted

## 2015-01-06 NOTE — Telephone Encounter (Signed)
CALLED PATIENT TO INFORM OF APPT. WITH DR. Pilar Jarvis OF [facility] ARRIVAL TIME - 8 AM , LVM FOR A RETURN CALL

## 2015-01-08 ENCOUNTER — Telehealth: Payer: Self-pay | Admitting: *Deleted

## 2015-01-08 ENCOUNTER — Ambulatory Visit: Payer: Medicare Other | Admitting: Neurology

## 2015-01-08 NOTE — Telephone Encounter (Signed)
PATIENT HAD A CONSULTATION ON 01-20-15 @ 11 AM WITH DR. Pilar Jarvis , PATIENT DECLINED THIS APPT. DUE TO HAVING BACK SURGERY, NOTIFIED DR.

## 2015-01-09 ENCOUNTER — Ambulatory Visit: Payer: Medicare Other | Admitting: Family Medicine

## 2015-01-10 ENCOUNTER — Telehealth: Payer: Self-pay | Admitting: Neurology

## 2015-01-10 NOTE — Telephone Encounter (Signed)
Left message for Mikle Bosworth to call me back.

## 2015-01-10 NOTE — Telephone Encounter (Signed)
[place]/ Physical Therapist/Hand Specialist/[phone]/called about a referral for [employer] [employer]

## 2015-01-13 NOTE — Telephone Encounter (Signed)
Spoke with Mikle Bosworth and she said that patient would also like hand therapy with his neck PT.  Will fax OT referral.

## 2015-01-14 ENCOUNTER — Ambulatory Visit (INDEPENDENT_AMBULATORY_CARE_PROVIDER_SITE_OTHER): Payer: Medicare Other | Admitting: Internal Medicine

## 2015-01-14 ENCOUNTER — Encounter: Payer: Self-pay | Admitting: Internal Medicine

## 2015-01-14 VITALS — BP 139/68 | HR 64 | Wt 235.0 lb

## 2015-01-14 DIAGNOSIS — G959 Disease of spinal cord, unspecified: Secondary | ICD-10-CM

## 2015-01-14 DIAGNOSIS — M5000 Cervical disc disorder with myelopathy, unspecified cervical region: Secondary | ICD-10-CM

## 2015-01-14 DIAGNOSIS — M10041 Idiopathic gout, right hand: Secondary | ICD-10-CM | POA: Diagnosis not present

## 2015-01-14 DIAGNOSIS — L309 Dermatitis, unspecified: Secondary | ICD-10-CM | POA: Diagnosis not present

## 2015-01-14 MED ORDER — TORSEMIDE 100 MG PO TABS: 50.0000 mg | ORAL_TABLET | Freq: Every day | ORAL | Status: DC

## 2015-01-14 NOTE — Assessment & Plan Note (Signed)
Doing ok.

## 2015-01-14 NOTE — Assessment & Plan Note (Signed)
Stop Furosemide. Start Demadex (torsemide)

## 2015-01-14 NOTE — Assessment & Plan Note (Signed)
Recovering postop 

## 2015-01-14 NOTE — Assessment & Plan Note (Signed)
Dr Ubaldo Glassing recommended a ref to [facility]

## 2015-01-14 NOTE — Progress Notes (Signed)
Subjective:  Patient ID: Kurt Sexton, male    DOB: March 03, 1941  Age: 74 y.o. MRN: [id number]  CC: No chief complaint on file.   HPI Kurt Sexton presents for HTN, OA, BPH. Pt is s/p neck surgery  Outpatient Prescriptions Prior to Visit  Medication Sig Dispense Refill  . aspirin 81 MG EC tablet Take 81 mg by mouth daily.      Marland Kitchen atorvastatin (LIPITOR) 10 MG tablet Take 1 tablet (10 mg total) by mouth daily. 30 tablet 11  . augmented betamethasone dipropionate (DIPROLENE AF) 0.05 % cream Apply topically 2 (two) times daily. 100 g 3  . betamethasone dipropionate (DIPROLENE) 0.05 % cream Apply 1 application topically 2 (two) times daily. Apply to hands, feet, ankles, buttocks and thighs    . cimetidine (TAGAMET) 400 MG tablet Take 1 tablet (400 mg total) by mouth 2 (two) times daily. 60 tablet 5  . furosemide (LASIX) 40 MG tablet Take 1-2 tablets (40-80 mg total) by mouth daily as needed for fluid or edema. 60 tablet 6  . gabapentin (NEURONTIN) 300 MG capsule Take 1 capsule (300 mg total) by mouth at bedtime. 30 capsule 5  . HYDROcodone-acetaminophen (NORCO) 5-325 MG per tablet Take 1 tablet by mouth every 4 (four) hours as needed. (Patient taking differently: Take 1 tablet by mouth every 4 (four) hours as needed for severe pain. ) 15 tablet 0  . hydrOXYzine (ATARAX/VISTARIL) 25 MG tablet Take 1 tablet (25 mg total) by mouth every 8 (eight) hours as needed for itching or nausea. (Patient taking differently: Take 25 mg by mouth every 8 (eight) hours as needed for itching. ) 60 tablet 1  . labetalol (NORMODYNE) 200 MG tablet Take 1 tablet (200 mg total) by mouth 3 (three) times daily. 180 tablet 3  . meclizine (ANTIVERT) 12.5 MG tablet Take 1 tablet (12.5 mg total) by mouth 3 (three) times daily as needed for dizziness. 60 tablet 1  . methocarbamol (ROBAXIN) 500 MG tablet Take 1.5 tablets (750 mg total) by mouth every 6 (six) hours as needed for muscle spasms. 180 tablet 1  .  oxyCODONE-acetaminophen (PERCOCET/ROXICET) 5-325 MG per tablet Take 1-2 tablets by mouth every 4 (four) hours as needed for moderate pain. 90 tablet 0  . triamcinolone ointment (KENALOG) 0.1 % Apply 1 application topically 2 (two) times daily as needed (skin irritation).      No facility-administered medications prior to visit.    ROS Review of Systems  Constitutional: Negative for appetite change, fatigue and unexpected weight change.  HENT: Negative for congestion, nosebleeds, sneezing, sore throat and trouble swallowing.   Eyes: Negative for itching and visual disturbance.  Respiratory: Negative for cough.   Cardiovascular: Negative for chest pain, palpitations and leg swelling.  Gastrointestinal: Negative for nausea, diarrhea, blood in stool and abdominal distention.  Genitourinary: Negative for frequency and hematuria.  Musculoskeletal: Positive for back pain, gait problem, neck pain and neck stiffness. Negative for joint swelling.  Skin: Negative for rash.  Neurological: Negative for dizziness, tremors, speech difficulty and weakness.  Psychiatric/Behavioral: Negative for sleep disturbance, dysphoric mood and agitation. The patient is not nervous/anxious.     Objective:  BP 140/68 mmHg  Pulse 64  Wt 235 lb (106.595 kg)  SpO2 97%  BP Readings from Last 3 Encounters:  01/14/15 140/68  12/13/14 130/64  11/25/14 184/77    Wt Readings from Last 3 Encounters:  01/14/15 235 lb (106.595 kg)  12/13/14 230 lb 4 oz (104.441 kg)  11/22/14 230 lb (104.327 kg)    Physical Exam  Constitutional: He is oriented to person, place, and time. He appears well-developed. No distress.  NAD  HENT:  Mouth/Throat: Oropharynx is clear and moist.  Eyes: Conjunctivae are normal. Pupils are equal, round, and reactive to light.  Neck: Normal range of motion. No JVD present. No thyromegaly present.  Cardiovascular: Normal rate, regular rhythm, normal heart sounds and intact distal pulses.  Exam  reveals no gallop and no friction rub.   No murmur heard. Pulmonary/Chest: Effort normal and breath sounds normal. No respiratory distress. He has no wheezes. He has no rales. He exhibits no tenderness.  Abdominal: Soft. Bowel sounds are normal. He exhibits no distension and no mass. There is no tenderness. There is no rebound and no guarding.  Musculoskeletal: Normal range of motion. He exhibits tenderness. He exhibits no edema.  Lymphadenopathy:    He has no cervical adenopathy.  Neurological: He is alert and oriented to person, place, and time. He has normal reflexes. No cranial nerve deficit. He exhibits normal muscle tone. He displays a negative Romberg sign. Coordination abnormal. Gait normal.  Skin: Skin is warm and dry. No rash noted.  Psychiatric: He has a normal mood and affect. His behavior is normal. Judgment and thought content normal.  Neck brace Cane LE edema 2+ B R>L  Lab Results  Component Value Date   WBC 7.0 11/22/2014   HGB 9.9* 11/22/2014   HCT 31.8* 11/22/2014   PLT 277 11/22/2014   GLUCOSE 106* 11/22/2014   CHOL 208* 12/05/2013   TRIG 111.0 12/05/2013   HDL 35.30* 12/05/2013   LDLCALC 151* 12/05/2013   ALT 36 07/10/2014   AST 26 07/10/2014   NA 139 11/22/2014   K 4.6 11/22/2014   CL 107 11/22/2014   CREATININE 1.12 11/22/2014   BUN 11 11/22/2014   CO2 26 11/22/2014   TSH 3.17 07/10/2014   PSA 13.26* 07/10/2014   INR 1.01 10/25/2010   HGBA1C 5.2 03/27/2014    Dg Cervical Spine 1 View  11/22/2014  CLINICAL DATA:  Patient status post C5-C7 corpectomy. EXAM: DG C-ARM 61-120 MIN; DG CERVICAL SPINE - 1 VIEW COMPARISON:  MRI C-spine 11/21/2014 FINDINGS: Anterior cervical spinal fusion hardware demonstrated C5-C7. Intervertebral body spacers are present. Single fluoroscopic intraoperative image presented for evaluation. IMPRESSION: Intraoperative fluoroscopic image for cervical spine fusion. Electronically Signed   By: Lovey Newcomer M.D.   On: 11/22/2014 16:36    Dg C-arm 1-60 Min  11/22/2014  CLINICAL DATA:  Patient status post C5-C7 corpectomy. EXAM: DG C-ARM 61-120 MIN; DG CERVICAL SPINE - 1 VIEW COMPARISON:  MRI C-spine 11/21/2014 FINDINGS: Anterior cervical spinal fusion hardware demonstrated C5-C7. Intervertebral body spacers are present. Single fluoroscopic intraoperative image presented for evaluation. IMPRESSION: Intraoperative fluoroscopic image for cervical spine fusion. Electronically Signed   By: Lovey Newcomer M.D.   On: 11/22/2014 16:36    Assessment & Plan:   Diagnoses and all orders for this visit:  Cervical myelopathy ([place])  Eczematous dermatitis -     Ambulatory referral to Dermatology  HNP (herniated nucleus pulposus) with myelopathy, cervical   I am having Mr. Brethauer maintain his aspirin, labetalol, atorvastatin, HYDROcodone-acetaminophen, augmented betamethasone dipropionate, cimetidine, hydrOXYzine, meclizine, furosemide, betamethasone dipropionate, triamcinolone ointment, methocarbamol, oxyCODONE-acetaminophen, and gabapentin.  No orders of the defined types were placed in this encounter.     Follow-up: No Follow-up on file.  [name], MD

## 2015-01-14 NOTE — Patient Instructions (Signed)
Stop Furosemide. Start Demadex (torsemide)

## 2015-01-14 NOTE — Assessment & Plan Note (Signed)
Chronic OA 

## 2015-01-14 NOTE — Assessment & Plan Note (Signed)
Better: recovering from surgery

## 2015-01-14 NOTE — Progress Notes (Signed)
Pre visit review using our clinic review tool, if applicable. No additional management support is needed unless otherwise documented below in the visit note. 

## 2015-01-15 DIAGNOSIS — G959 Disease of spinal cord, unspecified: Secondary | ICD-10-CM | POA: Diagnosis not present

## 2015-01-15 DIAGNOSIS — Z6828 Body mass index (BMI) 28.0-28.9, adult: Secondary | ICD-10-CM | POA: Diagnosis not present

## 2015-01-17 ENCOUNTER — Ambulatory Visit: Payer: Medicare Other | Admitting: Internal Medicine

## 2015-01-21 DIAGNOSIS — M79641 Pain in right hand: Secondary | ICD-10-CM | POA: Diagnosis not present

## 2015-01-21 DIAGNOSIS — M6281 Muscle weakness (generalized): Secondary | ICD-10-CM | POA: Diagnosis not present

## 2015-01-21 DIAGNOSIS — M25441 Effusion, right hand: Secondary | ICD-10-CM | POA: Diagnosis not present

## 2015-01-23 DIAGNOSIS — M6281 Muscle weakness (generalized): Secondary | ICD-10-CM | POA: Diagnosis not present

## 2015-01-23 DIAGNOSIS — M25441 Effusion, right hand: Secondary | ICD-10-CM | POA: Diagnosis not present

## 2015-01-23 DIAGNOSIS — M79641 Pain in right hand: Secondary | ICD-10-CM | POA: Diagnosis not present

## 2015-01-28 DIAGNOSIS — M25441 Effusion, right hand: Secondary | ICD-10-CM | POA: Diagnosis not present

## 2015-01-28 DIAGNOSIS — M6281 Muscle weakness (generalized): Secondary | ICD-10-CM | POA: Diagnosis not present

## 2015-01-28 DIAGNOSIS — M79641 Pain in right hand: Secondary | ICD-10-CM | POA: Diagnosis not present

## 2015-01-30 ENCOUNTER — Other Ambulatory Visit: Payer: Self-pay | Admitting: *Deleted

## 2015-01-30 DIAGNOSIS — M6281 Muscle weakness (generalized): Secondary | ICD-10-CM | POA: Diagnosis not present

## 2015-01-30 DIAGNOSIS — M25441 Effusion, right hand: Secondary | ICD-10-CM | POA: Diagnosis not present

## 2015-01-30 DIAGNOSIS — M79641 Pain in right hand: Secondary | ICD-10-CM | POA: Diagnosis not present

## 2015-01-30 MED ORDER — LABETALOL HCL 200 MG PO TABS: 200.0000 mg | ORAL_TABLET | Freq: Three times a day (TID) | ORAL | Status: DC

## 2015-02-04 DIAGNOSIS — M79641 Pain in right hand: Secondary | ICD-10-CM | POA: Diagnosis not present

## 2015-02-04 DIAGNOSIS — M25441 Effusion, right hand: Secondary | ICD-10-CM | POA: Diagnosis not present

## 2015-02-04 DIAGNOSIS — M6281 Muscle weakness (generalized): Secondary | ICD-10-CM | POA: Diagnosis not present

## 2015-02-06 DIAGNOSIS — M25441 Effusion, right hand: Secondary | ICD-10-CM | POA: Diagnosis not present

## 2015-02-06 DIAGNOSIS — M79641 Pain in right hand: Secondary | ICD-10-CM | POA: Diagnosis not present

## 2015-02-06 DIAGNOSIS — M6281 Muscle weakness (generalized): Secondary | ICD-10-CM | POA: Diagnosis not present

## 2015-02-10 DIAGNOSIS — M6281 Muscle weakness (generalized): Secondary | ICD-10-CM | POA: Diagnosis not present

## 2015-02-10 DIAGNOSIS — M25441 Effusion, right hand: Secondary | ICD-10-CM | POA: Diagnosis not present

## 2015-02-10 DIAGNOSIS — M79641 Pain in right hand: Secondary | ICD-10-CM | POA: Diagnosis not present

## 2015-02-12 DIAGNOSIS — M6281 Muscle weakness (generalized): Secondary | ICD-10-CM | POA: Diagnosis not present

## 2015-02-12 DIAGNOSIS — Z6829 Body mass index (BMI) 29.0-29.9, adult: Secondary | ICD-10-CM | POA: Diagnosis not present

## 2015-02-12 DIAGNOSIS — M25441 Effusion, right hand: Secondary | ICD-10-CM | POA: Diagnosis not present

## 2015-02-12 DIAGNOSIS — M4802 Spinal stenosis, cervical region: Secondary | ICD-10-CM | POA: Diagnosis not present

## 2015-02-12 DIAGNOSIS — M79641 Pain in right hand: Secondary | ICD-10-CM | POA: Diagnosis not present

## 2015-02-12 DIAGNOSIS — M5412 Radiculopathy, cervical region: Secondary | ICD-10-CM | POA: Diagnosis not present

## 2015-02-18 ENCOUNTER — Emergency Department (HOSPITAL_COMMUNITY): Payer: Medicare Other

## 2015-02-18 ENCOUNTER — Encounter (HOSPITAL_COMMUNITY): Payer: Self-pay | Admitting: Emergency Medicine

## 2015-02-18 ENCOUNTER — Observation Stay (HOSPITAL_COMMUNITY)
Admission: EM | Admit: 2015-02-18 | Discharge: 2015-02-20 | Disposition: A | Discharge status: Skilled Nursing Facility | Payer: Medicare Other | Attending: Internal Medicine | Admitting: Internal Medicine

## 2015-02-18 ENCOUNTER — Emergency Department (HOSPITAL_BASED_OUTPATIENT_CLINIC_OR_DEPARTMENT_OTHER): Payer: Medicare Other

## 2015-02-18 DIAGNOSIS — I10 Essential (primary) hypertension: Secondary | ICD-10-CM | POA: Insufficient documentation

## 2015-02-18 DIAGNOSIS — M79604 Pain in right leg: Secondary | ICD-10-CM | POA: Diagnosis not present

## 2015-02-18 DIAGNOSIS — L570 Actinic keratosis: Secondary | ICD-10-CM | POA: Diagnosis not present

## 2015-02-18 DIAGNOSIS — R531 Weakness: Secondary | ICD-10-CM | POA: Diagnosis not present

## 2015-02-18 DIAGNOSIS — R600 Localized edema: Secondary | ICD-10-CM | POA: Insufficient documentation

## 2015-02-18 DIAGNOSIS — I872 Venous insufficiency (chronic) (peripheral): Secondary | ICD-10-CM | POA: Diagnosis not present

## 2015-02-18 DIAGNOSIS — E7800 Pure hypercholesterolemia, unspecified: Secondary | ICD-10-CM | POA: Insufficient documentation

## 2015-02-18 DIAGNOSIS — R609 Edema, unspecified: Secondary | ICD-10-CM | POA: Diagnosis present

## 2015-02-18 DIAGNOSIS — M79606 Pain in leg, unspecified: Secondary | ICD-10-CM | POA: Diagnosis not present

## 2015-02-18 DIAGNOSIS — Z86718 Personal history of other venous thrombosis and embolism: Secondary | ICD-10-CM | POA: Diagnosis not present

## 2015-02-18 DIAGNOSIS — Z8546 Personal history of malignant neoplasm of prostate: Secondary | ICD-10-CM | POA: Insufficient documentation

## 2015-02-18 DIAGNOSIS — M109 Gout, unspecified: Secondary | ICD-10-CM | POA: Insufficient documentation

## 2015-02-18 DIAGNOSIS — M2042 Other hammer toe(s) (acquired), left foot: Secondary | ICD-10-CM | POA: Insufficient documentation

## 2015-02-18 DIAGNOSIS — E559 Vitamin D deficiency, unspecified: Secondary | ICD-10-CM | POA: Diagnosis not present

## 2015-02-18 DIAGNOSIS — M2011 Hallux valgus (acquired), right foot: Secondary | ICD-10-CM | POA: Insufficient documentation

## 2015-02-18 DIAGNOSIS — M7989 Other specified soft tissue disorders: Secondary | ICD-10-CM

## 2015-02-18 DIAGNOSIS — G8929 Other chronic pain: Secondary | ICD-10-CM | POA: Diagnosis not present

## 2015-02-18 DIAGNOSIS — M25562 Pain in left knee: Secondary | ICD-10-CM | POA: Diagnosis not present

## 2015-02-18 DIAGNOSIS — M1711 Unilateral primary osteoarthritis, right knee: Secondary | ICD-10-CM | POA: Insufficient documentation

## 2015-02-18 DIAGNOSIS — Z8673 Personal history of transient ischemic attack (TIA), and cerebral infarction without residual deficits: Secondary | ICD-10-CM | POA: Insufficient documentation

## 2015-02-18 DIAGNOSIS — Z881 Allergy status to other antibiotic agents status: Secondary | ICD-10-CM | POA: Diagnosis not present

## 2015-02-18 DIAGNOSIS — M545 Low back pain: Secondary | ICD-10-CM | POA: Insufficient documentation

## 2015-02-18 DIAGNOSIS — M79605 Pain in left leg: Secondary | ICD-10-CM

## 2015-02-18 DIAGNOSIS — M25461 Effusion, right knee: Secondary | ICD-10-CM | POA: Diagnosis not present

## 2015-02-18 DIAGNOSIS — E785 Hyperlipidemia, unspecified: Secondary | ICD-10-CM | POA: Diagnosis present

## 2015-02-18 DIAGNOSIS — D649 Anemia, unspecified: Secondary | ICD-10-CM | POA: Diagnosis not present

## 2015-02-18 DIAGNOSIS — Z888 Allergy status to other drugs, medicaments and biological substances status: Secondary | ICD-10-CM | POA: Diagnosis not present

## 2015-02-18 DIAGNOSIS — L818 Other specified disorders of pigmentation: Secondary | ICD-10-CM | POA: Diagnosis not present

## 2015-02-18 DIAGNOSIS — I82409 Acute embolism and thrombosis of unspecified deep veins of unspecified lower extremity: Secondary | ICD-10-CM | POA: Diagnosis present

## 2015-02-18 DIAGNOSIS — N400 Enlarged prostate without lower urinary tract symptoms: Secondary | ICD-10-CM | POA: Insufficient documentation

## 2015-02-18 DIAGNOSIS — M1990 Unspecified osteoarthritis, unspecified site: Secondary | ICD-10-CM | POA: Diagnosis present

## 2015-02-18 LAB — URINALYSIS, ROUTINE W REFLEX MICROSCOPIC
Bilirubin Urine: NEGATIVE
Glucose, UA: NEGATIVE mg/dL
Hgb urine dipstick: NEGATIVE
Ketones, ur: NEGATIVE mg/dL
LEUKOCYTES UA: NEGATIVE
NITRITE: NEGATIVE
PROTEIN: NEGATIVE mg/dL
SPECIFIC GRAVITY, URINE: 1.023 (ref 1.005–1.030)
pH: 6 (ref 5.0–8.0)

## 2015-02-18 LAB — BASIC METABOLIC PANEL
ANION GAP: 6 (ref 5–15)
BUN: 16 mg/dL (ref 6–20)
CALCIUM: 8.7 mg/dL — AB (ref 8.9–10.3)
CO2: 27 mmol/L (ref 22–32)
CREATININE: 1.29 mg/dL — AB (ref 0.61–1.24)
Chloride: 104 mmol/L (ref 101–111)
GFR, EST NON AFRICAN AMERICAN: 53 mL/min — AB (ref >60)
Glucose, Bld: 114 mg/dL — ABNORMAL HIGH (ref 65–99)
Potassium: 3.8 mmol/L (ref 3.5–5.1)
SODIUM: 137 mmol/L (ref 135–145)

## 2015-02-18 LAB — I-STAT TROPONIN, ED: TROPONIN I, POC: 0.02 ng/mL (ref 0.00–0.08)

## 2015-02-18 LAB — CBC
HCT: 34.5 % — ABNORMAL LOW (ref 39.0–52.0)
HEMOGLOBIN: 10.9 g/dL — AB (ref 13.0–17.0)
MCH: 25.8 pg — AB (ref 26.0–34.0)
MCHC: 31.6 g/dL (ref 30.0–36.0)
MCV: 81.6 fL (ref 78.0–100.0)
PLATELETS: 243 10*3/uL (ref 150–400)
RBC: 4.23 MIL/uL (ref 4.22–5.81)
RDW: 13.9 % (ref 11.5–15.5)
WBC: 8.9 10*3/uL (ref 4.0–10.5)

## 2015-02-18 LAB — BRAIN NATRIURETIC PEPTIDE: B Natriuretic Peptide: 159.2 pg/mL — ABNORMAL HIGH (ref 0.0–100.0)

## 2015-02-18 LAB — CBG MONITORING, ED: GLUCOSE-CAPILLARY: 118 mg/dL — AB (ref 65–99)

## 2015-02-18 MED ORDER — HYDROMORPHONE HCL 1 MG/ML IJ SOLN
0.5000 mg | Freq: Once | INTRAMUSCULAR | Status: AC
  Administered 2015-02-18: 0.5 mg via INTRAVENOUS
  Filled 2015-02-18: qty 1

## 2015-02-18 MED ORDER — HYDROMORPHONE HCL 1 MG/ML IJ SOLN
1.0000 mg | Freq: Once | INTRAMUSCULAR | Status: AC
  Administered 2015-02-19: 1 mg via INTRAVENOUS
  Filled 2015-02-18: qty 1

## 2015-02-18 MED ORDER — ACETAMINOPHEN 325 MG PO TABS
325.0000 mg | ORAL_TABLET | Freq: Once | ORAL | Status: AC
  Administered 2015-02-18: 325 mg via ORAL
  Filled 2015-02-18: qty 1

## 2015-02-18 MED ORDER — SODIUM CHLORIDE 0.9 % IV BOLUS (SEPSIS)
250.0000 mL | Freq: Once | INTRAVENOUS | Status: AC
  Administered 2015-02-18: 250 mL via INTRAVENOUS

## 2015-02-18 MED ORDER — ONDANSETRON HCL 4 MG/2ML IJ SOLN
4.0000 mg | Freq: Once | INTRAMUSCULAR | Status: AC | PRN
  Administered 2015-02-19: 4 mg via INTRAVENOUS
  Filled 2015-02-18: qty 2

## 2015-02-18 MED ORDER — OXYCODONE-ACETAMINOPHEN 5-325 MG PO TABS
1.0000 | ORAL_TABLET | Freq: Once | ORAL | Status: AC
  Administered 2015-02-18: 1 via ORAL
  Filled 2015-02-18: qty 1

## 2015-02-18 MED ORDER — ONDANSETRON HCL 4 MG/2ML IJ SOLN
4.0000 mg | Freq: Once | INTRAMUSCULAR | Status: AC | PRN
  Administered 2015-02-18: 4 mg via INTRAVENOUS
  Filled 2015-02-18 (×2): qty 2

## 2015-02-18 NOTE — ED Notes (Signed)
Provider in room, family in room

## 2015-02-18 NOTE — ED Provider Notes (Signed)
Medical screening examination/treatment/procedure(s) were conducted as a shared visit with non-physician practitioner(s) and myself.  I personally evaluated the patient during the encounter.  74 year old male bilateral lotion with edema worse over the last 5 months but progressively worsening over the last month with severe pain with ambulation. On exam has pain edema to bilateral mid shins with dry cracked bleeding legs. Not able to palpate pulses however is good cap refill. Lungs are clear to also stage bilaterally, heart regular rate and rhythm with no murmurs rubs or gallops. Plan is to get a DVT study and screening labs and disposition appropriately.    EKG Interpretation   Date/Time:  Tuesday February 18 2015 16:52:04 EST Ventricular Rate:  80 PR Interval:  168 QRS Duration: 96 QT Interval:  414 QTC Calculation: 478 R Axis:   -4 Text Interpretation:  Sinus rhythm Posterior infarct, old Confirmed by  [facility] MD, Tong Pieczynski [phone]) on 02/18/2015 11:09:24 PM        Merrily Pew, MD 02/18/15 2313

## 2015-02-18 NOTE — ED Notes (Signed)
PT off the floor CT

## 2015-02-18 NOTE — ED Notes (Signed)
Bed: [id number] Expected date:  Expected time:  Means of arrival:  Comments: EMS- 74yo M, BLE pain/difficulty walking

## 2015-02-18 NOTE — ED Notes (Signed)
Pt in radiology at this time. 

## 2015-02-18 NOTE — ED Provider Notes (Signed)
CSN: [id number]     Arrival date & time 02/18/15  1403 History   First MD Initiated Contact with Patient 02/18/15 1502     Chief Complaint  Patient presents with  . Generalized Body Aches  . Leg Pain     (Consider location/radiation/quality/duration/timing/severity/associated sxs/prior Treatment) HPI   Kurt Sexton is a 74 y.o. male, with a history of hypertension, osteoarthritis, gout, and CVA, presenting to the ED with leg swelling on the right accompanied by pain and seeping of fluid since April. Pt now has swelling in both feet accompanied by pain with the left side starting to swell about 6 weeks after the right one. Pt has been evaluated by a Dermatologist for this issue and given "special soap" and "some kind of lotion." Pt was told by his PCP that he would set something up at [facility] for treatment of this issue, but so far no one from [facility] has called the pt. Pain is sharp or a "squeezing," rates it 10/10, radiates up his legs. Denies history of diabetes or previous infections in his legs. Denies fever/chills, N/V, numbness/tingling/weakness, or any other complaints. Pt has not tried any medicine for pain control. Pt states he has difficulty walking due to pain.    Past Medical History  Diagnosis Date  . HTN (hypertension)   . Osteoarthritis   . BPH (benign prostatic hyperplasia)     Dr [name]  . CVA (cerebral infarction) 1998  . Vitamin D deficiency   . LBP (low back pain) 2010  . Gout   . Stroke ([place]) 1998  . High cholesterol   . Prostate cancer ([place]) 01/2008    gleason 3+4=7, PSA 4.93, vol 34.18 cc   Past Surgical History  Procedure Laterality Date  . Tonsillectomy    . Hernia repair      x2  . Knee arthroscopy w/ pcl and lcl  repair & tendon graft    . Correction hammer toe    . Prostate biopsy  01/2008    gleason 7  . Prostate biopsy  03/27/13    gleason 3+4=7, volume 34.18 cc  . Tonsillectomy    . Anterior cervical decomp/discectomy fusion N/A 11/22/2014   Procedure: Cervical Six Corpectomy with graft and plating Cervical Five-Seven;  Surgeon: Erline Levine, MD;  Location: [facility];  Service: Neurosurgery;  Laterality: N/A;   Family History  Problem Relation Age of Onset  . Diabetes Other     1st degree relative  . Hypertension Other   . Cancer Brother     prostate, tx w/xrt   Social History  Substance Use Topics  . Smoking status: Never Smoker   . Smokeless tobacco: Never Used  . Alcohol Use: No    Review of Systems  Musculoskeletal:       Leg pain and swelling bilaterally.  Skin:       Weeping clear fluid from the skin of both legs.  All other systems reviewed and are negative.     Allergies  Diazepam; Amlodipine besylate; Cozaar; Doxazosin mesylate; Lisinopril; Ramipril; Spironolactone; and Bactrim  Home Medications   Prior to Admission medications   Medication Sig Start Date End Date Taking? Authorizing Provider  aspirin 81 MG EC tablet Take 81 mg by mouth daily.     Yes Historical Provider, MD  atorvastatin (LIPITOR) 10 MG tablet Take 1 tablet (10 mg total) by mouth daily. 03/27/14  Yes [name], MD  betamethasone dipropionate (DIPROLENE) 0.05 % cream Apply 1 application topically  2 (two) times daily. Apply to hands, feet, ankles, buttocks and thighs 11/04/14  Yes Historical Provider, MD  hydrOXYzine (ATARAX/VISTARIL) 25 MG tablet Take 1 tablet (25 mg total) by mouth every 8 (eight) hours as needed for itching or nausea. Patient taking differently: Take 25 mg by mouth every 8 (eight) hours as needed for itching.  07/10/14  Yes [name], MD  labetalol (NORMODYNE) 200 MG tablet Take 1 tablet (200 mg total) by mouth 3 (three) times daily. 01/30/15  Yes [name], MD  methocarbamol (ROBAXIN) 500 MG tablet Take 1.5 tablets (750 mg total) by mouth every 6 (six) hours as needed for muscle spasms. 11/25/14  Yes Kevan Ny Ditty, MD  oxyCODONE-acetaminophen (PERCOCET/ROXICET) 5-325 MG per tablet  Take 1-2 tablets by mouth every 4 (four) hours as needed for moderate pain. 11/25/14  Yes Kevan Ny Ditty, MD  triamcinolone ointment (KENALOG) 0.1 % Apply 1 application topically 2 (two) times daily as needed (skin irritation).  11/04/14  Yes Historical Provider, MD  cimetidine (TAGAMET) 400 MG tablet Take 1 tablet (400 mg total) by mouth 2 (two) times daily. Patient not taking: Reported on 02/18/2015 07/10/14   [name], MD  gabapentin (NEURONTIN) 300 MG capsule Take 1 capsule (300 mg total) by mouth at bedtime. Patient not taking: Reported on 02/18/2015 12/13/14   Alda Berthold, DO  meclizine (ANTIVERT) 12.5 MG tablet Take 1 tablet (12.5 mg total) by mouth 3 (three) times daily as needed for dizziness. Patient not taking: Reported on 02/18/2015 07/24/14   Tyrone Apple Plotnikov V, MD  torsemide (DEMADEX) 100 MG tablet Take 0.5-1 tablets (50-100 mg total) by mouth daily. Patient not taking: Reported on 02/18/2015 01/14/15   [name], MD   BP 148/64 mmHg  Pulse 80  Temp(Src) 98 F (36.7 C) (Oral)  Resp 23  SpO2 94% Physical Exam  Constitutional: He appears well-developed and well-nourished. No distress.  HENT:  Head: Normocephalic and atraumatic.  Eyes: Conjunctivae are normal. Pupils are equal, round, and reactive to light.  Cardiovascular: Normal rate, regular rhythm, normal heart sounds and intact distal pulses.   Pulmonary/Chest: Effort normal and breath sounds normal. No respiratory distress.  Abdominal: Soft. Bowel sounds are normal.  Musculoskeletal: He exhibits no edema or tenderness.  Bilateral swelling in the lower leg and foot, with increased warmth, tenderness, and pain as well. Erythema noted diffusely across lower legs. Decreased range of motion in the ankle due to swelling and pain.  Lymphadenopathy:    He has no cervical adenopathy.  Neurological: He is alert. He has normal reflexes.  No sensory deficits. Strength 5/5 in all extremities.   Skin: Skin is  warm and dry. He is not diaphoretic.  Diffuse erythema and crusting across both lower legs and both feet. No fluid currently noted and no specific lesions noted.  Nursing note and vitals reviewed.   ED Course  Procedures (including critical care time) Labs Review Labs Reviewed  BASIC METABOLIC PANEL - Abnormal; Notable for the following:    Glucose, Bld 114 (*)    Creatinine, Ser 1.29 (*)    Calcium 8.7 (*)    GFR calc non Af Amer 53 (*)    All other components within normal limits  CBC - Abnormal; Notable for the following:    Hemoglobin 10.9 (*)    HCT 34.5 (*)    MCH 25.8 (*)    All other components within normal limits  BRAIN NATRIURETIC PEPTIDE - Abnormal; Notable for the  following:    B Natriuretic Peptide 159.2 (*)    All other components within normal limits  CBG MONITORING, ED - Abnormal; Notable for the following:    Glucose-Capillary 118 (*)    All other components within normal limits  URINALYSIS, ROUTINE W REFLEX MICROSCOPIC (NOT AT [facility])  CBG MONITORING, ED  Randolm Idol, ED    Imaging Review Dg Chest 2 View  02/18/2015  CLINICAL DATA:  Leg edema EXAM: CHEST  2 VIEW COMPARISON:  10/24/2014 FINDINGS: Normal heart size and mediastinal contours. No acute infiltrate or edema. No effusion or pneumothorax. No acute osseous findings. IMPRESSION: No active cardiopulmonary disease. No cardiomegaly or pulmonary edema. Electronically Signed   By: Monte Fantasia M.D.   On: 02/18/2015 18:09   Dg Tibia/fibula Left  02/18/2015  CLINICAL DATA:  Leg pain and swelling. EXAM: LEFT TIBIA AND FIBULA - 2 VIEW COMPARISON:  None. FINDINGS: Diffuse subcutaneous reticulation visualize lower extremity, greatest at the calf. There is no soft tissue gas or opaque foreign body. Knee osteoarthritis with diffuse marginal spurring. There is a moderate suprapatellar joint effusion. High appearance of the patella but preserved patellar shadow. No acute fracture.  No focal bone lesion.  IMPRESSION: 1. Soft tissue swelling without gas or opaque foreign body. No acute osseous finding. 2. Tricompartmental knee osteoarthritis with joint effusion. Electronically Signed   By: Monte Fantasia M.D.   On: 02/18/2015 16:37   Dg Tibia/fibula Right  02/18/2015  CLINICAL DATA:  Leg pain and swelling. Generalized pain and weakness. EXAM: RIGHT TIBIA AND FIBULA - 2 VIEW COMPARISON:  Right foot radiographs same day. FINDINGS: There is no acute or healing fracture or suspicious bony lesion. There is a small osteophyte associated with the patella. There are enthesopathic changes at the quadriceps insertion site on the patella. There is degenerative spurring in the medial and lateral compartments of the knee. There is a small knee joint effusion. Diffuse subcutaneous edema is noted in the visualized lower extremity. IMPRESSION: 1. Small knee effusion with mild tricompartmental osteoarthritis of the knee joint. 2. Diffuse subcutaneous edema. 3. No acute bony abnormality. Electronically Signed   By: Curlene Dolphin M.D.   On: 02/18/2015 16:35   Dg Foot Complete Left  02/18/2015  CLINICAL DATA:  Pain and swelling EXAM: LEFT FOOT - COMPLETE 3+ VIEW COMPARISON:  None. FINDINGS: Prior bunion repair. Two pins in the distal first metatarsal. Extensive degenerative change in the first MTP joint. Wires in the first proximal phalanx. Hammertoe deformities fourth through fifth digits. Negative for fracture.  No acute bony change Calcaneal spurring. IMPRESSION: No acute abnormality.  Chronic changes as above. Electronically Signed   By: Franchot Gallo M.D.   On: 02/18/2015 16:34   Dg Foot Complete Right  02/18/2015  CLINICAL DATA:  Leg pain and swelling EXAM: RIGHT FOOT COMPLETE - 3+ VIEW COMPARISON:  07/03/2014 FINDINGS: Three views of the right foot submitted. Hallux valgus deformity again noted. Again noted degenerative erosive changes first metatarsal phalangeal joint. Small plantar spur of calcaneus again noted.  There is soft tissue swelling dorsal metatarsal region. Mild dorsal spurring tarsal region. IMPRESSION: No acute fracture or subluxation. Hallux valgus deformity. Degenerative changes as described above. Soft tissue swelling dorsal metatarsal region. Electronically Signed   By: Lahoma Crocker M.D.   On: 02/18/2015 16:36   I have personally reviewed and evaluated these images and lab results as part of my medical decision-making.   EKG Interpretation None      MDM  Final diagnoses:  Leg swelling  Leg pain, bilateral    Kurt Sexton presents with bilateral lower leg swelling and pain with increased warmth and oozing for the past few months.  Findings and plan of care discussed with Merrily Pew, MD.  DVT versus edema due to heart failure. X-rays to check for osteomyelitis and subcutaneous gas. With patient's leg swelling, pain, and claudication expect that this patient may have to be admitted. X-rays were free from any abnormalities consistent with infection and were completely free from any acute abnormalities. 5:10 PM patient was reevaluated and states that his pain is well under control. Patient was given the results of the x-rays as well as labs that are available. Patients creatinine of 1.29 and a slight increase from previous BMP results. Troponin is negative. Chest x-ray is clear. BNP is elevated but not to a level to suggest heart failure. These results were communicated with the patient. When patient was reassessed he was found to be resting comfortably and sleeping in the bed.  Duplex US results summary: No evidence of acute deep vein or superficial thrombosis involving the right lower extremity and left common femoral vein. 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 Plan to consult hospitalist for admission for DVT management. Patient has bilateral leg swelling accompanied by pain and is in so much pain that he cannot walk. Plan of care,  including admission, communicated with pt, who agreed to the plan and is comfortable with admission.  Spoke with Dr. Olevia Bowens, hospitalist, who agreed to admit the patient and stated that he would input the temporary admission orders. No further instructions. Filed Vitals:   02/18/15 1642 02/18/15 1700 02/18/15 2033 02/18/15 2227  BP: 172/78 156/64 148/64   Pulse: 81 77 72 80  Temp:   100.8 F (38.2 C) 98 F (36.7 C)  TempSrc:   Rectal Oral  Resp: 20 20 20 23   SpO2: 98% 81% 93% 94%    Lorayne Bender, PA-C 02/18/15 2251  Merrily Pew, MD 02/18/15 2322

## 2015-02-18 NOTE — ED Notes (Signed)
Pt comes from home via [facility], c/o generalized pain and weakness of both legs and ( edema/ pus present). Hx of spinal fusion, good sensation, no hx of neuropathy, borderline diabetic, able to move legs but too weak to walk. No n/v/d no SOB and alert x 4.

## 2015-02-18 NOTE — Progress Notes (Signed)
VASCULAR LAB PRELIMINARY  PRELIMINARY  PRELIMINARY  PRELIMINARY  Bilateral lower extremity venous duplex  completed.    Preliminary report:  Bilateral:  No evidence of DVT, superficial thrombosis, or Baker's Cyst.    Daryan Cagley, RVT 02/18/2015, 7:53 PM

## 2015-02-19 ENCOUNTER — Other Ambulatory Visit (HOSPITAL_COMMUNITY): Payer: Medicare Other

## 2015-02-19 ENCOUNTER — Encounter (HOSPITAL_COMMUNITY): Payer: Self-pay

## 2015-02-19 DIAGNOSIS — I82511 Chronic embolism and thrombosis of right femoral vein: Secondary | ICD-10-CM | POA: Diagnosis not present

## 2015-02-19 DIAGNOSIS — D649 Anemia, unspecified: Secondary | ICD-10-CM | POA: Diagnosis present

## 2015-02-19 LAB — COMPREHENSIVE METABOLIC PANEL
ALBUMIN: 3.7 g/dL (ref 3.5–5.0)
ALK PHOS: 104 U/L (ref 38–126)
ALT: 24 U/L (ref 17–63)
AST: 26 U/L (ref 15–41)
Anion gap: 7 (ref 5–15)
BILIRUBIN TOTAL: 1.1 mg/dL (ref 0.3–1.2)
BUN: 16 mg/dL (ref 6–20)
CO2: 28 mmol/L (ref 22–32)
CREATININE: 1.17 mg/dL (ref 0.61–1.24)
Calcium: 8.7 mg/dL — ABNORMAL LOW (ref 8.9–10.3)
Chloride: 103 mmol/L (ref 101–111)
GFR calc Af Amer: >60 mL/min (ref >60)
GFR calc non Af Amer: 59 mL/min — ABNORMAL LOW (ref >60)
GLUCOSE: 113 mg/dL — AB (ref 65–99)
POTASSIUM: 3.6 mmol/L (ref 3.5–5.1)
Sodium: 138 mmol/L (ref 135–145)
TOTAL PROTEIN: 7 g/dL (ref 6.5–8.1)

## 2015-02-19 LAB — FERRITIN: Ferritin: 74 ng/mL (ref 24–336)

## 2015-02-19 LAB — CBC
HEMATOCRIT: 34.4 % — AB (ref 39.0–52.0)
HEMOGLOBIN: 10.9 g/dL — AB (ref 13.0–17.0)
MCH: 25.8 pg — ABNORMAL LOW (ref 26.0–34.0)
MCHC: 31.7 g/dL (ref 30.0–36.0)
MCV: 81.5 fL (ref 78.0–100.0)
Platelets: 227 10*3/uL (ref 150–400)
RBC: 4.22 MIL/uL (ref 4.22–5.81)
RDW: 13.8 % (ref 11.5–15.5)
WBC: 6.9 10*3/uL (ref 4.0–10.5)

## 2015-02-19 LAB — RETICULOCYTES
RBC.: 4.27 MIL/uL (ref 4.22–5.81)
RETIC COUNT ABSOLUTE: 55.5 10*3/uL (ref 19.0–186.0)
Retic Ct Pct: 1.3 % (ref 0.4–3.1)

## 2015-02-19 LAB — IRON AND TIBC
Iron: 16 ug/dL — ABNORMAL LOW (ref 45–182)
Saturation Ratios: 5 % — ABNORMAL LOW (ref 17.9–39.5)
TIBC: 322 ug/dL (ref 250–450)
UIBC: 306 ug/dL

## 2015-02-19 LAB — VITAMIN B12: Vitamin B-12: 462 pg/mL (ref 180–914)

## 2015-02-19 LAB — FOLATE: Folate: 13.2 ng/mL (ref >5.9)

## 2015-02-19 LAB — MAGNESIUM: MAGNESIUM: 2.1 mg/dL (ref 1.7–2.4)

## 2015-02-19 MED ORDER — HYDROXYZINE HCL 25 MG PO TABS: 25.0000 mg | ORAL_TABLET | Freq: Three times a day (TID) | ORAL | Status: DC | PRN

## 2015-02-19 MED ORDER — OXYCODONE-ACETAMINOPHEN 5-325 MG PO TABS
1.0000 | ORAL_TABLET | ORAL | Status: DC | PRN
  Administered 2015-02-19 (×2): 2 via ORAL
  Administered 2015-02-20: 1 via ORAL
  Filled 2015-02-19 (×2): qty 2
  Filled 2015-02-19: qty 1

## 2015-02-19 MED ORDER — ENOXAPARIN SODIUM 40 MG/0.4ML ~~LOC~~ SOLN
40.0000 mg | SUBCUTANEOUS | Status: DC
  Administered 2015-02-19: 40 mg via SUBCUTANEOUS
  Filled 2015-02-19: qty 0.4

## 2015-02-19 MED ORDER — ATORVASTATIN CALCIUM 10 MG PO TABS
10.0000 mg | ORAL_TABLET | Freq: Every day | ORAL | Status: DC
  Administered 2015-02-19: 10 mg via ORAL
  Filled 2015-02-19: qty 1

## 2015-02-19 MED ORDER — ONDANSETRON HCL 4 MG PO TABS: 4.0000 mg | ORAL_TABLET | Freq: Four times a day (QID) | ORAL | Status: DC | PRN

## 2015-02-19 MED ORDER — PANTOPRAZOLE SODIUM 40 MG PO TBEC
40.0000 mg | DELAYED_RELEASE_TABLET | Freq: Every day | ORAL | Status: DC
  Administered 2015-02-19 – 2015-02-20 (×3): 40 mg via ORAL
  Filled 2015-02-19 (×3): qty 1

## 2015-02-19 MED ORDER — SODIUM CHLORIDE 0.9 % IJ SOLN
3.0000 mL | Freq: Two times a day (BID) | INTRAMUSCULAR | Status: DC
  Administered 2015-02-19 – 2015-02-20 (×4): 3 mL via INTRAVENOUS

## 2015-02-19 MED ORDER — CETYLPYRIDINIUM CHLORIDE 0.05 % MT LIQD
7.0000 mL | Freq: Two times a day (BID) | OROMUCOSAL | Status: DC
  Administered 2015-02-19 – 2015-02-20 (×3): 7 mL via OROMUCOSAL

## 2015-02-19 MED ORDER — ONDANSETRON HCL 4 MG/2ML IJ SOLN: 4.0000 mg | Freq: Four times a day (QID) | INTRAMUSCULAR | Status: DC | PRN

## 2015-02-19 MED ORDER — ENOXAPARIN SODIUM 100 MG/ML ~~LOC~~ SOLN
100.0000 mg | Freq: Two times a day (BID) | SUBCUTANEOUS | Status: DC
  Administered 2015-02-19 (×2): 100 mg via SUBCUTANEOUS
  Filled 2015-02-19 (×2): qty 1

## 2015-02-19 MED ORDER — LABETALOL HCL 100 MG PO TABS
200.0000 mg | ORAL_TABLET | Freq: Three times a day (TID) | ORAL | Status: DC
  Administered 2015-02-19 – 2015-02-20 (×3): 200 mg via ORAL
  Filled 2015-02-19 (×3): qty 2

## 2015-02-19 MED ORDER — TRIAMCINOLONE ACETONIDE 0.1 % EX OINT
1.0000 "application " | TOPICAL_OINTMENT | Freq: Two times a day (BID) | CUTANEOUS | Status: DC | PRN
  Filled 2015-02-19: qty 15

## 2015-02-19 NOTE — H&P (Signed)
[facility] History and Physical  Kurt Sexton [id number] DOB: [birth year] DOA: 02/18/2015  Referring physician:Shawn Rosebud Poles, PA-C and Kurt Sexton, M.D. PCP: Kurt Kehr, MD   Chief Complaint: Left leg and foot pain.  HPI: Kurt Sexton is a 74 y.o. male with a past medical history hypertension, hyperlipidemia osteoarthritis, BPH, history of prostate cancer on admission, CVA, gout, chronic lower back pain who comes to the emergency department via EMS due to generalized pain, edema and weakness on both legs, but particularly so in his left lower extremity. Per patient, he has had edema on his right lower extremity since April of this year, accompanied by the development of hyperpigmentation and keratosis in his feet and clear fluid oozing wounds. He denies fever, chills, night sweats, but has chronic myalgias, feels very tired and fatigued. He denies dyspnea, chest pain, dizziness, diaphoresis, palpitations, PND or orthopnea.  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    Review of Systems:  Constitutional:  Positive fatigue.  No weight loss, night sweats, Fevers, chills,  HEENT:  No headaches, Difficulty swallowing,Tooth/dental problems,Sore throat,  No sneezing, itching, ear ache, nasal congestion, post nasal drip,  Cardio-vascular:  Chronic lower extremity edema. No chest pain, Orthopnea, PND, anasarca, dizziness, palpitations  GI:  No heartburn, indigestion, abdominal pain, nausea, vomiting, diarrhea, change in bowel habits, loss of appetite  Resp:  Denies dyspnea, productive cough, hemoptysis or wheezing. Skin:  Bilateral feet and hands skin changes. GU:  Nocturia once or twice a night. no dysuria, change in color of urine, no urgency or frequency. No flank pain.  Musculoskeletal:  Chronic arthralgias, myalgias and lower back pain.  Psych:  No  change in mood or affect. No depression or anxiety. No memory loss.   Past Medical History  Diagnosis Date  . HTN (hypertension)   . Osteoarthritis   . BPH (benign prostatic hyperplasia)     Dr Kurt Sexton  . CVA (cerebral infarction) 1998  . Vitamin D deficiency   . LBP (low back pain) 2010  . Gout   . Stroke ([place]) 1998  . High cholesterol   . Prostate cancer ([place]) 01/2008    gleason 3+4=7, PSA 4.93, vol 34.18 cc   Past Surgical History  Procedure Laterality Date  . Tonsillectomy    . Hernia repair      x2  . Knee arthroscopy w/ pcl and lcl  repair & tendon graft    . Correction hammer toe    . Prostate biopsy  01/2008    gleason 7  . Prostate biopsy  03/27/13    gleason 3+4=7, volume 34.18 cc  . Tonsillectomy    . Anterior cervical decomp/discectomy fusion N/A 11/22/2014    Procedure: Cervical Six Corpectomy with graft and plating Cervical Five-Seven;  Surgeon: Erline Levine, MD;  Location: [facility];  Service: Neurosurgery;  Laterality: N/A;   Social History:  reports that he has never smoked. He has never used smokeless tobacco. He reports that he does not drink alcohol or use illicit drugs.  Allergies  Allergen Reactions  . Diazepam Other (See Comments)    lethargic  . Amlodipine Besylate Other (See Comments)    dizzy  . Cozaar     Severe headache  . Doxazosin Mesylate Other (See Comments)    dizzy  . Lisinopril Other (See Comments)    headache  . Ramipril Other (See Comments)  headache  . Spironolactone Other (See Comments)    High potassium  . Bactrim [Sulfamethoxazole-Trimethoprim] Rash    Family History  Problem Relation Age of Onset  . Diabetes Other     1st degree relative  . Hypertension Other   . Cancer Brother     prostate, tx w/xrt    Prior to Admission medications   Medication Sig Start Date End Date Taking? Authorizing Provider  aspirin 81 MG EC tablet Take 81 mg by mouth daily.     Yes Historical Provider, MD  atorvastatin (LIPITOR) 10 MG  tablet Take 1 tablet (10 mg total) by mouth daily. 03/27/14  Yes Kurt Sexton V, MD  betamethasone dipropionate (DIPROLENE) 0.05 % cream Apply 1 application topically 2 (two) times daily. Apply to hands, feet, ankles, buttocks and thighs 11/04/14  Yes Historical Provider, MD  hydrOXYzine (ATARAX/VISTARIL) 25 MG tablet Take 1 tablet (25 mg total) by mouth every 8 (eight) hours as needed for itching or nausea. Patient taking differently: Take 25 mg by mouth every 8 (eight) hours as needed for itching.  07/10/14  Yes Kurt Sexton V, MD  labetalol (NORMODYNE) 200 MG tablet Take 1 tablet (200 mg total) by mouth 3 (three) times daily. 01/30/15  Yes Kurt Sexton V, MD  methocarbamol (ROBAXIN) 500 MG tablet Take 1.5 tablets (750 mg total) by mouth every 6 (six) hours as needed for muscle spasms. 11/25/14  Yes Kurt Ny Ditty, MD  oxyCODONE-acetaminophen (PERCOCET/ROXICET) 5-325 MG per tablet Take 1-2 tablets by mouth every 4 (four) hours as needed for moderate pain. 11/25/14  Yes Kurt Ny Ditty, MD  triamcinolone ointment (KENALOG) 0.1 % Apply 1 application topically 2 (two) times daily as needed (skin irritation).  11/04/14  Yes Historical Provider, MD  cimetidine (TAGAMET) 400 MG tablet Take 1 tablet (400 mg total) by mouth 2 (two) times daily. Patient not taking: Reported on 02/18/2015 07/10/14   Kurt Sexton V, MD  gabapentin (NEURONTIN) 300 MG capsule Take 1 capsule (300 mg total) by mouth at bedtime. Patient not taking: Reported on 02/18/2015 12/13/14   Kurt Berthold, DO  meclizine (ANTIVERT) 12.5 MG tablet Take 1 tablet (12.5 mg total) by mouth 3 (three) times daily as needed for dizziness. Patient not taking: Reported on 02/18/2015 07/24/14   Kurt Sexton V, MD  torsemide (DEMADEX) 100 MG tablet Take 0.5-1 tablets (50-100 mg total) by mouth daily. Patient not taking: Reported on 02/18/2015 01/14/15   Kurt Anger, MD   Physical Exam: Filed Vitals:   02/18/15 1642  02/18/15 1700 02/18/15 2033 02/18/15 2227  BP: 172/78 156/64 148/64   Pulse: 81 77 72 80  Temp:   100.8 F (38.2 C) 98 F (36.7 C)  TempSrc:   Rectal Oral  Resp: 20 20 20 23   SpO2: 98% 81% 93% 94%    Wt Readings from Last 3 Encounters:  01/14/15 106.595 kg (235 lb)  12/13/14 104.441 kg (230 lb 4 oz)  11/22/14 104.327 kg (230 lb)    General:  Appears calm and comfortable Eyes: PERRL, normal lids, irises & conjunctiva ENT: grossly normal hearing, lips and oral mucosa are moist. Neck: no LAD, masses or thyromegaly Cardiovascular: RRR, no m/r/g. 3+ LE edema bilaterally. Telemetry: SR, no arrhythmias  Respiratory: CTA bilaterally, no w/r/r. Normal respiratory effort. Abdomen: soft, ntnd Skin: Positive hyperpigmentation hyperkeratosis of both feet, and to a lesser extent on both hands. Positive transudative fluid oozing lesions on both feet without surrounding erythema or calor. Bilateral 3+ pitting edema.  Musculoskeletal: Mildly decreased tone BUE/BLE, likely due to edema. Psychiatric: grossly normal mood and affect, speech fluent and appropriate Neurologic: grossly non-focal.          Labs on Admission:  Basic Metabolic Panel:  Recent Labs Lab 02/18/15 1654  NA 137  K 3.8  CL 104  CO2 27  GLUCOSE 114*  BUN 16  CREATININE 1.29*  CALCIUM 8.7*   CBC:  Recent Labs Lab 02/18/15 1654  WBC 8.9  HGB 10.9*  HCT 34.5*  MCV 81.6  PLT 243    BNP (last 3 results)  Recent Labs  02/18/15 1654  BNP 159.2*     CBG:  Recent Labs Lab 02/18/15 1420  GLUCAP 118*    Radiological Exams on Admission: Dg Chest 2 View  02/18/2015  CLINICAL DATA:  Leg edema EXAM: CHEST  2 VIEW COMPARISON:  10/24/2014 FINDINGS: Normal heart size and mediastinal contours. No acute infiltrate or edema. No effusion or pneumothorax. No acute osseous findings. IMPRESSION: No active cardiopulmonary disease. No cardiomegaly or pulmonary edema. Electronically Signed   By: Monte Fantasia M.D.    On: 02/18/2015 18:09   Dg Tibia/fibula Left  02/18/2015  CLINICAL DATA:  Leg pain and swelling. EXAM: LEFT TIBIA AND FIBULA - 2 VIEW COMPARISON:  None. FINDINGS: Diffuse subcutaneous reticulation visualize lower extremity, greatest at the calf. There is no soft tissue gas or opaque foreign body. Knee osteoarthritis with diffuse marginal spurring. There is a moderate suprapatellar joint effusion. High appearance of the patella but preserved patellar shadow. No acute fracture.  No focal bone lesion. IMPRESSION: 1. Soft tissue swelling without gas or opaque foreign body. No acute osseous finding. 2. Tricompartmental knee osteoarthritis with joint effusion. Electronically Signed   By: Monte Fantasia M.D.   On: 02/18/2015 16:37   Dg Tibia/fibula Right  02/18/2015  CLINICAL DATA:  Leg pain and swelling. Generalized pain and weakness. EXAM: RIGHT TIBIA AND FIBULA - 2 VIEW COMPARISON:  Right foot radiographs same day. FINDINGS: There is no acute or healing fracture or suspicious bony lesion. There is a small osteophyte associated with the patella. There are enthesopathic changes at the quadriceps insertion site on the patella. There is degenerative spurring in the medial and lateral compartments of the knee. There is a small knee joint effusion. Diffuse subcutaneous edema is noted in the visualized lower extremity. IMPRESSION: 1. Small knee effusion with mild tricompartmental osteoarthritis of the knee joint. 2. Diffuse subcutaneous edema. 3. No acute bony abnormality. Electronically Signed   By: Curlene Dolphin M.D.   On: 02/18/2015 16:35   Dg Foot Complete Left  02/18/2015  CLINICAL DATA:  Pain and swelling EXAM: LEFT FOOT - COMPLETE 3+ VIEW COMPARISON:  None. FINDINGS: Prior bunion repair. Two pins in the distal first metatarsal. Extensive degenerative change in the first MTP joint. Wires in the first proximal phalanx. Hammertoe deformities fourth through fifth digits. Negative for fracture.  No acute bony  change Calcaneal spurring. IMPRESSION: No acute abnormality.  Chronic changes as above. Electronically Signed   By: Franchot Gallo M.D.   On: 02/18/2015 16:34   Dg Foot Complete Right  02/18/2015  CLINICAL DATA:  Leg pain and swelling EXAM: RIGHT FOOT COMPLETE - 3+ VIEW COMPARISON:  07/03/2014 FINDINGS: Three views of the right foot submitted. Hallux valgus deformity again noted. Again noted degenerative erosive changes first metatarsal phalangeal joint. Small plantar spur of calcaneus again noted. There is soft tissue swelling dorsal metatarsal region. Mild dorsal spurring tarsal region. IMPRESSION: No  acute fracture or subluxation. Hallux valgus deformity. Degenerative changes as described above. Soft tissue swelling dorsal metatarsal region. Electronically Signed   By: Lahoma Crocker M.D.   On: 02/18/2015 16:36    EKG: Independently reviewed. Vent. rate 80 BPM PR interval 168 ms QRS duration 96 ms QT/QTc 414/478 ms P-R-T axes 58 -4 11 Sinus rhythm Posterior infarct, old  Assessment/Plan Principal Problem:   DVT (deep venous thrombosis) (HCC) Admit to MedSurg. Send protein C, protein C activity, protein S,  protein S activity and homocysteine level. Lovenox 1 mg per KG every 12 hours. Bridge with oral anticoagulant before discharge.  Active Problems:   Edema No proteinuria on UA. I will check albumin level. Check echocardiogram in the morning. Continue oral Demadex.    Osteoarthritis Continue analgesics as needed.    Dyslipidemia Continue atorvastatin 10 mg by mouth daily. Monitor LFTs periodically.      Hypertension Continue labetalol 200 mg by mouth 3 times a day. Continue daily Demadex. Monitor blood pressure closely while the patient is in the hospital. Consider adjusting beta blocker dosage up if needed.    Anemia Check ferritin, TIBC, iron, 123456 and folic acid levels. Check reticulocyte count. Check fecal occult blood.    Code Status: Full code. DVT Prophylaxis:  On full dosegastricx. Family Communication: Disposition Plan: Admit to evaluate edema and treat DVT.  Time spent: Over 70 minutes were spent during the process of this admission including, but not limited to direct patient contact, coordination of care with other healthcare providers, review of medical records, admission orders and documentation.  Reubin Milan [facility] Pager [phone].

## 2015-02-19 NOTE — Consult Note (Addendum)
WOC wound consult note Reason for Consult: Bilateral LEs with dry, flaking skin.  Edema and linear excoriations. Patient cares for LEs at home using paper towel to absorb exudate when it occurs.  Spends most of time on sofa at home with LEs in a dependent position. Patient with ram-horn toenail on right foot, 3rd digit that requires debridement as it is near to creating a wound on the 2nd digit. Patient saw a podiatrist in the past for nail debridement, but has not been able to go in several months due to no longer having a car for transportation. Wound type: Venous insufficiency Pressure Ulcer POA: No Measurement: Linear excoriation on the LLE measuring 0.4cm x 5cm.  Circular ulcer on the distal tip of the 3rd digit, left foot measures 0.4cm round with dried serum obscuring depth. Posterior LLE at Achilles with erythema. Wound [id number] Drainage (amount, consistency, odor) Scant serous Periwound: Dry, flaking Dressing procedure/placement/frequency: I will provide Nursing with guidance for washing and moisturizing LEs prior to having ortho tech place bilateral Unna's Boots today.  These will need to be changed once weekly, by either [facility] if disposition is to be home. [place] nursing team will not follow, but will remain available to this patient, the nursing and medical teams.  Please re-consult if needed. Thanks, Maudie Flakes, MSN, RN, [place], Arther Abbott  Pager# [phone]

## 2015-02-19 NOTE — Progress Notes (Signed)
   [facility]                                                                              Patient Demographics  Kurt Sexton, is a 74 y.o. male, DOB - 1941-03-08, [id number]  Admit date - 02/18/2015   Admitting Physician Reubin Milan, MD  Outpatient Primary MD for the patient is [name], MD  LOS -    Chief Complaint  Patient presents with  . Generalized Body Aches  . Leg Pain     HPI on 02/19/2015 by Dr. Gerri Lins Hilmar [name] is a 74 y.o. male with a past medical history hypertension, hyperlipidemia osteoarthritis, BPH, history of prostate cancer on admission, CVA, gout, chronic lower back pain who comes to the emergency department via EMS due to generalized pain, edema and weakness on both legs, but particularly so in his left lower extremity. Per patient, he has had edema on his right lower extremity since April of this year, accompanied by the development of hyperpigmentation and keratosis in his feet and clear fluid oozing wounds. He denies fever, chills, night sweats, but has chronic myalgias, feels very tired and fatigued. He denies dyspnea, chest pain, dizziness, diaphoresis, palpitations, PND or orthopnea.  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  Assessment & Plan   Patient admitted earlier this morning by Dr. Reubin Milan. Please see full H&P for details. Agree with current assessment and plan.  Lower extremity pain and edema  -Patient was thought to have a DVT upon admission however lower extremity Doppler showed no evidence of DVT, superficial thrombosis or Baker's cyst -07/04/2014 lower joint showed a chronic DVT in the most distal portion of the right femoral vein, nothing acute -Patient initially placed on full dose Lovenox, will transition to DVT prophylaxis -Anticoagulation workup pending -Will obtain  ABIs -Suspect patient has chronic venous insufficiency -Will consult PT for evaluation -Echocardiogram pending -Continue Demadex for edema   Lower extremity wounds -Wound Care consulted and appreciated, recommended Unna boot placement to be changed weekly -Likely secondary to venous insufficiency -Treatment plan as above  Osteoarthritis -Continue pain control  Dyslipidemia -Continue statin  Hypertension -Continue labetalol and Demadex  Normocytic Anemia -Anemia panel: Iron 16, ferritin 74 -Will order dose of Feraheme -Continue to monitor CBC -Baseline hemoglobin 9-10, currently 10.9  Code Status: Full  Family Communication: None at bedside  Disposition Plan: Admitted  Time Spent in minutes   30 minutes  Procedures  LE doppler  Consults   Wound care  DVT Prophylaxis  Lovenox  Markeia Harkless D.O. on 02/19/2015 at 12:00 PM  Between 7am to 7pm - Pager - [phone]  After 7pm go to [email or web address] - password [email or web address]  And look for the night coverage person covering for me after hours  [facility] Office  [phone]

## 2015-02-20 ENCOUNTER — Observation Stay (HOSPITAL_BASED_OUTPATIENT_CLINIC_OR_DEPARTMENT_OTHER): Payer: Medicare Other

## 2015-02-20 ENCOUNTER — Observation Stay (HOSPITAL_COMMUNITY): Payer: Medicare Other

## 2015-02-20 DIAGNOSIS — I8310 Varicose veins of unspecified lower extremity with inflammation: Secondary | ICD-10-CM | POA: Diagnosis not present

## 2015-02-20 DIAGNOSIS — Z8673 Personal history of transient ischemic attack (TIA), and cerebral infarction without residual deficits: Secondary | ICD-10-CM | POA: Diagnosis not present

## 2015-02-20 DIAGNOSIS — M109 Gout, unspecified: Secondary | ICD-10-CM | POA: Diagnosis not present

## 2015-02-20 DIAGNOSIS — E785 Hyperlipidemia, unspecified: Secondary | ICD-10-CM | POA: Diagnosis not present

## 2015-02-20 DIAGNOSIS — R609 Edema, unspecified: Secondary | ICD-10-CM

## 2015-02-20 DIAGNOSIS — R739 Hyperglycemia, unspecified: Secondary | ICD-10-CM | POA: Diagnosis not present

## 2015-02-20 DIAGNOSIS — R55 Syncope and collapse: Secondary | ICD-10-CM | POA: Diagnosis not present

## 2015-02-20 DIAGNOSIS — M2011 Hallux valgus (acquired), right foot: Secondary | ICD-10-CM | POA: Diagnosis not present

## 2015-02-20 DIAGNOSIS — N400 Enlarged prostate without lower urinary tract symptoms: Secondary | ICD-10-CM | POA: Diagnosis not present

## 2015-02-20 DIAGNOSIS — S91302A Unspecified open wound, left foot, initial encounter: Secondary | ICD-10-CM | POA: Diagnosis not present

## 2015-02-20 DIAGNOSIS — M6281 Muscle weakness (generalized): Secondary | ICD-10-CM | POA: Diagnosis not present

## 2015-02-20 DIAGNOSIS — L818 Other specified disorders of pigmentation: Secondary | ICD-10-CM | POA: Diagnosis not present

## 2015-02-20 DIAGNOSIS — Z8546 Personal history of malignant neoplasm of prostate: Secondary | ICD-10-CM | POA: Diagnosis not present

## 2015-02-20 DIAGNOSIS — I872 Venous insufficiency (chronic) (peripheral): Secondary | ICD-10-CM

## 2015-02-20 DIAGNOSIS — D649 Anemia, unspecified: Secondary | ICD-10-CM

## 2015-02-20 DIAGNOSIS — I10 Essential (primary) hypertension: Secondary | ICD-10-CM | POA: Diagnosis not present

## 2015-02-20 DIAGNOSIS — I6523 Occlusion and stenosis of bilateral carotid arteries: Secondary | ICD-10-CM | POA: Diagnosis not present

## 2015-02-20 DIAGNOSIS — Z79899 Other long term (current) drug therapy: Secondary | ICD-10-CM | POA: Diagnosis not present

## 2015-02-20 DIAGNOSIS — M1990 Unspecified osteoarthritis, unspecified site: Secondary | ICD-10-CM | POA: Diagnosis not present

## 2015-02-20 DIAGNOSIS — M545 Low back pain: Secondary | ICD-10-CM | POA: Diagnosis not present

## 2015-02-20 DIAGNOSIS — E7800 Pure hypercholesterolemia, unspecified: Secondary | ICD-10-CM | POA: Diagnosis not present

## 2015-02-20 DIAGNOSIS — M25562 Pain in left knee: Secondary | ICD-10-CM | POA: Diagnosis not present

## 2015-02-20 DIAGNOSIS — E559 Vitamin D deficiency, unspecified: Secondary | ICD-10-CM | POA: Diagnosis not present

## 2015-02-20 DIAGNOSIS — I743 Embolism and thrombosis of arteries of the lower extremities: Secondary | ICD-10-CM | POA: Diagnosis not present

## 2015-02-20 DIAGNOSIS — Z888 Allergy status to other drugs, medicaments and biological substances status: Secondary | ICD-10-CM | POA: Diagnosis not present

## 2015-02-20 DIAGNOSIS — G8929 Other chronic pain: Secondary | ICD-10-CM | POA: Diagnosis not present

## 2015-02-20 DIAGNOSIS — Z881 Allergy status to other antibiotic agents status: Secondary | ICD-10-CM | POA: Diagnosis not present

## 2015-02-20 DIAGNOSIS — M25461 Effusion, right knee: Secondary | ICD-10-CM | POA: Diagnosis not present

## 2015-02-20 DIAGNOSIS — M25462 Effusion, left knee: Secondary | ICD-10-CM | POA: Diagnosis not present

## 2015-02-20 DIAGNOSIS — R202 Paresthesia of skin: Secondary | ICD-10-CM | POA: Diagnosis not present

## 2015-02-20 DIAGNOSIS — K5900 Constipation, unspecified: Secondary | ICD-10-CM | POA: Diagnosis not present

## 2015-02-20 DIAGNOSIS — R600 Localized edema: Secondary | ICD-10-CM | POA: Diagnosis not present

## 2015-02-20 DIAGNOSIS — M25561 Pain in right knee: Secondary | ICD-10-CM | POA: Diagnosis not present

## 2015-02-20 DIAGNOSIS — I517 Cardiomegaly: Secondary | ICD-10-CM | POA: Diagnosis not present

## 2015-02-20 DIAGNOSIS — M79606 Pain in leg, unspecified: Secondary | ICD-10-CM | POA: Diagnosis not present

## 2015-02-20 DIAGNOSIS — Z86718 Personal history of other venous thrombosis and embolism: Secondary | ICD-10-CM | POA: Diagnosis not present

## 2015-02-20 DIAGNOSIS — L570 Actinic keratosis: Secondary | ICD-10-CM | POA: Diagnosis not present

## 2015-02-20 DIAGNOSIS — I82511 Chronic embolism and thrombosis of right femoral vein: Secondary | ICD-10-CM | POA: Diagnosis not present

## 2015-02-20 DIAGNOSIS — R531 Weakness: Secondary | ICD-10-CM | POA: Diagnosis not present

## 2015-02-20 DIAGNOSIS — M1711 Unilateral primary osteoarthritis, right knee: Secondary | ICD-10-CM | POA: Diagnosis not present

## 2015-02-20 DIAGNOSIS — I951 Orthostatic hypotension: Secondary | ICD-10-CM | POA: Diagnosis not present

## 2015-02-20 DIAGNOSIS — M2042 Other hammer toe(s) (acquired), left foot: Secondary | ICD-10-CM | POA: Diagnosis not present

## 2015-02-20 DIAGNOSIS — G894 Chronic pain syndrome: Secondary | ICD-10-CM | POA: Diagnosis not present

## 2015-02-20 DIAGNOSIS — R0600 Dyspnea, unspecified: Secondary | ICD-10-CM | POA: Diagnosis not present

## 2015-02-20 DIAGNOSIS — L309 Dermatitis, unspecified: Secondary | ICD-10-CM | POA: Diagnosis not present

## 2015-02-20 DIAGNOSIS — M62838 Other muscle spasm: Secondary | ICD-10-CM | POA: Diagnosis not present

## 2015-02-20 LAB — BASIC METABOLIC PANEL
Anion gap: 7 (ref 5–15)
BUN: 17 mg/dL (ref 6–20)
CO2: 28 mmol/L (ref 22–32)
CREATININE: 1.19 mg/dL (ref 0.61–1.24)
Calcium: 8.4 mg/dL — ABNORMAL LOW (ref 8.9–10.3)
Chloride: 106 mmol/L (ref 101–111)
GFR, EST NON AFRICAN AMERICAN: 58 mL/min — AB (ref >60)
Glucose, Bld: 119 mg/dL — ABNORMAL HIGH (ref 65–99)
POTASSIUM: 4.4 mmol/L (ref 3.5–5.1)
SODIUM: 141 mmol/L (ref 135–145)

## 2015-02-20 LAB — CBC
HCT: 30.2 % — ABNORMAL LOW (ref 39.0–52.0)
HEMOGLOBIN: 9.7 g/dL — AB (ref 13.0–17.0)
MCH: 26.5 pg (ref 26.0–34.0)
MCHC: 32.1 g/dL (ref 30.0–36.0)
MCV: 82.5 fL (ref 78.0–100.0)
PLATELETS: 213 10*3/uL (ref 150–400)
RBC: 3.66 MIL/uL — AB (ref 4.22–5.81)
RDW: 13.9 % (ref 11.5–15.5)
WBC: 7.8 10*3/uL (ref 4.0–10.5)

## 2015-02-20 MED ORDER — SODIUM CHLORIDE 0.9 % IV SOLN
510.0000 mg | Freq: Once | INTRAVENOUS | Status: DC
  Filled 2015-02-20: qty 17

## 2015-02-20 MED ORDER — OXYCODONE-ACETAMINOPHEN 5-325 MG PO TABS: 1.0000 | ORAL_TABLET | ORAL | Status: DC | PRN

## 2015-02-20 NOTE — Progress Notes (Signed)
  Echocardiogram 2D Echocardiogram has been performed.  Kurt Sexton 02/20/2015, 8:58 AM

## 2015-02-20 NOTE — Progress Notes (Signed)
Report called to Abran Cantor @ [facility].  Lind Guest, RN
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
## 2015-02-20 NOTE — Progress Notes (Signed)
VASCULAR LAB PRELIMINARY  ARTERIAL  ABI completed:  Bilateral ABIs within normal limits.  Bilateral TBIs are abnormal left greater than right.    RIGHT    LEFT    PRESSURE WAVEFORM  PRESSURE WAVEFORM  BRACHIAL 167 Triphasic  BRACHIAL 168 Triphasic   DP 167 Triphasic  DP 166 Triphasic   AT   AT    PT 186 Triphasic  PT  bandage  PER   PER    GREAT TOE 92 WNL GREAT TOE 60 WNL    RIGHT LEFT  ABI 1.11 0.99  TBI 0.55 0.36     Caeli Linehan, RVT 02/20/2015, 10:08 AM

## 2015-02-20 NOTE — Clinical Social Work Placement (Signed)
   CLINICAL SOCIAL WORK PLACEMENT  NOTE  Date:  02/20/2015  Patient Details  Name: Kurt Sexton MRN: [id number] Date of Birth: 09-11-1940  Clinical Social Work is seeking post-discharge placement for this patient at the [place] level of care (*CSW will initial, date and re-position this form in  chart as items are completed):  Yes   Patient/family provided with [facility] Work Department's list of facilities offering this level of care within the geographic area requested by the patient (or if unable, by the patient's family).  Yes   Patient/family informed of their freedom to choose among providers that offer the needed level of care, that participate in Medicare, Medicaid or managed care program needed by the patient, have an available bed and are willing to accept the patient.  Yes   Patient/family informed of [facility]'s ownership interest in [facility] and [facility], as well as of the fact that they are under no obligation to receive care at these facilities.  PASRR submitted to EDS on 02/20/15     PASRR number received on 02/20/15     Existing PASRR number confirmed on       FL2 transmitted to all facilities in geographic area requested by pt/family on 02/20/15     FL2 transmitted to all facilities within larger geographic area on       Patient informed that his/her managed care company has contracts with or will negotiate with certain facilities, including the following:        Yes   Patient/family informed of bed offers received.  Patient chooses bed at [facility] recommends and patient chooses bed at      Patient to be transferred to [facility] on 02/20/15.  Patient to be transferred to facility by ambulance Corey Harold)     Patient family notified on 02/20/15 of transfer.  Name of family member notified:  pt and pt wife, Mardene Celeste notified at bedside     PHYSICIAN Please sign  FL2     Additional Comment:    _______________________________________________ Ladell Pier, LCSW 02/20/2015, 2:12 PM

## 2015-02-20 NOTE — Progress Notes (Signed)
Spoke with pt and wife concerning HH vs SNF.  Both selected SNF related to pt unable to stand or walk. No one is at home to assist the pt. Both selected [facility] CSW to follow.

## 2015-02-20 NOTE — Discharge Summary (Signed)
Physician Discharge Summary  Kurt Sexton [id number] DOB: 07-Sep-1940 DOA: 02/18/2015  PCP: Kurt Kehr, MD  Admit date: 02/18/2015 Discharge date: 02/20/2015  Time spent: 45 minutes  Recommendations for Outpatient Follow-up:  Patient will be discharged to skilled nursing facility.  Patient will need to continue physical as well as occupational therapy is recommended by the facility. He will need to have weekly Unna boot changes..  Patient will need to follow up with primary care provider within one week of discharge.   Primary care physician should follow-up on patient's anticoagulation workup as well as ABI results. Patient should continue medications as prescribed.  Patient should follow a heart healthy diet.   Discharge Diagnoses:  Left knee pain and edema Lower extremity wounds/likely venous insufficiency Osteoarthritis Dyslipidemia Hypertension Normocytic anemia  Discharge Condition: Stable  Diet recommendation: Heart healthy  Filed Weights   02/19/15 0109  Weight: 104.3 kg (229 lb 15 oz)    History of present illness:  on 02/19/2015 by Dr. Gerri Sexton Kurt Sexton is a 74 y.o. male with a past medical history hypertension, hyperlipidemia osteoarthritis, BPH, history of prostate cancer on admission, CVA, gout, chronic lower back pain who comes to the emergency department via EMS due to generalized pain, edema and weakness on both legs, but particularly so in his left lower extremity. Per patient, he has had edema on his right lower extremity since April of this year, accompanied by the development of hyperpigmentation and keratosis in his feet and clear fluid oozing wounds. He denies fever, chills, night sweats, but has chronic myalgias, feels very tired and fatigued. He denies dyspnea, chest pain, dizziness, diaphoresis, palpitations, PND or orthopnea.  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, nothing acute -Patient initially placed on full dose Lovenox, will transition to DVT prophylaxis -Anticoagulation workup pending -ABI pending -Suspect patient has chronic venous insufficiency -PT and OT consulted, PT recommended SNF -Echocardiogram [id number], grade 1 diastolic dysfunction -Continue Demadex for edema  Lower extremity wounds -Wound Care consulted and appreciated, recommended Unna boot placement to be changed weekly -Likely secondary to venous insufficiency -Treatment plan as above  Osteoarthritis -Continue pain control  Dyslipidemia -Continue statin  Hypertension -Continue labetalol and Demadex  Normocytic Anemia -Anemia panel: Iron 16, ferritin 74 -Given a dose of Feraheme -Continue to monitor CBC -Baseline hemoglobin 9-10, currently 9.7  Procedures  LE doppler Echocardiogram  Consults  Wound care  Discharge Exam: Filed Vitals:   02/20/15 1004 02/20/15 1331  BP: 153/54 155/59  Pulse: 84 73  Temp: 98.2 F (36.8 C) 98.7 F (37.1 C)  Resp: 18 18   Exam  General: Well developed, well nourished, NAD, appears stated age  HEENT: NCAT,mucous membranes moist.   Cardiovascular: S1 S2 auscultated, no rubs, murmurs or gallops. Regular rate and rhythm.  Respiratory: Clear to auscultation bilaterally with equal chest rise  Abdomen: Soft, nontender, nondistended, + bowel sounds  Extremities: warm dry without cyanosis clubbing. Unna boots in place  Neuro: AAOx3, nonfocal  Psych: Normal affect and demeanor  Discharge Instructions      Discharge Instructions    Discharge instructions  Complete by:   As directed   Patient will be discharged to skilled nursing facility.  Patient will need to continue physical as well as occupational therapy is recommended by the facility. He will need to have weekly Unna boot changes..  Patient will need to follow up with primary care provider within one week of discharge.   Primary care physician should follow-up on patient's anticoagulation workup as well as ABI results. Patient should continue medications as prescribed.  Patient should follow a heart healthy diet.            Medication List    STOP taking these medications        cimetidine 400 MG tablet  Commonly known as:  TAGAMET     gabapentin 300 MG capsule  Commonly known as:  NEURONTIN     meclizine 12.5 MG tablet  Commonly known as:  ANTIVERT     torsemide 100 MG tablet  Commonly known as:  DEMADEX      TAKE these medications        aspirin 81 MG EC tablet  Take 81 mg by mouth daily.     atorvastatin 10 MG tablet  Commonly known as:  LIPITOR  Take 1 tablet (10 mg total) by mouth daily.     betamethasone dipropionate 0.05 % cream  Commonly known as:  DIPROLENE  Apply 1 application topically 2 (two) times daily. Apply to hands, feet, ankles, buttocks and thighs     hydrOXYzine 25 MG tablet  Commonly known as:  ATARAX/VISTARIL  Take 1 tablet (25 mg total) by mouth every 8 (eight) hours as needed for itching or nausea.     labetalol 200 MG tablet  Commonly known as:  NORMODYNE  Take 1 tablet (200 mg total) by mouth 3 (three) times daily.     methocarbamol 500 MG tablet  Commonly known as:  ROBAXIN  Take 1.5 tablets (750 mg total) by mouth every 6 (six) hours as needed for muscle spasms.     oxyCODONE-acetaminophen 5-325 MG tablet  Commonly known as:  PERCOCET/ROXICET  Take 1-2 tablets by mouth every 4 (four) hours as needed for moderate pain.     triamcinolone ointment 0.1 %  Commonly known as:  KENALOG  Apply 1 application topically 2 (two) times daily as needed (skin  irritation).       Allergies  Allergen Reactions  . Diazepam Other (See Comments)    lethargic  . Amlodipine Besylate Other (See Comments)    dizzy  . Cozaar     Severe headache  . Doxazosin Mesylate Other (See Comments)    dizzy  . Lisinopril Other (See Comments)    headache  . Ramipril Other (See Comments)    headache  . Spironolactone Other (See Comments)    High potassium  . Bactrim [Sulfamethoxazole-Trimethoprim] Rash   Follow-up Information    Follow up with Kurt Kehr, MD. Schedule an appointment as soon as possible for a visit in 1 week.   Specialty:  Internal Medicine   Why:  Hospital follow up   Contact information:   [place] [address] [phone]        The results of significant diagnostics from this hospitalization (including imaging, microbiology, ancillary and laboratory) are listed below for reference.    Significant Diagnostic Studies: Dg Chest 2 View  02/18/2015  CLINICAL DATA:  Leg edema EXAM: CHEST  2 VIEW COMPARISON:  10/24/2014 FINDINGS: Normal heart size and mediastinal contours. No acute  infiltrate or edema. No effusion or pneumothorax. No acute osseous findings. IMPRESSION: No active cardiopulmonary disease. No cardiomegaly or pulmonary edema. Electronically Signed   By: Monte Fantasia M.D.   On: 02/18/2015 18:09   Dg Tibia/fibula Left  02/18/2015  CLINICAL DATA:  Leg pain and swelling. EXAM: LEFT TIBIA AND FIBULA - 2 VIEW COMPARISON:  None. FINDINGS: Diffuse subcutaneous reticulation visualize lower extremity, greatest at the calf. There is no soft tissue gas or opaque foreign body. Knee osteoarthritis with diffuse marginal spurring. There is a moderate suprapatellar joint effusion. High appearance of the patella but preserved patellar shadow. No acute fracture.  No focal bone lesion. IMPRESSION: 1. Soft tissue swelling without gas or opaque foreign body. No acute osseous finding. 2. Tricompartmental knee osteoarthritis with  joint effusion. Electronically Signed   By: Monte Fantasia M.D.   On: 02/18/2015 16:37   Dg Tibia/fibula Right  02/18/2015  CLINICAL DATA:  Leg pain and swelling. Generalized pain and weakness. EXAM: RIGHT TIBIA AND FIBULA - 2 VIEW COMPARISON:  Right foot radiographs same day. FINDINGS: There is no acute or healing fracture or suspicious bony lesion. There is a small osteophyte associated with the patella. There are enthesopathic changes at the quadriceps insertion site on the patella. There is degenerative spurring in the medial and lateral compartments of the knee. There is a small knee joint effusion. Diffuse subcutaneous edema is noted in the visualized lower extremity. IMPRESSION: 1. Small knee effusion with mild tricompartmental osteoarthritis of the knee joint. 2. Diffuse subcutaneous edema. 3. No acute bony abnormality. Electronically Signed   By: Curlene Dolphin M.D.   On: 02/18/2015 16:35   Dg Foot Complete Left  02/18/2015  CLINICAL DATA:  Pain and swelling EXAM: LEFT FOOT - COMPLETE 3+ VIEW COMPARISON:  None. FINDINGS: Prior bunion repair. Two pins in the distal first metatarsal. Extensive degenerative change in the first MTP joint. Wires in the first proximal phalanx. Hammertoe deformities fourth through fifth digits. Negative for fracture.  No acute bony change Calcaneal spurring. IMPRESSION: No acute abnormality.  Chronic changes as above. Electronically Signed   By: Franchot Gallo M.D.   On: 02/18/2015 16:34   Dg Foot Complete Right  02/18/2015  CLINICAL DATA:  Leg pain and swelling EXAM: RIGHT FOOT COMPLETE - 3+ VIEW COMPARISON:  07/03/2014 FINDINGS: Three views of the right foot submitted. Hallux valgus deformity again noted. Again noted degenerative erosive changes first metatarsal phalangeal joint. Small plantar spur of calcaneus again noted. There is soft tissue swelling dorsal metatarsal region. Mild dorsal spurring tarsal region. IMPRESSION: No acute fracture or subluxation. Hallux  valgus deformity. Degenerative changes as described above. Soft tissue swelling dorsal metatarsal region. Electronically Signed   By: Lahoma Crocker M.D.   On: 02/18/2015 16:36    Microbiology: No results found for this or any previous visit (from the past 240 hour(s)).   Labs: Basic Metabolic Panel:  Recent Labs Lab 02/18/15 1654 02/19/15 0556 02/20/15 0525  NA 137 138 141  K 3.8 3.6 4.4  CL 104 103 106  CO2 27 28 28   GLUCOSE 114* 113* 119*  BUN 16 16 17   CREATININE 1.29* 1.17 1.19  CALCIUM 8.7* 8.7* 8.4*  MG  --  2.1  --    Liver Function Tests:  Recent Labs Lab 02/19/15 0556  AST 26  ALT 24  ALKPHOS 104  BILITOT 1.1  PROT 7.0  ALBUMIN 3.7   No results for input(s): LIPASE, AMYLASE in the last 168 hours. No  results for input(s): AMMONIA in the last 168 hours. CBC:  Recent Labs Lab 02/18/15 1654 02/19/15 0556 02/20/15 0525  WBC 8.9 6.9 7.8  HGB 10.9* 10.9* 9.7*  HCT 34.5* 34.4* 30.2*  MCV 81.6 81.5 82.5  PLT 243 227 213   Cardiac Enzymes: No results for input(s): CKTOTAL, CKMB, CKMBINDEX, TROPONINI in the last 168 hours. BNP: BNP (last 3 results)  Recent Labs  02/18/15 1654  BNP 159.2*    ProBNP (last 3 results) No results for input(s): PROBNP in the last 8760 hours.  CBG:  Recent Labs Lab 02/18/15 1420  GLUCAP 118*       Signed:  Cristal Ford  [facility] 02/20/2015, 2:18 PM

## 2015-02-20 NOTE — Discharge Instructions (Signed)

## 2015-02-20 NOTE — Evaluation (Signed)
Physical Therapy Evaluation Patient Details Name: Kurt Sexton MRN: [id number] DOB: 09-15-1940 Today's Date: 02/20/2015   History of Present Illness  74 yo male admitted with bil LEs pain and edema, chronic LE DVT, weakness. Hx of ACDF 11/2014, CVA, prostate cancer, HTN, gout.   Clinical Impression  On eval, pt required Min assist for bed mobility. Attempted standing x 3-pt unable to stand even with receiving Max assist for therapist to do so. Mobility is significantly limited by pain in R knee and L LE. At this time, recommend ST rehab at [facility].     Follow Up Recommendations SNF    Equipment Recommendations  None recommended by PT    Recommendations for Other Services       Precautions / Restrictions Precautions Precautions: Fall Restrictions Weight Bearing Restrictions: No      Mobility  Bed Mobility Overal bed mobility: Needs Assistance Bed Mobility: Supine to Sit;Sit to Supine     Supine to sit: Min assist Sit to supine: Min assist   General bed mobility comments: Assist for L LE.   Transfers Overall transfer level: Needs assistance Equipment used: Rolling walker (2 wheeled) Transfers: Sit to/from Stand Sit to Stand: From elevated surface         General transfer comment: Attempted stand from highly elevated bed x3-pt unable due to pain  Ambulation/Gait             General Gait Details: NT-pt unable at this time  Stairs            Wheelchair Mobility    Modified Rankin (Stroke Patients Only)       Balance                                             Pertinent Vitals/Pain Pain Assessment: 0-10 Pain Score: 10-Worst pain ever Pain Location: bil LEs-R knee, L worse than R Pain Descriptors / Indicators: Sharp;Stabbing;Aching Pain Intervention(s): Limited activity within patient's tolerance;Repositioned    Home Living Family/patient expects to be discharged to:: Unsure Living Arrangements: Spouse/significant  other Available Help at Discharge: Family;Available 24 hours/day Type of Home: House Home Access: Level entry     Home Layout: Two level;Able to live on main level with bedroom/bathroom Home Equipment: Kasandra Knudsen - single point;Walker - 2 wheels;Bedside commode      Prior Function Level of Independence: Independent               Hand Dominance   Dominant Hand: Right    Extremity/Trunk Assessment   Upper Extremity Assessment: Overall WFL for tasks assessed           Lower Extremity Assessment: Generalized weakness;LLE deficits/detail;RLE deficits/detail   LLE Deficits / Details: able to perform ankle pump but unable to tolerate any other ROM  Cervical / Trunk Assessment: Normal  Communication   Communication: No difficulties  Cognition Arousal/Alertness: Awake/alert Behavior During Therapy: WFL for tasks assessed/performed Overall Cognitive Status: Within Functional Limits for tasks assessed                      General Comments      Exercises        Assessment/Plan    PT Assessment Patient needs continued PT services  PT Diagnosis Difficulty walking;Acute pain;Generalized weakness   PT Problem List Decreased strength;Decreased range of motion;Decreased activity tolerance;Decreased balance;Decreased mobility;Decreased knowledge of use  of DME;Pain  PT Treatment Interventions DME instruction;Gait training;Functional mobility training;Therapeutic activities;Patient/family education;Balance training;Therapeutic exercise   PT Goals (Current goals can be found in the Care Plan section) Acute Rehab PT Goals Patient Stated Goal: less pan. to be able to walk PT Goal Formulation: With patient Time For Goal Achievement: 03/06/15 Potential to Achieve Goals: Good    Frequency Min 3X/week   Barriers to discharge        Co-evaluation               End of Session Equipment Utilized During Treatment: Gait belt Activity Tolerance: Patient limited by  pain Patient left: in bed;with call bell/phone within reach;with bed alarm set      Functional Assessment Tool Used: clinical judgement Functional Limitation: Mobility: Walking and moving around Mobility: Walking and Moving Around Current Status [id number]): At least 40 percent but less than 60 percent impaired, limited or restricted Mobility: Walking and Moving Around Goal Status [phone]): At least 20 percent but less than 40 percent impaired, limited or restricted    Time: 1025-1045 PT Time Calculation (min) (ACUTE ONLY): 20 min   Charges:   PT Evaluation $Initial PT Evaluation Tier I: 1 Procedure     PT G Codes:   PT G-Codes **NOT FOR INPATIENT CLASS** Functional Assessment Tool Used: clinical judgement Functional Limitation: Mobility: Walking and moving around Mobility: Walking and Moving Around Current Status [id number]): At least 40 percent but less than 60 percent impaired, limited or restricted Mobility: Walking and Moving Around Goal Status [phone]): At least 20 percent but less than 40 percent impaired, limited or restricted    Weston Anna, MPT Pager: [phone]

## 2015-02-20 NOTE — Progress Notes (Signed)
[facility]                                                                              Patient Demographics  Kurt Sexton, is a 74 y.o. male, DOB - [birth year], [id number]  Admit date - 02/18/2015   Admitting Physician Reubin Milan, MD  Outpatient Primary MD for the patient is [name], MD  LOS -    Chief Complaint  Patient presents with  . Generalized Body Aches  . Leg Pain      HPI on 02/19/2015 by Dr. Gerri Lins Kurt Sexton is a 74 y.o. male with a past medical history hypertension, hyperlipidemia osteoarthritis, BPH, history of prostate cancer on admission, CVA, gout, chronic lower back pain who comes to the emergency department via EMS due to generalized pain, edema and weakness on both legs, but particularly so in his left lower extremity. Per patient, he has had edema on his right lower extremity since April of this year, accompanied by the development of hyperpigmentation and keratosis in his feet and clear fluid oozing wounds. He denies fever, chills, night sweats, but has chronic myalgias, feels very tired and fatigued. He denies dyspnea, chest pain, dizziness, diaphoresis, palpitations, PND or orthopnea.  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, nothing acute -Patient initially placed on full dose Lovenox, will transition to DVT prophylaxis -Anticoagulation workup pending -ABI pending -Suspect patient has chronic venous insufficiency -PT and OT consulted, PT recommended SNF -Echocardiogram [id number], grade 1 diastolic  dysfunction -Continue Demadex for edema  Lower extremity wounds -Wound Care consulted and appreciated, recommended Unna boot placement to be changed weekly -Likely secondary to venous insufficiency -Treatment plan as above  Osteoarthritis -Continue pain control  Dyslipidemia -Continue statin  Hypertension -Continue labetalol and Demadex  Normocytic Anemia -Anemia panel: Iron 16, ferritin 74 -Given a dose of Feraheme -Continue to monitor CBC -Baseline hemoglobin 9-10, currently 9.7  Code Status: Full  Family Communication: None at bedside  Disposition Plan: Admitted for observation. Pending ABI and SNF  Time Spent in minutes 30 minutes  Procedures  LE doppler Echocardiogram  Consults  Wound care  DVT Prophylaxis Lovenox  Lab Results  Component Value Date   PLT 213 02/20/2015    Medications  Scheduled Meds: . antiseptic oral rinse  7 mL Mouth Rinse BID  . atorvastatin  10 mg Oral q1800  . enoxaparin (LOVENOX) injection  40 mg Subcutaneous Q24H  . labetalol  200 mg Oral TID  . pantoprazole  40 mg Oral Daily  . sodium chloride  3 mL Intravenous Q12H   Continuous Infusions:  PRN Meds:.hydrOXYzine, ondansetron **OR** ondansetron (ZOFRAN) IV, oxyCODONE-acetaminophen, triamcinolone ointment  Antibiotics    Anti-infectives    None      Subjective:   Sisto Ingrum seen and  examined today. Patient continues complaining of leg and knee pain. He still feels weak. Patient denies any chest pain, shortness breath, abdominal pain, nausea or vomiting, dizziness or headache.  Objective:   Filed Vitals:   02/19/15 1417 02/19/15 2109 02/20/15 0505 02/20/15 1004  BP: 110/47 157/57 160/64 153/54  Pulse: 64 81 73 84  Temp: 98.2 F (36.8 C) 98.6 F (37 C) 99.4 F (37.4 C) 98.2 F (36.8 C)  TempSrc: Oral Oral Oral Oral  Resp: 17 18 18 18   Height:      Weight:      SpO2: 100% 100% 98% 100%    Wt Readings from Last 3 Encounters:  02/19/15 104.3 kg  (229 lb 15 oz)  01/14/15 106.595 kg (235 lb)  12/13/14 104.441 kg (230 lb 4 oz)     Intake/Output Summary (Last 24 hours) at 02/20/15 1311 Last data filed at 02/20/15 0909  Gross per 24 hour  Intake    240 ml  Output   1050 ml  Net   -810 ml    Exam  General: Well developed, well nourished, NAD, appears stated age  37: NCAT,mucous membranes moist.   Cardiovascular: S1 S2 auscultated, no rubs, murmurs or gallops. Regular rate and rhythm.  Respiratory: Clear to auscultation bilaterally with equal chest rise  Abdomen: Soft, nontender, nondistended, + bowel sounds  Extremities: warm dry without cyanosis clubbing. Unna boots in place  Neuro: AAOx3, nonfocal  Psych: Normal affect and demeanor  Data Review   Micro Results No results found for this or any previous visit (from the past 240 hour(s)).  Radiology Reports Dg Chest 2 View  02/18/2015  CLINICAL DATA:  Leg edema EXAM: CHEST  2 VIEW COMPARISON:  10/24/2014 FINDINGS: Normal heart size and mediastinal contours. No acute infiltrate or edema. No effusion or pneumothorax. No acute osseous findings. IMPRESSION: No active cardiopulmonary disease. No cardiomegaly or pulmonary edema. Electronically Signed   By: Monte Fantasia M.D.   On: 02/18/2015 18:09   Dg Tibia/fibula Left  02/18/2015  CLINICAL DATA:  Leg pain and swelling. EXAM: LEFT TIBIA AND FIBULA - 2 VIEW COMPARISON:  None. FINDINGS: Diffuse subcutaneous reticulation visualize lower extremity, greatest at the calf. There is no soft tissue gas or opaque foreign body. Knee osteoarthritis with diffuse marginal spurring. There is a moderate suprapatellar joint effusion. High appearance of the patella but preserved patellar shadow. No acute fracture.  No focal bone lesion. IMPRESSION: 1. Soft tissue swelling without gas or opaque foreign body. No acute osseous finding. 2. Tricompartmental knee osteoarthritis with joint effusion. Electronically Signed   By: Monte Fantasia  M.D.   On: 02/18/2015 16:37   Dg Tibia/fibula Right  02/18/2015  CLINICAL DATA:  Leg pain and swelling. Generalized pain and weakness. EXAM: RIGHT TIBIA AND FIBULA - 2 VIEW COMPARISON:  Right foot radiographs same day. FINDINGS: There is no acute or healing fracture or suspicious bony lesion. There is a small osteophyte associated with the patella. There are enthesopathic changes at the quadriceps insertion site on the patella. There is degenerative spurring in the medial and lateral compartments of the knee. There is a small knee joint effusion. Diffuse subcutaneous edema is noted in the visualized lower extremity. IMPRESSION: 1. Small knee effusion with mild tricompartmental osteoarthritis of the knee joint. 2. Diffuse subcutaneous edema. 3. No acute bony abnormality. Electronically Signed   By: Curlene Dolphin M.D.   On: 02/18/2015 16:35   Dg Foot Complete Left  02/18/2015  CLINICAL DATA:  Pain  and swelling EXAM: LEFT FOOT - COMPLETE 3+ VIEW COMPARISON:  None. FINDINGS: Prior bunion repair. Two pins in the distal first metatarsal. Extensive degenerative change in the first MTP joint. Wires in the first proximal phalanx. Hammertoe deformities fourth through fifth digits. Negative for fracture.  No acute bony change Calcaneal spurring. IMPRESSION: No acute abnormality.  Chronic changes as above. Electronically Signed   By: Franchot Gallo M.D.   On: 02/18/2015 16:34   Dg Foot Complete Right  02/18/2015  CLINICAL DATA:  Leg pain and swelling EXAM: RIGHT FOOT COMPLETE - 3+ VIEW COMPARISON:  07/03/2014 FINDINGS: Three views of the right foot submitted. Hallux valgus deformity again noted. Again noted degenerative erosive changes first metatarsal phalangeal joint. Small plantar spur of calcaneus again noted. There is soft tissue swelling dorsal metatarsal region. Mild dorsal spurring tarsal region. IMPRESSION: No acute fracture or subluxation. Hallux valgus deformity. Degenerative changes as described above.  Soft tissue swelling dorsal metatarsal region. Electronically Signed   By: Lahoma Crocker M.D.   On: 02/18/2015 16:36    CBC  Recent Labs Lab 02/18/15 1654 02/19/15 0556 02/20/15 0525  WBC 8.9 6.9 7.8  HGB 10.9* 10.9* 9.7*  HCT 34.5* 34.4* 30.2*  PLT 243 227 213  MCV 81.6 81.5 82.5  MCH 25.8* 25.8* 26.5  MCHC 31.6 31.7 32.1  RDW 13.9 13.8 13.9    Chemistries   Recent Labs Lab 02/18/15 1654 02/19/15 0556 02/20/15 0525  NA 137 138 141  K 3.8 3.6 4.4  CL 104 103 106  CO2 27 28 28   GLUCOSE 114* 113* 119*  BUN 16 16 17   CREATININE 1.29* 1.17 1.19  CALCIUM 8.7* 8.7* 8.4*  MG  --  2.1  --   AST  --  26  --   ALT  --  24  --   ALKPHOS  --  104  --   BILITOT  --  1.1  --    ------------------------------------------------------------------------------------------------------------------ estimated creatinine clearance is 68.6 mL/min (by C-G formula based on Cr of 1.19). ------------------------------------------------------------------------------------------------------------------ No results for input(s): HGBA1C in the last 72 hours. ------------------------------------------------------------------------------------------------------------------ No results for input(s): CHOL, HDL, LDLCALC, TRIG, CHOLHDL, LDLDIRECT in the last 72 hours. ------------------------------------------------------------------------------------------------------------------ No results for input(s): TSH, T4TOTAL, T3FREE, THYROIDAB in the last 72 hours.  Invalid input(s): FREET3 ------------------------------------------------------------------------------------------------------------------  Recent Labs  02/19/15 0556  VITAMINB12 462  FOLATE 13.2  FERRITIN 74  TIBC 322  IRON 16*  RETICCTPCT 1.3    Coagulation profile No results for input(s): INR, PROTIME in the last 168 hours.  No results for input(s): DDIMER in the last 72 hours.  Cardiac Enzymes No results for input(s): CKMB,  TROPONINI, MYOGLOBIN in the last 168 hours.  Invalid input(s): CK ------------------------------------------------------------------------------------------------------------------ Invalid input(s): POCBNP    Katai Marsico D.O. on 02/20/2015 at 1:11 PM  Between 7am to 7pm - Pager - [phone]  After 7pm go to [email or web address] - password [email or web address]  And look for the night coverage person covering for me after hours  [facility] Office  [phone]

## 2015-02-20 NOTE — Clinical Social Work Note (Signed)
Clinical Social Work Assessment  Patient Details  Name: Kurt Sexton MRN: [id number] Date of Birth: Dec 19, 1940  Date of referral:  02/20/15               Reason for consult:  Discharge Planning                Permission sought to share information with:  Family Supports Permission granted to share information::  Yes, Verbal Permission Granted  Name::        Agency::  [facility]  Relationship::  wife  Contact Information:  [phone]  Housing/Transportation Living arrangements for the past 2 months:  [place] of Information:  Patient, Spouse Patient Interpreter Needed:  None Criminal Activity/Legal Involvement Pertinent to Current Situation/Hospitalization:  No - Comment as needed Significant Relationships:  Spouse Lives with:  Spouse Do you feel safe going back to the place where you live?  No Need for family participation in patient care:  Yes (Comment) (pt wife at bedside)  Care giving concerns:  Pt admitted from home with pt spouse. PT evaluation completed today and PT recommending SNF.    Social Worker assessment / plan:  CSW received notification that PT recommending SNF and pt medically ready for discharge today.   CSW met with pt and pt wife at bedside. CSW introduced self and explained role. CSW discussed with pt and pt wife recommendation for SNF for short term rehab and explained that pt medically ready today. Pt wife expressed that she is familiar with [facility] and [place] would be preference as it is close to pt home. CSW discussed process of SNF search and pt agreeable to [facility]. SNF search.   CSW completed FL2 and initiated SNF search to [facility]. CSW contacted [facility] to notify of pt interest. [facility] reviewed clinical information and offered pt a bed and confirmed bed availability for today.   CSW followed up with pt and pt wife at bedside to notify pt and pt  wife that [facility] is able to offer a bed and bed available today. Pt and pt wife are agreeable to [facility] and transition to SNF today.   CSW notified MD.  CSW facilitated pt discharge needs including contacting facility, faxing pt discharge information via [place], discussing with pt and pt wife, Mardene Celeste at bedside, providing RN phone number to call report, and arranging ambulance transport for pt to [facility].  No further social work needs identified at this time.  CSW signing off.   Employment status:  Retired Nurse, adult PT Recommendations:  [place] / Referral to community resources:  [place]  Patient/Family's Response to care:  Pt alert and oriented x 4. Pt quiet during assessment and had eyes closed to rest. Pt wife supportive and actively involved. Pt wife pleased that [facility] is able to offer pt placement.  Patient/Family's Understanding of and Emotional Response to Diagnosis, Current Treatment, and Prognosis:  Pt wife expressed that she missed the MD today when MD rounded and request an update. CSW notified MD and MD agreeable to update pt wife.   Emotional Assessment Appearance:  Appears stated age Attitude/Demeanor/Rapport:  Other (pt appropriate) Affect (typically observed):  Appropriate, Calm Orientation:  Oriented to Self, Oriented to Place, Oriented to  Time, Oriented to Situation Alcohol / Substance use:  Not Applicable Psych involvement (Current and /or in  the community):  No (Comment)  Discharge Needs  Concerns to be addressed:  Discharge Planning Concerns Readmission within the last 30 days:  No Current discharge risk:  Physical Impairment Barriers to Discharge:  No Barriers Identified   [place], [place], LCSW 02/20/2015, 3:41 PM  [phone]

## 2015-02-21 ENCOUNTER — Telehealth: Payer: Self-pay | Admitting: *Deleted

## 2015-02-21 LAB — PROTEIN C, TOTAL: Protein C, Total: 72 % (ref 60–150)

## 2015-02-21 LAB — PROTEIN S, TOTAL: PROTEIN S AG TOTAL: 111 % (ref 60–150)

## 2015-02-21 LAB — PROTEIN C ACTIVITY: PROTEIN C ACTIVITY: 111 % (ref 73–180)

## 2015-02-21 LAB — PROTEIN S ACTIVITY: PROTEIN S ACTIVITY: 88 % (ref 63–140)

## 2015-02-21 NOTE — Telephone Encounter (Signed)
Pt was on TCM list was admitted for ? DVT, but after testing dx with chronic venous insufficieny. D/C 02/20/15 sent to SNF...Kurt Sexton

## 2015-02-24 ENCOUNTER — Encounter: Payer: Self-pay | Admitting: Internal Medicine

## 2015-02-24 ENCOUNTER — Non-Acute Institutional Stay (SKILLED_NURSING_FACILITY): Payer: Medicare Other | Admitting: Internal Medicine

## 2015-02-24 DIAGNOSIS — I872 Venous insufficiency (chronic) (peripheral): Secondary | ICD-10-CM | POA: Diagnosis not present

## 2015-02-24 DIAGNOSIS — I83019 Varicose veins of right lower extremity with ulcer of unspecified site: Secondary | ICD-10-CM

## 2015-02-24 DIAGNOSIS — I10 Essential (primary) hypertension: Secondary | ICD-10-CM | POA: Diagnosis not present

## 2015-02-24 DIAGNOSIS — I82511 Chronic embolism and thrombosis of right femoral vein: Secondary | ICD-10-CM

## 2015-02-24 DIAGNOSIS — D649 Anemia, unspecified: Secondary | ICD-10-CM | POA: Diagnosis not present

## 2015-02-24 DIAGNOSIS — M2550 Pain in unspecified joint: Secondary | ICD-10-CM | POA: Diagnosis not present

## 2015-02-24 DIAGNOSIS — R600 Localized edema: Secondary | ICD-10-CM | POA: Diagnosis not present

## 2015-02-24 DIAGNOSIS — K5900 Constipation, unspecified: Secondary | ICD-10-CM

## 2015-02-24 DIAGNOSIS — L97919 Non-pressure chronic ulcer of unspecified part of right lower leg with unspecified severity: Secondary | ICD-10-CM

## 2015-02-24 DIAGNOSIS — L97929 Non-pressure chronic ulcer of unspecified part of left lower leg with unspecified severity: Secondary | ICD-10-CM

## 2015-02-24 DIAGNOSIS — I83029 Varicose veins of left lower extremity with ulcer of unspecified site: Secondary | ICD-10-CM

## 2015-02-24 LAB — FACTOR 5 LEIDEN

## 2015-03-03 ENCOUNTER — Encounter: Payer: Self-pay | Admitting: Internal Medicine

## 2015-03-03 ENCOUNTER — Non-Acute Institutional Stay (SKILLED_NURSING_FACILITY): Payer: Medicare Other | Admitting: Internal Medicine

## 2015-03-03 DIAGNOSIS — I951 Orthostatic hypotension: Secondary | ICD-10-CM | POA: Diagnosis not present

## 2015-03-03 DIAGNOSIS — L309 Dermatitis, unspecified: Secondary | ICD-10-CM | POA: Diagnosis not present

## 2015-03-03 DIAGNOSIS — S91302A Unspecified open wound, left foot, initial encounter: Secondary | ICD-10-CM | POA: Diagnosis not present

## 2015-03-03 DIAGNOSIS — R55 Syncope and collapse: Secondary | ICD-10-CM

## 2015-03-03 DIAGNOSIS — I8310 Varicose veins of unspecified lower extremity with inflammation: Secondary | ICD-10-CM | POA: Diagnosis not present

## 2015-03-03 NOTE — Progress Notes (Signed)
Patient ID: Kurt Sexton, male   DOB: [birth year], 74 y.o.   MRN: [id number]    DATE:  Location:  [facility]    Place of Service: SNF (31)   Extended Emergency Contact Information Primary Emergency Contact: Mccaffrey,Patricia Address: [address]          [place], [place] [address] [place] Phone: [phone] Work Phone: [phone] Relation: Spouse  Advanced Directive information Does patient have an advance directive?: Yes, Type of Advance Directive: Out of facility DNR (pink MOST or yellow form)  Chief Complaint  Patient presents with  . Acute Visit    HPI:  74 yo male short term resident seen today for eval of syncope. The episode occurred while having PT this AM. BP 110/55 after ambulating with PT and prior to that, it was 150s/50-60s. He has had intermittent dizziness. Currently on meclizine. No HA. No CP, SOB. He is taking diuretic for edema. Wife is c/a low BP and left hel wound. Concerned that legs are being wrapped but not debrided by provider. Wound care nurse in facility caring for legs.  wife c/a possible infection in his left heel. 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   Orthostatic VS for the past 24 hrs:  BP- Lying Pulse- Lying BP- Sitting Pulse- Sitting BP- Standing at 0 minutes Pulse- Standing at 0 minutes  03/03/15 2239 (!) 89/62 mmHg 76 194/82 mmHg 77 (!) 63/50 mmHg 87     Past Medical History  Diagnosis Date  . HTN (hypertension)   . Osteoarthritis   . BPH (benign prostatic hyperplasia)     Dr [name]  . CVA (cerebral infarction) 1998  . Vitamin D deficiency   . LBP (low back pain) 2010  . Gout   . Stroke ([place]) 1998  . High cholesterol   . Prostate cancer ([place]) 01/2008    gleason 3+4=7, PSA 4.93, vol 34.18 cc    Past Surgical History  Procedure Laterality Date  . Tonsillectomy    . Hernia repair      x2  . Knee  arthroscopy w/ pcl and lcl  repair & tendon graft    . Correction hammer toe    . Prostate biopsy  01/2008    gleason 7  . Prostate biopsy  03/27/13    gleason 3+4=7, volume 34.18 cc  . Tonsillectomy    . Anterior cervical decomp/discectomy fusion N/A 11/22/2014    Procedure: Cervical Six Corpectomy with graft and plating Cervical Five-Seven;  Surgeon: Erline Levine, MD;  Location: [facility];  Service: Neurosurgery;  Laterality: N/A;    Patient Care Team: [name], MD as PCP - General Milus Banister, MD (Gastroenterology) Justice Britain, MD (Orthopedic Surgery) Lowella Bandy, MD as Consulting Physician (Urology) Rolm Bookbinder, MD as Consulting Physician (Dermatology)  Social History   Social History  . Marital Status: Married    Spouse Name: N/A  . Number of Children: 2  . Years of Education: N/A   Occupational History  . Retired Astronomer [employer]   Social History Main Topics  . Smoking status: Never Smoker   . Smokeless tobacco: Never Used  . Alcohol Use: No  . Drug Use: No  . Sexual Activity: Yes   Other Topics Concern  . Not on file   Social History Narrative   Regular Exercise -  YES           reports that he has never smoked. He has never used smokeless tobacco. He reports that he does not drink alcohol or use illicit drugs.  Immunization History  Administered Date(s) Administered  . Influenza Split 12/07/2010, 12/07/2011  . Influenza Whole 12/25/2008, 12/18/2009  . Influenza,inj,Quad PF,36+ Mos 12/04/2012, 12/05/2013, 01/02/2015  . Pneumococcal Polysaccharide-23 05/02/2008  . Td 03/20/2009  . Zoster 03/27/2014    Allergies  Allergen Reactions  . Diazepam Other (See Comments)    lethargic  . Amlodipine Besylate Other (See Comments)    dizzy  . Cozaar     Severe headache  . Doxazosin Mesylate Other (See Comments)    dizzy  . Lisinopril Other (See Comments)    headache  . Ramipril Other (See Comments)    headache  . Spironolactone Other (See  Comments)    High potassium  . Bactrim [Sulfamethoxazole-Trimethoprim] Rash    Medications: Patient's Medications  New Prescriptions   No medications on file  Previous Medications   ASPIRIN 81 MG EC TABLET    Take 81 mg by mouth daily.     ATORVASTATIN (LIPITOR) 10 MG TABLET    Take 1 tablet (10 mg total) by mouth daily.   BETAMETHASONE DIPROPIONATE (DIPROLENE) 0.05 % CREAM    Apply 1 application topically 2 (two) times daily. Apply to hands, feet, ankles, buttocks and thighs   BISACODYL (DULCOLAX) 10 MG SUPPOSITORY    If no relief by MOM give 10 mg Bisacodyl suppository rectally X 1 dose in  24 hours as needed   HYDROXYZINE (ATARAX/VISTARIL) 25 MG TABLET    Take 1 tablet (25 mg total) by mouth every 8 (eight) hours as needed for itching or nausea.   LABETALOL (NORMODYNE) 200 MG TABLET    Take 1 tablet (200 mg total) by mouth 3 (three) times daily.   MAGNESIUM HYDROXIDE (MILK OF MAGNESIA PO)    Take by mouth. If no BM in 3 days give 30 cc milk of magnesium p.o. 1 dose in 24 hours as needed   MECLIZINE (ANTIVERT) 12.5 MG TABLET    Take 12.5 mg by mouth every 6 (six) hours as needed for dizziness.   METHOCARBAMOL (ROBAXIN) 750 MG TABLET    Take 750 mg by mouth every 6 (six) hours as needed for muscle spasms.   OXYCODONE-ACETAMINOPHEN (PERCOCET/ROXICET) 5-325 MG TABLET    Take 1-2 tablets by mouth every 4 (four) hours as needed for moderate pain.   SENNA PO    Take by mouth.   TRIAMCINOLONE OINTMENT (KENALOG) 0.1 %    Apply 1 application topically 2 (two) times daily as needed (skin irritation).   Modified Medications   No medications on file  Discontinued Medications   METHOCARBAMOL (ROBAXIN) 500 MG TABLET    Take 1.5 tablets (750 mg total) by mouth every 6 (six) hours as needed for muscle spasms.    Review of Systems  Musculoskeletal: Positive for joint swelling and arthralgias.  Skin: Positive for rash and wound.  Neurological: Positive for dizziness and syncope. Negative for numbness.   All other systems reviewed and are negative.   Filed Vitals:   03/03/15 1430 03/03/15 1623 03/03/15 1624 03/03/15 1626  BP: 152/60 89/62 194/82 63/50  Pulse: 60     Temp: 99.2 F (37.3 C)     TempSrc: Oral     Resp: 118     Weight: 229 lb 12.8 oz (104.237 kg)      Body  mass index is 29.9 kg/(m^2).  Physical Exam  Constitutional: He appears well-developed and well-nourished.  HENT:  Mouth/Throat: Oropharynx is clear and moist.  Eyes: Pupils are equal, round, and reactive to light. No scleral icterus.  Neck: Neck supple. Carotid bruit is not present.  Cardiovascular: Normal rate, regular rhythm and intact distal pulses.  Exam reveals no gallop and no friction rub.   Murmur (1/6 SEM) heard. Trace LE edema b/l. B/l LE dsg /d/i. No calf TTp  Pulmonary/Chest: Effort normal and breath sounds normal. He has no wheezes. He has no rales. He exhibits no tenderness.  Abdominal: Soft. Bowel sounds are normal. He exhibits no distension, no abdominal bruit, no pulsatile midline mass and no mass. There is no tenderness. There is no rebound and no guarding.  Musculoskeletal: He exhibits edema and tenderness.  Lymphadenopathy:    He has no cervical adenopathy.  Neurological: He is alert.  Skin: Skin is warm and dry. Rash (b/l toes scaling, dry. hypertrophic changes noted. no d/c. change most noticeable on right 1st toe) noted.  Psychiatric: He has a normal mood and affect. His behavior is normal. Thought content normal.  Vitals reviewed.    Labs reviewed: Admission on 02/18/2015, Discharged on 02/20/2015  Component Date Value Ref Range Status  . Glucose-Capillary 02/18/2015 118* 65 - 99 mg/dL Final  . Comment 1 02/18/2015 Notify RN   Final  . Sodium 02/18/2015 137  135 - 145 mmol/L Final  . Potassium 02/18/2015 3.8  3.5 - 5.1 mmol/L Final  . Chloride 02/18/2015 104  101 - 111 mmol/L Final  . CO2 02/18/2015 27  22 - 32 mmol/L Final  . Glucose, Bld 02/18/2015 114* 65 - 99 mg/dL Final  .  BUN 02/18/2015 16  6 - 20 mg/dL Final  . Creatinine, Ser 02/18/2015 1.29* 0.61 - 1.24 mg/dL Final  . Calcium 02/18/2015 8.7* 8.9 - 10.3 mg/dL Final  . GFR calc non Af Amer 02/18/2015 53* >60 mL/min Final  . GFR calc Af Amer 02/18/2015 >60  >60 mL/min Final   Comment: (NOTE) The eGFR has been calculated using the CKD EPI equation. This calculation has not been validated in all clinical situations. eGFR's persistently <60 mL/min signify possible Chronic Kidney Disease.   . Anion gap 02/18/2015 6  5 - 15 Final  . WBC 02/18/2015 8.9  4.0 - 10.5 K/uL Final  . RBC 02/18/2015 4.23  4.22 - 5.81 MIL/uL Final  . Hemoglobin 02/18/2015 10.9* 13.0 - 17.0 g/dL Final  . HCT 02/18/2015 34.5* 39.0 - 52.0 % Final  . MCV 02/18/2015 81.6  78.0 - 100.0 fL Final  . MCH 02/18/2015 25.8* 26.0 - 34.0 pg Final  . MCHC 02/18/2015 31.6  30.0 - 36.0 g/dL Final  . RDW 02/18/2015 13.9  11.5 - 15.5 % Final  . Platelets 02/18/2015 243  150 - 400 K/uL Final  . Color, Urine 02/18/2015 YELLOW  YELLOW Final  . APPearance 02/18/2015 CLEAR  CLEAR Final  . Specific Gravity, Urine 02/18/2015 1.023  1.005 - 1.030 Final  . pH 02/18/2015 6.0  5.0 - 8.0 Final  . Glucose, UA 02/18/2015 NEGATIVE  NEGATIVE mg/dL Final  . Hgb urine dipstick 02/18/2015 NEGATIVE  NEGATIVE Final  . Bilirubin Urine 02/18/2015 NEGATIVE  NEGATIVE Final  . Ketones, ur 02/18/2015 NEGATIVE  NEGATIVE mg/dL Final  . Protein, ur 02/18/2015 NEGATIVE  NEGATIVE mg/dL Final  . Nitrite 02/18/2015 NEGATIVE  NEGATIVE Final  . Leukocytes, UA 02/18/2015 NEGATIVE  NEGATIVE Final   MICROSCOPIC NOT  DONE ON URINES WITH NEGATIVE PROTEIN, BLOOD, LEUKOCYTES, NITRITE, OR GLUCOSE <1000 mg/dL.  . Troponin i, poc 02/18/2015 0.02  0.00 - 0.08 ng/mL Final  . Comment 3 02/18/2015          Final   Comment: Due to the release kinetics of cTnI, a negative result within the first hours of the onset of symptoms does not rule out myocardial infarction with certainty. If myocardial  infarction is still suspected, repeat the test at appropriate intervals.   . B Natriuretic Peptide 02/18/2015 159.2* 0.0 - 100.0 pg/mL Final  . WBC 02/19/2015 6.9  4.0 - 10.5 K/uL Final  . RBC 02/19/2015 4.22  4.22 - 5.81 MIL/uL Final  . Hemoglobin 02/19/2015 10.9* 13.0 - 17.0 g/dL Final  . HCT 02/19/2015 34.4* 39.0 - 52.0 % Final  . MCV 02/19/2015 81.5  78.0 - 100.0 fL Final  . MCH 02/19/2015 25.8* 26.0 - 34.0 pg Final  . MCHC 02/19/2015 31.7  30.0 - 36.0 g/dL Final  . RDW 02/19/2015 13.8  11.5 - 15.5 % Final  . Platelets 02/19/2015 227  150 - 400 K/uL Final  . Sodium 02/19/2015 138  135 - 145 mmol/L Final  . Potassium 02/19/2015 3.6  3.5 - 5.1 mmol/L Final  . Chloride 02/19/2015 103  101 - 111 mmol/L Final  . CO2 02/19/2015 28  22 - 32 mmol/L Final  . Glucose, Bld 02/19/2015 113* 65 - 99 mg/dL Final  . BUN 02/19/2015 16  6 - 20 mg/dL Final  . Creatinine, Ser 02/19/2015 1.17  0.61 - 1.24 mg/dL Final  . Calcium 02/19/2015 8.7* 8.9 - 10.3 mg/dL Final  . Total Protein 02/19/2015 7.0  6.5 - 8.1 g/dL Final  . Albumin 02/19/2015 3.7  3.5 - 5.0 g/dL Final  . AST 02/19/2015 26  15 - 41 U/L Final  . ALT 02/19/2015 24  17 - 63 U/L Final  . Alkaline Phosphatase 02/19/2015 104  38 - 126 U/L Final  . Total Bilirubin 02/19/2015 1.1  0.3 - 1.2 mg/dL Final  . GFR calc non Af Amer 02/19/2015 59* >60 mL/min Final  . GFR calc Af Amer 02/19/2015 >60  >60 mL/min Final   Comment: (NOTE) The eGFR has been calculated using the CKD EPI equation. This calculation has not been validated in all clinical situations. eGFR's persistently <60 mL/min signify possible Chronic Kidney Disease.   . Anion gap 02/19/2015 7  5 - 15 Final  . Iron 02/19/2015 16* 45 - 182 ug/dL Final  . TIBC 02/19/2015 322  250 - 450 ug/dL Final  . Saturation Ratios 02/19/2015 5* 17.9 - 39.5 % Final  . UIBC 02/19/2015 306   Final   Performed at [facility]  . Ferritin 02/19/2015 74  24 - 336 ng/mL Final   Performed at [facility] 02/19/2015 1.3  0.4 - 3.1 % Final  . RBC. 02/19/2015 4.27  4.22 - 5.81 MIL/uL Final  . Retic Count, Manual 02/19/2015 55.5  19.0 - 186.0 K/uL Final  . Vitamin B-12 02/19/2015 462  180 - 914 pg/mL Final   Comment: (NOTE) This assay is not validated for testing neonatal or myeloproliferative syndrome specimens for Vitamin B12 levels. Performed at [facility]   . Folate 02/19/2015 13.2  >5.9 ng/mL Final   Performed at [facility]  . Magnesium 02/19/2015 2.1  1.7 - 2.4 mg/dL Final  . Recommendations-F5LEID: 02/19/2015 Comment   Final   Comment: (NOTE) Result:  Negative (no mutation found) Factor V Leiden is a specific mutation ([id number]) in the factor V gene that is associated with an increased risk of venous thrombosis. Factor V Leiden is more resistant to inactivation by activated protein C.  As a result, factor V persists in the circulation leading to a mild hyper- coagulable state.  The Leiden mutation accounts for 90% - 95% of APC resistance.  Factor V Leiden has been reported in patients with deep vein thrombosis, pulmonary embolus, central retinal vein occlusion, cerebral sinus thrombosis and hepatic vein thrombosis. Other risk factors to be considered in the workup for venous thrombosis include the [id number] mutation in the factor II (prothrombin) gene, protein S and C deficiency, and antithrombin deficiencies. Anticardiolipin antibody and lupus anticoagulant analysis may be appropriate for certain patients, as well as homocysteine levels. Contact your local [facility] for information on how to order additi                          onal testing if desired.   . Comment 02/19/2015 Comment   Corrected   Comment: (NOTE) **Genetic counselors are available for health care providers to**  discuss results at [phone]). Methodology: DNA analysis of the Factor V gene was performed by allele-specific PCR. The diagnostic sensitivity  and specificity is >99% for both. Molecular-based testing is highly accurate, but as in any laboratory test, diagnostic errors may occur. All test results must be combined with clinical information for the most accurate interpretation. References: Voelkerding K (1996).  Clin Lab Med [id number]. Allison Quarry, PhD, [facility] Ruben Reason, PhD, [facility] Jens Som, PhD, [facility] Annetta Maw, M.S., PhD, [facility] Alfredo Bach, PhD, [facility] Norva Riffle, PhD, [facility] Earlean Polka PhD, [facility] Performed At: [facility] [place], [place] [id number] Nechama Guard MD [id number]   . Protein C Activity 02/19/2015 111  73 - 180 % Final   Comment: (NOTE) Performed At: [facility] [place], [place] [id number] Lindon Romp MD [id number]   . Protein S Activity 02/19/2015 88  63 - 140 % Final   Comment: (NOTE) Protein S activity may be falsely increased (masking an abnormal, low result) in patients receiving direct Xa inhibitor (e.g., rivaroxaban, apixaban, edoxaban) or a direct thrombin inhibitor (e.g., dabigatran) anticoagulant treatment due to assay interference by these drugs. Performed At: [facility] [place], [place] [id number] Lindon Romp MD [id number]   . Protein S Ag, Total 02/19/2015 111  60 - 150 % Final   Comment: (NOTE) Performed At: [facility] [place], [place] [id number] Lindon Romp MD [id number]   . Protein C, Total 02/19/2015 72  60 - 150 % Final   Comment: (NOTE) Performed At: [facility] [place], [place] [id number] Lindon Romp MD [id number]   . WBC 02/20/2015 7.8  4.0 - 10.5 K/uL Final  . RBC 02/20/2015 3.66* 4.22 - 5.81 MIL/uL Final  . Hemoglobin 02/20/2015 9.7* 13.0 - 17.0 g/dL Final  . HCT 02/20/2015 30.2* 39.0 - 52.0 % Final  . MCV 02/20/2015 82.5  78.0 - 100.0 fL Final  . MCH 02/20/2015 26.5  26.0 -  34.0 pg Final  . MCHC 02/20/2015 32.1  30.0 - 36.0 g/dL Final  . RDW 02/20/2015 13.9  11.5 - 15.5 % Final  . Platelets 02/20/2015 213  150 - 400 K/uL Final  . Sodium 02/20/2015 141  135 -  145 mmol/L Final  . Potassium 02/20/2015 4.4  3.5 - 5.1 mmol/L Final   Comment: DELTA CHECK NOTED REPEATED TO VERIFY NO VISIBLE HEMOLYSIS   . Chloride 02/20/2015 106  101 - 111 mmol/L Final  . CO2 02/20/2015 28  22 - 32 mmol/L Final  . Glucose, Bld 02/20/2015 119* 65 - 99 mg/dL Final  . BUN 02/20/2015 17  6 - 20 mg/dL Final  . Creatinine, Ser 02/20/2015 1.19  0.61 - 1.24 mg/dL Final  . Calcium 02/20/2015 8.4* 8.9 - 10.3 mg/dL Final  . GFR calc non Af Amer 02/20/2015 58* >60 mL/min Final  . GFR calc Af Amer 02/20/2015 >60  >60 mL/min Final   Comment: (NOTE) The eGFR has been calculated using the CKD EPI equation. This calculation has not been validated in all clinical situations. eGFR's persistently <60 mL/min signify possible Chronic Kidney Disease.   . Anion gap 02/20/2015 7  5 - 15 Final    Dg Chest 2 View  02/18/2015  CLINICAL DATA:  Leg edema EXAM: CHEST  2 VIEW COMPARISON:  10/24/2014 FINDINGS: Normal heart size and mediastinal contours. No acute infiltrate or edema. No effusion or pneumothorax. No acute osseous findings. IMPRESSION: No active cardiopulmonary disease. No cardiomegaly or pulmonary edema. Electronically Signed   By: Monte Fantasia M.D.   On: 02/18/2015 18:09   Dg Tibia/fibula Left  02/18/2015  CLINICAL DATA:  Leg pain and swelling. EXAM: LEFT TIBIA AND FIBULA - 2 VIEW COMPARISON:  None. FINDINGS: Diffuse subcutaneous reticulation visualize lower extremity, greatest at the calf. There is no soft tissue gas or opaque foreign body. Knee osteoarthritis with diffuse marginal spurring. There is a moderate suprapatellar joint effusion. High appearance of the patella but preserved patellar shadow. No acute fracture.  No focal bone lesion. IMPRESSION: 1. Soft tissue swelling without gas  or opaque foreign body. No acute osseous finding. 2. Tricompartmental knee osteoarthritis with joint effusion. Electronically Signed   By: Monte Fantasia M.D.   On: 02/18/2015 16:37   Dg Tibia/fibula Right  02/18/2015  CLINICAL DATA:  Leg pain and swelling. Generalized pain and weakness. EXAM: RIGHT TIBIA AND FIBULA - 2 VIEW COMPARISON:  Right foot radiographs same day. FINDINGS: There is no acute or healing fracture or suspicious bony lesion. There is a small osteophyte associated with the patella. There are enthesopathic changes at the quadriceps insertion site on the patella. There is degenerative spurring in the medial and lateral compartments of the knee. There is a small knee joint effusion. Diffuse subcutaneous edema is noted in the visualized lower extremity. IMPRESSION: 1. Small knee effusion with mild tricompartmental osteoarthritis of the knee joint. 2. Diffuse subcutaneous edema. 3. No acute bony abnormality. Electronically Signed   By: Curlene Dolphin M.D.   On: 02/18/2015 16:35   Dg Foot Complete Left  02/18/2015  CLINICAL DATA:  Pain and swelling EXAM: LEFT FOOT - COMPLETE 3+ VIEW COMPARISON:  None. FINDINGS: Prior bunion repair. Two pins in the distal first metatarsal. Extensive degenerative change in the first MTP joint. Wires in the first proximal phalanx. Hammertoe deformities fourth through fifth digits. Negative for fracture.  No acute bony change Calcaneal spurring. IMPRESSION: No acute abnormality.  Chronic changes as above. Electronically Signed   By: Franchot Gallo M.D.   On: 02/18/2015 16:34   Dg Foot Complete Right  02/18/2015  CLINICAL DATA:  Leg pain and swelling EXAM: RIGHT FOOT COMPLETE - 3+ VIEW COMPARISON:  07/03/2014 FINDINGS: Three views of the right foot submitted. Hallux  valgus deformity again noted. Again noted degenerative erosive changes first metatarsal phalangeal joint. Small plantar spur of calcaneus again noted. There is soft tissue swelling dorsal metatarsal  region. Mild dorsal spurring tarsal region. IMPRESSION: No acute fracture or subluxation. Hallux valgus deformity. Degenerative changes as described above. Soft tissue swelling dorsal metatarsal region. Electronically Signed   By: Lahoma Crocker M.D.   On: 02/18/2015 16:36     Assessment/Plan   ICD-9-CM ICD-10-CM   1. Orthostatic hypotension 458.0 I95.1   2. Vasovagal syncope due to #1 780.2 R55   3. Chronic venous stasis dermatitis, unspecified laterality 454.1 I83.10   4. Eczematous dermatitis 692.9 L30.9   5.      Left heel wound, initial encounter  Change positions slowly due to orthostasis. T/c florinef vs midodrine vs stopping diuretic  Refer to wound care clinic for chronic venous stasis dermatitis and left heel wound  No TED stockings due to b/l LE dressings  Check CMP - will modify meds (diuretics) as needed  Cont current meds as ordered  Will follow  Laketia Vicknair S. Perlie Gold  [facility] [address] [place], [place] [address] [phone] Cell (Monday-Friday 8 AM - 5 PM) [phone] After 5 PM and follow prompts

## 2015-03-12 ENCOUNTER — Other Ambulatory Visit (INDEPENDENT_AMBULATORY_CARE_PROVIDER_SITE_OTHER): Payer: Medicare Other

## 2015-03-12 ENCOUNTER — Ambulatory Visit (INDEPENDENT_AMBULATORY_CARE_PROVIDER_SITE_OTHER): Payer: Medicare Other | Admitting: Internal Medicine

## 2015-03-12 ENCOUNTER — Encounter: Payer: Self-pay | Admitting: Internal Medicine

## 2015-03-12 VITALS — BP 140/66 | HR 80 | Wt 212.0 lb

## 2015-03-12 DIAGNOSIS — M545 Low back pain: Secondary | ICD-10-CM

## 2015-03-12 DIAGNOSIS — D649 Anemia, unspecified: Secondary | ICD-10-CM

## 2015-03-12 DIAGNOSIS — M79604 Pain in right leg: Secondary | ICD-10-CM

## 2015-03-12 DIAGNOSIS — Z79899 Other long term (current) drug therapy: Secondary | ICD-10-CM

## 2015-03-12 DIAGNOSIS — R609 Edema, unspecified: Secondary | ICD-10-CM

## 2015-03-12 DIAGNOSIS — R202 Paresthesia of skin: Secondary | ICD-10-CM

## 2015-03-12 DIAGNOSIS — R0600 Dyspnea, unspecified: Secondary | ICD-10-CM | POA: Diagnosis not present

## 2015-03-12 DIAGNOSIS — R0603 Acute respiratory distress: Secondary | ICD-10-CM

## 2015-03-12 DIAGNOSIS — E559 Vitamin D deficiency, unspecified: Secondary | ICD-10-CM

## 2015-03-12 DIAGNOSIS — M79605 Pain in left leg: Secondary | ICD-10-CM

## 2015-03-12 LAB — URINALYSIS, ROUTINE W REFLEX MICROSCOPIC
KETONES UR: NEGATIVE
Leukocytes, UA: NEGATIVE
Nitrite: NEGATIVE
Total Protein, Urine: 30 — AB
UROBILINOGEN UA: 1 (ref 0.0–1.0)
Urine Glucose: NEGATIVE
WBC, UA: NONE SEEN (ref 0–?)
pH: 6 (ref 5.0–8.0)

## 2015-03-12 LAB — HEPATIC FUNCTION PANEL
ALT: 17 U/L (ref 0–53)
AST: 15 U/L (ref 0–37)
Albumin: 3.8 g/dL (ref 3.5–5.2)
Alkaline Phosphatase: 182 U/L — ABNORMAL HIGH (ref 39–117)
BILIRUBIN DIRECT: 0.1 mg/dL (ref 0.0–0.3)
BILIRUBIN TOTAL: 0.7 mg/dL (ref 0.2–1.2)
Total Protein: 7.7 g/dL (ref 6.0–8.3)

## 2015-03-12 LAB — CBC WITH DIFFERENTIAL/PLATELET
BASOS ABS: 0 10*3/uL (ref 0.0–0.1)
Basophils Relative: 0.3 % (ref 0.0–3.0)
EOS ABS: 0.9 10*3/uL — AB (ref 0.0–0.7)
Eosinophils Relative: 9.4 % — ABNORMAL HIGH (ref 0.0–5.0)
HEMATOCRIT: 31.6 % — AB (ref 39.0–52.0)
Hemoglobin: 10 g/dL — ABNORMAL LOW (ref 13.0–17.0)
LYMPHS ABS: 0.7 10*3/uL (ref 0.7–4.0)
LYMPHS PCT: 8 % — AB (ref 12.0–46.0)
MCHC: 31.7 g/dL (ref 30.0–36.0)
MCV: 77.7 fl — ABNORMAL LOW (ref 78.0–100.0)
Monocytes Absolute: 0.6 10*3/uL (ref 0.1–1.0)
Monocytes Relative: 6.2 % (ref 3.0–12.0)
NEUTROS ABS: 7 10*3/uL (ref 1.4–7.7)
NEUTROS PCT: 76.1 % (ref 43.0–77.0)
PLATELETS: 380 10*3/uL (ref 150.0–400.0)
RBC: 4.07 Mil/uL — AB (ref 4.22–5.81)
RDW: 15.1 % (ref 11.5–15.5)
WBC: 9.2 10*3/uL (ref 4.0–10.5)

## 2015-03-12 LAB — BASIC METABOLIC PANEL
BUN: 23 mg/dL (ref 6–23)
CHLORIDE: 107 meq/L (ref 96–112)
CO2: 26 meq/L (ref 19–32)
Calcium: 9.7 mg/dL (ref 8.4–10.5)
Creatinine, Ser: 1.08 mg/dL (ref 0.40–1.50)
GFR: 85.6 mL/min (ref >60.00)
Glucose, Bld: 111 mg/dL — ABNORMAL HIGH (ref 70–99)
POTASSIUM: 4.7 meq/L (ref 3.5–5.1)
Sodium: 143 mEq/L (ref 135–145)

## 2015-03-12 LAB — VITAMIN B12: Vitamin B-12: 787 pg/mL (ref 211–911)

## 2015-03-12 LAB — IBC PANEL
IRON: 22 ug/dL — AB (ref 42–165)
Saturation Ratios: 6.5 % — ABNORMAL LOW (ref 20.0–50.0)
TRANSFERRIN: 240 mg/dL (ref 212.0–360.0)

## 2015-03-12 LAB — TSH: TSH: 1.76 u[IU]/mL (ref 0.35–4.50)

## 2015-03-12 LAB — VITAMIN D 25 HYDROXY (VIT D DEFICIENCY, FRACTURES): VITD: 17.73 ng/mL — ABNORMAL LOW (ref 30.00–100.00)

## 2015-03-12 LAB — SEDIMENTATION RATE: Sed Rate: 45 mm/hr — ABNORMAL HIGH (ref 0–22)

## 2015-03-12 MED ORDER — ERGOCALCIFEROL 1.25 MG (50000 UT) PO CAPS: 50000.0000 [IU] | ORAL_CAPSULE | ORAL | Status: DC

## 2015-03-12 MED ORDER — FERROUS SULFATE 325 (65 FE) MG PO TABS: 325.0000 mg | ORAL_TABLET | Freq: Every day | ORAL | Status: DC

## 2015-03-12 MED ORDER — OXYCODONE-ACETAMINOPHEN 10-325 MG PO TABS: 1.0000 | ORAL_TABLET | Freq: Three times a day (TID) | ORAL | Status: DC

## 2015-03-12 MED ORDER — TORSEMIDE 20 MG PO TABS: 20.0000 mg | ORAL_TABLET | Freq: Every day | ORAL | Status: DC

## 2015-03-12 NOTE — Assessment & Plan Note (Signed)
Percocet prn  Potential benefits of a long term opioids use as well as potential risks (i.e. addiction risk, apnea etc) and complications (i.e. Somnolence, constipation and others) were explained to the patient and were aknowledged.  

## 2015-03-12 NOTE — Progress Notes (Signed)
Pre visit review using our clinic review tool, if applicable. No additional management support is needed unless otherwise documented below in the visit note. 

## 2015-03-12 NOTE — Assessment & Plan Note (Signed)
SPEP, UPEP CBC

## 2015-03-12 NOTE — Assessment & Plan Note (Signed)
Severe 12/16 - Demadex

## 2015-03-12 NOTE — Progress Notes (Signed)
Subjective:  Patient ID: Kurt Sexton, male    DOB: 1940-07-31  Age: 74 y.o. MRN: [id number]  CC: No chief complaint on file.   HPI Kurt Sexton presents for post-h[facility] stay  Admit date: 02/18/2015 Discharge date: 02/20/2015  Time spent: 45 minutes  Recommendations for Outpatient Follow-up:  Patient will be discharged to skilled nursing facility. Patient will need to continue physical as well as occupational therapy is recommended by the facility. He will need to have weekly Unna boot changes.. Patient will need to follow up with primary care provider within one week of discharge. Primary care physician should follow-up on patient's anticoagulation workup as well as ABI results. Patient should continue medications as prescribed. Patient should follow a heart healthy diet.   Discharge Diagnoses:  Left knee pain and edema Lower extremity wounds/likely venous insufficiency Osteoarthritis Dyslipidemia Hypertension Normocytic anemia  Discharge Condition: Stable  Diet recommendation: Heart healthy  Filed Weights   02/19/15 0109  Weight: 104.3 kg (229 lb 15 oz)    History of present illness:  on 02/19/2015 by Dr. Gerri Lins Kurt Sexton is a 74 y.o. male with a past medical history hypertension, hyperlipidemia osteoarthritis, BPH, history of prostate cancer on admission, CVA, gout, chronic lower back pain who comes to the emergency department via EMS due to generalized pain, edema and weakness on both legs, but particularly so in his left lower extremity. Per patient, he has had edema on his right lower extremity since April of this year, accompanied by the development of hyperpigmentation and keratosis in his feet and clear fluid oozing wounds. He denies fever, chills, night sweats, but has chronic myalgias, feels very tired and fatigued. He denies dyspnea, chest pain, dizziness, diaphoresis, palpitations, PND or orthopnea.  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, nothing acute -Patient initially placed on full dose Lovenox, will transition to DVT prophylaxis -Anticoagulation workup pending -ABI pending -Suspect patient has chronic venous insufficiency -PT and OT consulted, PT recommended SNF -Echocardiogram [id number], grade 1 diastolic dysfunction -Continue Demadex for edema  Lower extremity wounds -Wound Care consulted and appreciated, recommended Unna boot placement to be changed weekly -Likely secondary to venous insufficiency -Treatment plan as above  Osteoarthritis -Continue pain control  Dyslipidemia -Continue statin  Hypertension -Continue labetalol and Demadex  Normocytic Anemia -Anemia panel: Iron 16, ferritin 74 -Given a dose of Feraheme -Continue to monitor CBC -Baseline hemoglobin 9-10, currently 9.7  Procedures  LE doppler Echocardiogram  Consults  Wound care  Discharge Exam: Filed Vitals:   02/20/15 1004 02/20/15 1331  BP: 153/54 155/59  Pulse: 84 73  Temp: 98.2 F (36.8 C) 98.7 F (37.1 C)  Resp: 18 18   Exam  General: Well developed, well nourished, NAD, appears stated age  HEENT: NCAT,mucous membranes moist.   Cardiovascular: S1 S2 auscultated, no rubs, murmurs or gallops. Regular rate and rhythm.  Respiratory: Clear to auscultation bilaterally with equal chest rise  Abdomen: Soft, nontender, nondistended, + bowel sounds  Extremities: warm dry without cyanosis clubbing. Unna boots in place  Neuro: AAOx3, nonfocal  Psych: Normal affect  and  demeanor  Discharge Instructions      Discharge Instructions    Discharge instructions  Complete by: As directed   Patient will be discharged to skilled nursing facility. Patient will need to continue physical as well as occupational therapy is recommended by the facility. He will need to have weekly Unna boot changes.. Patient will need to follow up with primary care provider within one week of discharge. Primary care physician should follow-up on patient's anticoagulation workup as well as ABI results. Patient should continue medications as prescribed. Patient should follow a heart healthy diet.            Medication List    STOP taking these medications       cimetidine 400 MG tablet  Commonly known as: TAGAMET     gabapentin 300 MG capsule  Commonly known as: NEURONTIN     meclizine 12.5 MG tablet  Commonly known as: ANTIVERT     torsemide 100 MG tablet  Commonly known as: DEMADEX      TAKE these medications       aspirin 81 MG EC tablet  Take 81 mg by mouth daily.     atorvastatin 10 MG tablet  Commonly known as: LIPITOR  Take 1 tablet (10 mg total) by mouth daily.     betamethasone dipropionate 0.05 % cream  Commonly known as: DIPROLENE  Apply 1 application topically 2 (two) times daily. Apply to hands, feet, ankles, buttocks and thighs     hydrOXYzine 25 MG tablet  Commonly known as: ATARAX/VISTARIL  Take 1 tablet (25 mg total) by mouth every 8 (eight) hours as needed for itching or nausea.     labetalol 200 MG tablet  Commonly known as: NORMODYNE  Take 1 tablet (200 mg total) by mouth 3 (three) times daily.     methocarbamol 500 MG tablet  Commonly known as: ROBAXIN  Take 1.5 tablets (750 mg total) by mouth every 6 (six) hours as needed for muscle spasms.     oxyCODONE-acetaminophen 5-325 MG tablet  Commonly known as: PERCOCET/ROXICET  Take 1-2 tablets by mouth  every 4 (four) hours as needed for moderate pain.     triamcinolone ointment 0.1 %  Commonly known as: KENALOG  Apply 1 application topically 2 (two) times daily as needed (skin irritation).       Allergies  Allergen Reactions  . Diazepam Other (See Comments)    lethargic  . Amlodipine Besylate Other (See Comments)    dizzy  . Cozaar     Severe headache  . Doxazosin Mesylate Other (See Comments)    dizzy  . Lisinopril Other (See Comments)    headache  . Ramipril Other (See Comments)    headache  . Spironolactone Other (See Comments)    High potassium  . Bactrim [Sulfamethoxazole-Trimethoprim] Rash   Follow-up Information    Follow up with [name], MD. Schedule an appointment as soon as possible for a visit in 1 week.   Specialty: Internal Medicine   Why: Hospital follow up   Contact information:   [place] [address] [phone]         The results of significant diagnostics from this hospitalization (including imaging, microbiology, ancillary and laboratory) are listed below for reference.    Significant Diagnostic Studies:  Imaging Results    Dg Chest 2 View  02/18/2015 CLINICAL DATA: Leg edema EXAM: CHEST 2 VIEW COMPARISON: 10/24/2014 FINDINGS: Normal heart size and mediastinal contours. No acute  infiltrate or edema. No effusion or pneumothorax. No acute osseous findings. IMPRESSION: No active cardiopulmonary disease. No cardiomegaly or pulmonary edema. Electronically Signed By: Monte Fantasia M.D. On: 02/18/2015 18:09   Dg Tibia/fibula Left  02/18/2015 CLINICAL DATA: Leg pain and swelling. EXAM: LEFT TIBIA AND FIBULA - 2 VIEW COMPARISON: None. FINDINGS: Diffuse subcutaneous reticulation visualize lower extremity, greatest at the calf. There is no soft tissue gas or opaque foreign body. Knee osteoarthritis with diffuse marginal spurring. There is a moderate  suprapatellar joint effusion. High appearance of the patella but preserved patellar shadow. No acute fracture. No focal bone lesion. IMPRESSION: 1. Soft tissue swelling without gas or opaque foreign body. No acute osseous finding. 2. Tricompartmental knee osteoarthritis with joint effusion. Electronically Signed By: Monte Fantasia M.D. On: 02/18/2015 16:37   Dg Tibia/fibula Right  02/18/2015 CLINICAL DATA: Leg pain and swelling. Generalized pain and weakness. EXAM: RIGHT TIBIA AND FIBULA - 2 VIEW COMPARISON: Right foot radiographs same day. FINDINGS: There is no acute or healing fracture or suspicious bony lesion. There is a small osteophyte associated with the patella. There are enthesopathic changes at the quadriceps insertion site on the patella. There is degenerative spurring in the medial and lateral compartments of the knee. There is a small knee joint effusion. Diffuse subcutaneous edema is noted in the visualized lower extremity. IMPRESSION: 1. Small knee effusion with mild tricompartmental osteoarthritis of the knee joint. 2. Diffuse subcutaneous edema. 3. No acute bony abnormality. Electronically Signed By: Curlene Dolphin M.D. On: 02/18/2015 16:35   Dg Foot Complete Left  02/18/2015 CLINICAL DATA: Pain and swelling EXAM: LEFT FOOT - COMPLETE 3+ VIEW COMPARISON: None. FINDINGS: Prior bunion repair. Two pins in the distal first metatarsal. Extensive degenerative change in the first MTP joint. Wires in the first proximal phalanx. Hammertoe deformities fourth through fifth digits. Negative for fracture. No acute bony change Calcaneal spurring. IMPRESSION: No acute abnormality. Chronic changes as above. Electronically Signed By: Franchot Gallo M.D. On: 02/18/2015 16:34   Dg Foot Complete Right  02/18/2015 CLINICAL DATA: Leg pain and swelling EXAM: RIGHT FOOT COMPLETE - 3+ VIEW COMPARISON: 07/03/2014 FINDINGS: Three views of the right foot submitted. Hallux valgus deformity  again noted. Again noted degenerative erosive changes first metatarsal phalangeal joint. Small plantar spur of calcaneus again noted. There is soft tissue swelling dorsal metatarsal region. Mild dorsal spurring tarsal region. IMPRESSION: No acute fracture or subluxation. Hallux valgus deformity. Degenerative changes as described above. Soft tissue swelling dorsal metatarsal region. Electronically Signed By: Lahoma Crocker M.D. On: 02/18/2015 16:36     Microbiology: No results found for this or any previous visit (from the past 240 hour(s)).   Labs: Basic Metabolic Panel:  Last Labs      Recent Labs Lab 02/18/15 1654 02/19/15 0556 02/20/15 0525  NA 137 138 141  K 3.8 3.6 4.4  CL 104 103 106  CO2 27 28 28   GLUCOSE 114* 113* 119*  BUN 16 16 17   CREATININE 1.29* 1.17 1.19  CALCIUM 8.7* 8.7* 8.4*  MG --  2.1 --      Liver Function Tests:  Last Labs      Recent Labs Lab 02/19/15 0556  AST 26  ALT 24  ALKPHOS 104  BILITOT 1.1  PROT 7.0  ALBUMIN 3.7      Last Labs     No results for input(s): LIPASE, AMYLASE in the last 168 hours.    Last Labs     No results for input(s): AMMONIA in the last  168 hours.   CBC:  Last Labs      Recent Labs Lab 02/18/15 1654 02/19/15 0556 02/20/15 0525  WBC 8.9 6.9 7.8  HGB 10.9* 10.9* 9.7*  HCT 34.5* 34.4* 30.2*  MCV 81.6 81.5 82.5  PLT 243 227 213     Cardiac Enzymes:  Last Labs     No results for input(s): CKTOTAL, CKMB, CKMBINDEX, TROPONINI in the last 168 hours.   BNP: BNP (last 3 results)  Recent Labs (within last 365 days)     Recent Labs  02/18/15 1654  BNP 159.2*      ProBNP (last 3 results)  Recent Labs (within last 365 days)    No results for input(s): PROBNP in the last 8760 hours.    CBG:  Last Labs      Recent Labs Lab 02/18/15 1420  GLUCAP 118*         Signed:  [facility] 02/20/2015, 2:18 PM     F/u leg edema, weakness, R knee pain f/u. C/o low BP   Outpatient Prescriptions Prior to Visit  Medication Sig Dispense Refill  . aspirin 81 MG EC tablet Take 81 mg by mouth daily.      Marland Kitchen atorvastatin (LIPITOR) 10 MG tablet Take 1 tablet (10 mg total) by mouth daily. 30 tablet 11  . betamethasone dipropionate (DIPROLENE) 0.05 % cream Apply 1 application topically 2 (two) times daily. Apply to hands, feet, ankles, buttocks and thighs    . bisacodyl (DULCOLAX) 10 MG suppository If no relief by MOM give 10 mg Bisacodyl suppository rectally X 1 dose in  24 hours as needed    . hydrOXYzine (ATARAX/VISTARIL) 25 MG tablet Take 1 tablet (25 mg total) by mouth every 8 (eight) hours as needed for itching or nausea. 60 tablet 1  . labetalol (NORMODYNE) 200 MG tablet Take 1 tablet (200 mg total) by mouth 3 (three) times daily. 180 tablet 3  . Magnesium Hydroxide (MILK OF MAGNESIA PO) Take by mouth. If no BM in 3 days give 30 cc milk of magnesium p.o. 1 dose in 24 hours as needed    . meclizine (ANTIVERT) 12.5 MG tablet Take 12.5 mg by mouth every 6 (six) hours as needed for dizziness.    . triamcinolone ointment (KENALOG) 0.1 % Apply 1 application topically 2 (two) times daily as needed (skin irritation).     . methocarbamol (ROBAXIN) 750 MG tablet Take 750 mg by mouth every 6 (six) hours as needed for muscle spasms.    Marland Kitchen oxyCODONE-acetaminophen (PERCOCET/ROXICET) 5-325 MG tablet Take 1-2 tablets by mouth every 4 (four) hours as needed for moderate pain. 30 tablet 0   No facility-administered medications prior to visit.    ROS Review of Systems  Constitutional: Positive for fatigue and unexpected weight change. Negative for appetite change.  HENT: Negative for congestion, nosebleeds, sneezing, sore throat and trouble swallowing.   Eyes: Negative for itching and visual disturbance.  Respiratory: Positive for shortness of breath. Negative for cough.     Cardiovascular: Positive for leg swelling. Negative for chest pain and palpitations.  Gastrointestinal: Negative for nausea, diarrhea, blood in stool and abdominal distention.  Genitourinary: Negative for frequency, hematuria and decreased urine volume.  Musculoskeletal: Positive for back pain, arthralgias and gait problem. Negative for joint swelling and neck pain.  Skin: Negative for rash.  Neurological: Positive for weakness. Negative for dizziness, tremors and speech difficulty.  Psychiatric/Behavioral: Negative for suicidal ideas, sleep disturbance, dysphoric mood and agitation.  The patient is nervous/anxious.     Objective:  BP 140/66 mmHg  Pulse 80  Wt 212 lb (96.163 kg)  SpO2 99%  BP Readings from Last 3 Encounters:  03/12/15 140/66  03/03/15 63/50  02/24/15 133/63    Wt Readings from Last 3 Encounters:  03/12/15 212 lb (96.163 kg)  03/03/15 229 lb 12.8 oz (104.237 kg)  02/19/15 229 lb 15 oz (104.3 kg)    Physical Exam  Constitutional: He is oriented to person, place, and time. He appears well-developed. No distress.  NAD  HENT:  Mouth/Throat: Oropharynx is clear and moist. No oropharyngeal exudate.  Eyes: Conjunctivae are normal. Pupils are equal, round, and reactive to light.  Neck: Normal range of motion. No JVD present. No thyromegaly present.  Cardiovascular: Normal rate, regular rhythm, normal heart sounds and intact distal pulses.  Exam reveals no gallop and no friction rub.   No murmur heard. Pulmonary/Chest: Effort normal and breath sounds normal. No respiratory distress. He has no wheezes. He has no rales. He exhibits no tenderness.  Abdominal: Soft. Bowel sounds are normal. He exhibits no distension and no mass. There is no tenderness. There is no rebound and no guarding.  Musculoskeletal: Normal range of motion. He exhibits edema and tenderness.  Lymphadenopathy:    He has no cervical adenopathy.  Neurological: He is alert and oriented to person, place,  and time. He has normal reflexes. No cranial nerve deficit. He exhibits normal muscle tone. He displays a negative Romberg sign. Coordination and gait normal.  Skin: Skin is warm and dry. Rash noted. No erythema.  Psychiatric: He has a normal mood and affect. His behavior is normal. Judgment and thought content normal.  2+ B edema Walker Weak, ataxic A/o/c LS tender   EKG - no acute changes  Lab Results  Component Value Date   WBC 9.2 03/12/2015   HGB 10.0* 03/12/2015   HCT 31.6* 03/12/2015   PLT 380.0 03/12/2015   GLUCOSE 111* 03/12/2015   CHOL 208* 12/05/2013   TRIG 111.0 12/05/2013   HDL 35.30* 12/05/2013   LDLCALC 151* 12/05/2013   ALT 17 03/12/2015   AST 15 03/12/2015   NA 143 03/12/2015   K 4.7 03/12/2015   CL 107 03/12/2015   CREATININE 1.08 03/12/2015   BUN 23 03/12/2015   CO2 26 03/12/2015   TSH 1.76 03/12/2015   PSA 13.26* 07/10/2014   INR 1.01 10/25/2010   HGBA1C 5.2 03/27/2014    Dg Chest 2 View  02/18/2015  CLINICAL DATA:  Leg edema EXAM: CHEST  2 VIEW COMPARISON:  10/24/2014 FINDINGS: Normal heart size and mediastinal contours. No acute infiltrate or edema. No effusion or pneumothorax. No acute osseous findings. IMPRESSION: No active cardiopulmonary disease. No cardiomegaly or pulmonary edema. Electronically Signed   By: Monte Fantasia M.D.   On: 02/18/2015 18:09   Dg Tibia/fibula Left  02/18/2015  CLINICAL DATA:  Leg pain and swelling. EXAM: LEFT TIBIA AND FIBULA - 2 VIEW COMPARISON:  None. FINDINGS: Diffuse subcutaneous reticulation visualize lower extremity, greatest at the calf. There is no soft tissue gas or opaque foreign body. Knee osteoarthritis with diffuse marginal spurring. There is a moderate suprapatellar joint effusion. High appearance of the patella but preserved patellar shadow. No acute fracture.  No focal bone lesion. IMPRESSION: 1. Soft tissue swelling without gas or opaque foreign body. No acute osseous finding. 2. Tricompartmental knee  osteoarthritis with joint effusion. Electronically Signed   By: Monte Fantasia M.D.   On: 02/18/2015  16:37   Dg Tibia/fibula Right  02/18/2015  CLINICAL DATA:  Leg pain and swelling. Generalized pain and weakness. EXAM: RIGHT TIBIA AND FIBULA - 2 VIEW COMPARISON:  Right foot radiographs same day. FINDINGS: There is no acute or healing fracture or suspicious bony lesion. There is a small osteophyte associated with the patella. There are enthesopathic changes at the quadriceps insertion site on the patella. There is degenerative spurring in the medial and lateral compartments of the knee. There is a small knee joint effusion. Diffuse subcutaneous edema is noted in the visualized lower extremity. IMPRESSION: 1. Small knee effusion with mild tricompartmental osteoarthritis of the knee joint. 2. Diffuse subcutaneous edema. 3. No acute bony abnormality. Electronically Signed   By: Curlene Dolphin M.D.   On: 02/18/2015 16:35   Dg Foot Complete Left  02/18/2015  CLINICAL DATA:  Pain and swelling EXAM: LEFT FOOT - COMPLETE 3+ VIEW COMPARISON:  None. FINDINGS: Prior bunion repair. Two pins in the distal first metatarsal. Extensive degenerative change in the first MTP joint. Wires in the first proximal phalanx. Hammertoe deformities fourth through fifth digits. Negative for fracture.  No acute bony change Calcaneal spurring. IMPRESSION: No acute abnormality.  Chronic changes as above. Electronically Signed   By: Franchot Gallo M.D.   On: 02/18/2015 16:34   Dg Foot Complete Right  02/18/2015  CLINICAL DATA:  Leg pain and swelling EXAM: RIGHT FOOT COMPLETE - 3+ VIEW COMPARISON:  07/03/2014 FINDINGS: Three views of the right foot submitted. Hallux valgus deformity again noted. Again noted degenerative erosive changes first metatarsal phalangeal joint. Small plantar spur of calcaneus again noted. There is soft tissue swelling dorsal metatarsal region. Mild dorsal spurring tarsal region. IMPRESSION: No acute fracture or  subluxation. Hallux valgus deformity. Degenerative changes as described above. Soft tissue swelling dorsal metatarsal region. Electronically Signed   By: Lahoma Crocker M.D.   On: 02/18/2015 16:36    Assessment & Plan:   Diagnoses and all orders for this visit:  Dyspnea -     EKG 12-Lead -     Urinalysis; Future -     TSH; Future -     Vitamin B12; Future -     VITAMIN D 25 Hydroxy (Vit-D Deficiency, Fractures); Future -     CBC with Differential/Platelet; Future -     Basic metabolic panel; Future -     IBC panel; Future -     Hepatic function panel; Future -     Sedimentation rate; Future -     Protein electrophoresis, serum; Future -     Protein Electrophoresis, Urine Rflx.  Anemia, unspecified anemia type -     Urinalysis; Future -     TSH; Future -     Vitamin B12; Future -     VITAMIN D 25 Hydroxy (Vit-D Deficiency, Fractures); Future -     CBC with Differential/Platelet; Future -     Basic metabolic panel; Future -     IBC panel; Future -     Hepatic function panel; Future -     Sedimentation rate; Future -     Protein electrophoresis, serum; Future -     Protein Electrophoresis, Urine Rflx.  Edema, unspecified type -     Urinalysis; Future -     TSH; Future -     Vitamin B12; Future -     VITAMIN D 25 Hydroxy (Vit-D Deficiency, Fractures); Future -     CBC with Differential/Platelet; Future -  Basic metabolic panel; Future -     IBC panel; Future -     Hepatic function panel; Future -     Sedimentation rate; Future -     Protein electrophoresis, serum; Future -     Protein Electrophoresis, Urine Rflx.  LBP radiating to both legs -     Urinalysis; Future -     TSH; Future -     Vitamin B12; Future -     VITAMIN D 25 Hydroxy (Vit-D Deficiency, Fractures); Future -     CBC with Differential/Platelet; Future -     Basic metabolic panel; Future -     IBC panel; Future -     Hepatic function panel; Future -     Sedimentation rate; Future -     Protein  electrophoresis, serum; Future -     Protein Electrophoresis, Urine Rflx.  Vitamin D deficiency -     Urinalysis; Future -     TSH; Future -     Vitamin B12; Future -     VITAMIN D 25 Hydroxy (Vit-D Deficiency, Fractures); Future -     CBC with Differential/Platelet; Future -     Basic metabolic panel; Future -     IBC panel; Future -     Hepatic function panel; Future -     Sedimentation rate; Future -     Protein electrophoresis, serum; Future -     Protein Electrophoresis, Urine Rflx.  Paresthesia -     Urinalysis; Future -     TSH; Future -     Vitamin B12; Future -     VITAMIN D 25 Hydroxy (Vit-D Deficiency, Fractures); Future -     CBC with Differential/Platelet; Future -     Basic metabolic panel; Future -     IBC panel; Future -     Hepatic function panel; Future -     Sedimentation rate; Future -     Protein electrophoresis, serum; Future -     Protein Electrophoresis, Urine Rflx.  Other orders -     oxyCODONE-acetaminophen (PERCOCET) 10-325 MG tablet; Take 1 tablet by mouth 3 (three) times daily. -     torsemide (DEMADEX) 20 MG tablet; Take 1 tablet (20 mg total) by mouth daily. -     ergocalciferol (VITAMIN D2) 50000 UNITS capsule; Take 1 capsule (50,000 Units total) by mouth once a week. -     ferrous sulfate 325 (65 FE) MG tablet; Take 1 tablet (325 mg total) by mouth daily.   I have discontinued Mr. Drotar's oxyCODONE-acetaminophen and methocarbamol. I am also having him start on oxyCODONE-acetaminophen, torsemide, ergocalciferol, and ferrous sulfate. Additionally, I am having him maintain his aspirin, atorvastatin, hydrOXYzine, betamethasone dipropionate, triamcinolone ointment, labetalol, meclizine, Magnesium Hydroxide (MILK OF MAGNESIA PO), and bisacodyl.  Meds ordered this encounter  Medications  . oxyCODONE-acetaminophen (PERCOCET) 10-325 MG tablet    Sig: Take 1 tablet by mouth 3 (three) times daily.    Dispense:  90 tablet    Refill:  0  . torsemide  (DEMADEX) 20 MG tablet    Sig: Take 1 tablet (20 mg total) by mouth daily.    Dispense:  30 tablet    Refill:  2  . ergocalciferol (VITAMIN D2) 50000 UNITS capsule    Sig: Take 1 capsule (50,000 Units total) by mouth once a week.    Dispense:  6 capsule    Refill:  0  . ferrous sulfate 325 (65 FE) MG tablet  Sig: Take 1 tablet (325 mg total) by mouth daily.    Dispense:  30 tablet    Refill:  6   A complex case  Follow-up: Return in about 2 weeks (around 03/26/2015) for a follow-up visit.  [name], MD

## 2015-03-14 DIAGNOSIS — I5030 Unspecified diastolic (congestive) heart failure: Secondary | ICD-10-CM | POA: Diagnosis not present

## 2015-03-14 DIAGNOSIS — I872 Venous insufficiency (chronic) (peripheral): Secondary | ICD-10-CM | POA: Diagnosis not present

## 2015-03-14 DIAGNOSIS — M170 Bilateral primary osteoarthritis of knee: Secondary | ICD-10-CM | POA: Diagnosis not present

## 2015-03-14 DIAGNOSIS — I110 Hypertensive heart disease with heart failure: Secondary | ICD-10-CM | POA: Diagnosis not present

## 2015-03-14 LAB — PROTEIN ELECTROPHORESIS, SERUM
ALBUMIN ELP: 3.6 g/dL — AB (ref 3.8–4.8)
ALPHA-2-GLOBULIN: 0.9 g/dL (ref 0.5–0.9)
Alpha-1-Globulin: 0.5 g/dL — ABNORMAL HIGH (ref 0.2–0.3)
BETA GLOBULIN: 0.5 g/dL (ref 0.4–0.6)
Beta 2: 0.5 g/dL (ref 0.2–0.5)
Gamma Globulin: 1.3 g/dL (ref 0.8–1.7)
Total Protein, Serum Electrophoresis: 7.3 g/dL (ref 6.1–8.1)

## 2015-03-18 DIAGNOSIS — I5030 Unspecified diastolic (congestive) heart failure: Secondary | ICD-10-CM | POA: Diagnosis not present

## 2015-03-18 DIAGNOSIS — M170 Bilateral primary osteoarthritis of knee: Secondary | ICD-10-CM | POA: Diagnosis not present

## 2015-03-18 DIAGNOSIS — I872 Venous insufficiency (chronic) (peripheral): Secondary | ICD-10-CM | POA: Diagnosis not present

## 2015-03-18 DIAGNOSIS — I110 Hypertensive heart disease with heart failure: Secondary | ICD-10-CM | POA: Diagnosis not present

## 2015-03-19 ENCOUNTER — Ambulatory Visit (INDEPENDENT_AMBULATORY_CARE_PROVIDER_SITE_OTHER): Payer: Medicare Other | Admitting: Internal Medicine

## 2015-03-19 ENCOUNTER — Encounter: Payer: Self-pay | Admitting: Internal Medicine

## 2015-03-19 VITALS — BP 120/54 | HR 73 | Temp 98.3°F | Wt 219.0 lb

## 2015-03-19 DIAGNOSIS — I5030 Unspecified diastolic (congestive) heart failure: Secondary | ICD-10-CM | POA: Diagnosis not present

## 2015-03-19 DIAGNOSIS — M170 Bilateral primary osteoarthritis of knee: Secondary | ICD-10-CM | POA: Diagnosis not present

## 2015-03-19 DIAGNOSIS — E43 Unspecified severe protein-calorie malnutrition: Secondary | ICD-10-CM | POA: Diagnosis not present

## 2015-03-19 DIAGNOSIS — L309 Dermatitis, unspecified: Secondary | ICD-10-CM

## 2015-03-19 DIAGNOSIS — I110 Hypertensive heart disease with heart failure: Secondary | ICD-10-CM | POA: Diagnosis not present

## 2015-03-19 DIAGNOSIS — J069 Acute upper respiratory infection, unspecified: Secondary | ICD-10-CM | POA: Diagnosis not present

## 2015-03-19 DIAGNOSIS — C61 Malignant neoplasm of prostate: Secondary | ICD-10-CM

## 2015-03-19 DIAGNOSIS — I872 Venous insufficiency (chronic) (peripheral): Secondary | ICD-10-CM | POA: Diagnosis not present

## 2015-03-19 MED ORDER — LEVOFLOXACIN 500 MG PO TABS: 500.0000 mg | ORAL_TABLET | Freq: Every day | ORAL | Status: DC

## 2015-03-19 NOTE — Assessment & Plan Note (Signed)
12/16 acute bronchitis Levaquin x 7 d

## 2015-03-19 NOTE — Progress Notes (Signed)
Pre visit review using our clinic review tool, if applicable. No additional management support is needed unless otherwise documented below in the visit note. 

## 2015-03-19 NOTE — Progress Notes (Signed)
Subjective:  Patient ID: Kurt Sexton, male    DOB: [birth year]  Age: 74 y.o. MRN: [id number]  CC: No chief complaint on file.   HPI WYNSTON KALAFUT presents for edema of B LEs C/o productive cough - green mucus x week  Outpatient Prescriptions Prior to Visit  Medication Sig Dispense Refill  . aspirin 81 MG EC tablet Take 81 mg by mouth daily.      . betamethasone dipropionate (DIPROLENE) 0.05 % cream Apply 1 application topically 2 (two) times daily. Apply to hands, feet, ankles, buttocks and thighs    . ergocalciferol (VITAMIN D2) 50000 UNITS capsule Take 1 capsule (50,000 Units total) by mouth once a week. 6 capsule 0  . ferrous sulfate 325 (65 FE) MG tablet Take 1 tablet (325 mg total) by mouth daily. 30 tablet 6  . hydrOXYzine (ATARAX/VISTARIL) 25 MG tablet Take 1 tablet (25 mg total) by mouth every 8 (eight) hours as needed for itching or nausea. 60 tablet 1  . labetalol (NORMODYNE) 200 MG tablet Take 1 tablet (200 mg total) by mouth 3 (three) times daily. 180 tablet 3  . Magnesium Hydroxide (MILK OF MAGNESIA PO) Take by mouth. If no BM in 3 days give 30 cc milk of magnesium p.o. 1 dose in 24 hours as needed    . meclizine (ANTIVERT) 12.5 MG tablet Take 12.5 mg by mouth every 6 (six) hours as needed for dizziness.    Marland Kitchen oxyCODONE-acetaminophen (PERCOCET) 10-325 MG tablet Take 1 tablet by mouth 3 (three) times daily. 90 tablet 0  . torsemide (DEMADEX) 20 MG tablet Take 1 tablet (20 mg total) by mouth daily. 30 tablet 2  . triamcinolone ointment (KENALOG) 0.1 % Apply 1 application topically 2 (two) times daily as needed (skin irritation).     Marland Kitchen atorvastatin (LIPITOR) 10 MG tablet Take 1 tablet (10 mg total) by mouth daily. (Patient not taking: Reported on 03/19/2015) 30 tablet 11  . bisacodyl (DULCOLAX) 10 MG suppository Reported on 03/19/2015     No facility-administered medications prior to visit.    ROS Review of Systems  Constitutional: Negative for appetite change, fatigue and  unexpected weight change.  HENT: Negative for congestion, nosebleeds, sneezing, sore throat and trouble swallowing.   Eyes: Negative for itching and visual disturbance.  Respiratory: Positive for cough.   Cardiovascular: Negative for chest pain, palpitations and leg swelling.  Gastrointestinal: Negative for nausea, diarrhea, blood in stool and abdominal distention.  Genitourinary: Negative for frequency and hematuria.  Musculoskeletal: Positive for back pain, arthralgias and gait problem. Negative for joint swelling and neck pain.  Skin: Positive for rash.  Neurological: Positive for weakness. Negative for dizziness, tremors and speech difficulty.  Psychiatric/Behavioral: Negative for sleep disturbance, dysphoric mood and agitation. The patient is not nervous/anxious.     Objective:  BP 120/54 mmHg  Pulse 73  Temp(Src) 98.3 F (36.8 C) (Oral)  Wt 219 lb (99.338 kg)  SpO2 96%  BP Readings from Last 3 Encounters:  03/19/15 120/54  03/12/15 140/66  03/03/15 63/50    Wt Readings from Last 3 Encounters:  03/19/15 219 lb (99.338 kg)  03/12/15 212 lb (96.163 kg)  03/03/15 229 lb 12.8 oz (104.237 kg)    Physical Exam  Constitutional: He is oriented to person, place, and time. He appears well-developed. No distress.  NAD  HENT:  Mouth/Throat: Oropharynx is clear and moist.  Eyes: Conjunctivae are normal. Pupils are equal, round, and reactive to light.  Neck: Normal range of  motion. No JVD present. No thyromegaly present.  Cardiovascular: Normal rate, regular rhythm, normal heart sounds and intact distal pulses.  Exam reveals no gallop and no friction rub.   No murmur heard. Pulmonary/Chest: Effort normal and breath sounds normal. No respiratory distress. He has no wheezes. He has no rales. He exhibits no tenderness.  Abdominal: Soft. Bowel sounds are normal. He exhibits no distension and no mass. There is no tenderness. There is no rebound and no guarding.  Musculoskeletal: Normal  range of motion. He exhibits edema and tenderness.  Lymphadenopathy:    He has no cervical adenopathy.  Neurological: He is alert and oriented to person, place, and time. He has normal reflexes. No cranial nerve deficit. He exhibits normal muscle tone. He displays a negative Romberg sign. Coordination abnormal. Gait normal.  Skin: Skin is warm and dry. Rash noted.  Psychiatric: He has a normal mood and affect. His behavior is normal. Judgment and thought content normal.  2+ B edema  Lab Results  Component Value Date   WBC 9.2 03/12/2015   HGB 10.0* 03/12/2015   HCT 31.6* 03/12/2015   PLT 380.0 03/12/2015   GLUCOSE 111* 03/12/2015   CHOL 208* 12/05/2013   TRIG 111.0 12/05/2013   HDL 35.30* 12/05/2013   LDLCALC 151* 12/05/2013   ALT 17 03/12/2015   AST 15 03/12/2015   NA 143 03/12/2015   K 4.7 03/12/2015   CL 107 03/12/2015   CREATININE 1.08 03/12/2015   BUN 23 03/12/2015   CO2 26 03/12/2015   TSH 1.76 03/12/2015   PSA 13.26* 07/10/2014   INR 1.01 10/25/2010   HGBA1C 5.2 03/27/2014    Dg Chest 2 View  02/18/2015  CLINICAL DATA:  Leg edema EXAM: CHEST  2 VIEW COMPARISON:  10/24/2014 FINDINGS: Normal heart size and mediastinal contours. No acute infiltrate or edema. No effusion or pneumothorax. No acute osseous findings. IMPRESSION: No active cardiopulmonary disease. No cardiomegaly or pulmonary edema. Electronically Signed   By: Monte Fantasia M.D.   On: 02/18/2015 18:09   Dg Tibia/fibula Left  02/18/2015  CLINICAL DATA:  Leg pain and swelling. EXAM: LEFT TIBIA AND FIBULA - 2 VIEW COMPARISON:  None. FINDINGS: Diffuse subcutaneous reticulation visualize lower extremity, greatest at the calf. There is no soft tissue gas or opaque foreign body. Knee osteoarthritis with diffuse marginal spurring. There is a moderate suprapatellar joint effusion. High appearance of the patella but preserved patellar shadow. No acute fracture.  No focal bone lesion. IMPRESSION: 1. Soft tissue swelling  without gas or opaque foreign body. No acute osseous finding. 2. Tricompartmental knee osteoarthritis with joint effusion. Electronically Signed   By: Monte Fantasia M.D.   On: 02/18/2015 16:37   Dg Tibia/fibula Right  02/18/2015  CLINICAL DATA:  Leg pain and swelling. Generalized pain and weakness. EXAM: RIGHT TIBIA AND FIBULA - 2 VIEW COMPARISON:  Right foot radiographs same day. FINDINGS: There is no acute or healing fracture or suspicious bony lesion. There is a small osteophyte associated with the patella. There are enthesopathic changes at the quadriceps insertion site on the patella. There is degenerative spurring in the medial and lateral compartments of the knee. There is a small knee joint effusion. Diffuse subcutaneous edema is noted in the visualized lower extremity. IMPRESSION: 1. Small knee effusion with mild tricompartmental osteoarthritis of the knee joint. 2. Diffuse subcutaneous edema. 3. No acute bony abnormality. Electronically Signed   By: Curlene Dolphin M.D.   On: 02/18/2015 16:35   Dg Foot Complete  Left  02/18/2015  CLINICAL DATA:  Pain and swelling EXAM: LEFT FOOT - COMPLETE 3+ VIEW COMPARISON:  None. FINDINGS: Prior bunion repair. Two pins in the distal first metatarsal. Extensive degenerative change in the first MTP joint. Wires in the first proximal phalanx. Hammertoe deformities fourth through fifth digits. Negative for fracture.  No acute bony change Calcaneal spurring. IMPRESSION: No acute abnormality.  Chronic changes as above. Electronically Signed   By: Franchot Gallo M.D.   On: 02/18/2015 16:34   Dg Foot Complete Right  02/18/2015  CLINICAL DATA:  Leg pain and swelling EXAM: RIGHT FOOT COMPLETE - 3+ VIEW COMPARISON:  07/03/2014 FINDINGS: Three views of the right foot submitted. Hallux valgus deformity again noted. Again noted degenerative erosive changes first metatarsal phalangeal joint. Small plantar spur of calcaneus again noted. There is soft tissue swelling dorsal  metatarsal region. Mild dorsal spurring tarsal region. IMPRESSION: No acute fracture or subluxation. Hallux valgus deformity. Degenerative changes as described above. Soft tissue swelling dorsal metatarsal region. Electronically Signed   By: Lahoma Crocker M.D.   On: 02/18/2015 16:36    Assessment & Plan:   Diagnoses and all orders for this visit:  URI, acute  Other orders -     levofloxacin (LEVAQUIN) 500 MG tablet; Take 1 tablet (500 mg total) by mouth daily.  I am having Mr. Karis start on levofloxacin. I am also having him maintain his aspirin, atorvastatin, hydrOXYzine, betamethasone dipropionate, triamcinolone ointment, labetalol, meclizine, Magnesium Hydroxide (MILK OF MAGNESIA PO), bisacodyl, oxyCODONE-acetaminophen, torsemide, ergocalciferol, and ferrous sulfate.  Meds ordered this encounter  Medications  . levofloxacin (LEVAQUIN) 500 MG tablet    Sig: Take 1 tablet (500 mg total) by mouth daily.    Dispense:  7 tablet    Refill:  0     Follow-up: Return in about 4 weeks (around 04/16/2015) for a follow-up visit.  [name], MD

## 2015-03-20 ENCOUNTER — Telehealth: Payer: Self-pay | Admitting: Internal Medicine

## 2015-03-20 DIAGNOSIS — I5030 Unspecified diastolic (congestive) heart failure: Secondary | ICD-10-CM | POA: Diagnosis not present

## 2015-03-20 DIAGNOSIS — I872 Venous insufficiency (chronic) (peripheral): Secondary | ICD-10-CM | POA: Diagnosis not present

## 2015-03-20 DIAGNOSIS — M170 Bilateral primary osteoarthritis of knee: Secondary | ICD-10-CM | POA: Diagnosis not present

## 2015-03-20 DIAGNOSIS — I110 Hypertensive heart disease with heart failure: Secondary | ICD-10-CM | POA: Diagnosis not present

## 2015-03-20 NOTE — Telephone Encounter (Signed)
Verbal orders given to Camelyn for below.

## 2015-03-20 NOTE — Telephone Encounter (Signed)
Camelyn from [place] is requesting verbal orders for PT  2 x  4 wks She can be reached at [phone]

## 2015-03-21 DIAGNOSIS — I5030 Unspecified diastolic (congestive) heart failure: Secondary | ICD-10-CM | POA: Diagnosis not present

## 2015-03-21 DIAGNOSIS — I872 Venous insufficiency (chronic) (peripheral): Secondary | ICD-10-CM | POA: Diagnosis not present

## 2015-03-21 DIAGNOSIS — I110 Hypertensive heart disease with heart failure: Secondary | ICD-10-CM | POA: Diagnosis not present

## 2015-03-21 DIAGNOSIS — M170 Bilateral primary osteoarthritis of knee: Secondary | ICD-10-CM | POA: Diagnosis not present

## 2015-03-24 ENCOUNTER — Encounter: Payer: Self-pay | Admitting: Internal Medicine

## 2015-03-24 DIAGNOSIS — M170 Bilateral primary osteoarthritis of knee: Secondary | ICD-10-CM | POA: Diagnosis not present

## 2015-03-24 DIAGNOSIS — I110 Hypertensive heart disease with heart failure: Secondary | ICD-10-CM | POA: Diagnosis not present

## 2015-03-24 DIAGNOSIS — I872 Venous insufficiency (chronic) (peripheral): Secondary | ICD-10-CM | POA: Diagnosis not present

## 2015-03-24 DIAGNOSIS — I5030 Unspecified diastolic (congestive) heart failure: Secondary | ICD-10-CM | POA: Diagnosis not present

## 2015-03-24 NOTE — Assessment & Plan Note (Signed)
On MTX po

## 2015-03-24 NOTE — Assessment & Plan Note (Signed)
F/u w/Urology 

## 2015-03-24 NOTE — Assessment & Plan Note (Signed)
Demadex po

## 2015-03-25 DIAGNOSIS — I110 Hypertensive heart disease with heart failure: Secondary | ICD-10-CM | POA: Diagnosis not present

## 2015-03-25 DIAGNOSIS — I5030 Unspecified diastolic (congestive) heart failure: Secondary | ICD-10-CM | POA: Diagnosis not present

## 2015-03-25 DIAGNOSIS — M170 Bilateral primary osteoarthritis of knee: Secondary | ICD-10-CM | POA: Diagnosis not present

## 2015-03-25 DIAGNOSIS — I872 Venous insufficiency (chronic) (peripheral): Secondary | ICD-10-CM | POA: Diagnosis not present

## 2015-03-26 DIAGNOSIS — I5030 Unspecified diastolic (congestive) heart failure: Secondary | ICD-10-CM | POA: Diagnosis not present

## 2015-03-26 DIAGNOSIS — I110 Hypertensive heart disease with heart failure: Secondary | ICD-10-CM | POA: Diagnosis not present

## 2015-03-26 DIAGNOSIS — M170 Bilateral primary osteoarthritis of knee: Secondary | ICD-10-CM | POA: Diagnosis not present

## 2015-03-26 DIAGNOSIS — I872 Venous insufficiency (chronic) (peripheral): Secondary | ICD-10-CM | POA: Diagnosis not present

## 2015-03-27 DIAGNOSIS — I872 Venous insufficiency (chronic) (peripheral): Secondary | ICD-10-CM | POA: Diagnosis not present

## 2015-03-27 DIAGNOSIS — I110 Hypertensive heart disease with heart failure: Secondary | ICD-10-CM | POA: Diagnosis not present

## 2015-03-27 DIAGNOSIS — M170 Bilateral primary osteoarthritis of knee: Secondary | ICD-10-CM | POA: Diagnosis not present

## 2015-03-27 DIAGNOSIS — I5030 Unspecified diastolic (congestive) heart failure: Secondary | ICD-10-CM | POA: Diagnosis not present

## 2015-03-28 DIAGNOSIS — I110 Hypertensive heart disease with heart failure: Secondary | ICD-10-CM | POA: Diagnosis not present

## 2015-03-28 DIAGNOSIS — I872 Venous insufficiency (chronic) (peripheral): Secondary | ICD-10-CM | POA: Diagnosis not present

## 2015-03-28 DIAGNOSIS — I5030 Unspecified diastolic (congestive) heart failure: Secondary | ICD-10-CM | POA: Diagnosis not present

## 2015-03-28 DIAGNOSIS — M170 Bilateral primary osteoarthritis of knee: Secondary | ICD-10-CM | POA: Diagnosis not present

## 2015-04-01 DIAGNOSIS — M170 Bilateral primary osteoarthritis of knee: Secondary | ICD-10-CM | POA: Diagnosis not present

## 2015-04-01 DIAGNOSIS — I5030 Unspecified diastolic (congestive) heart failure: Secondary | ICD-10-CM | POA: Diagnosis not present

## 2015-04-01 DIAGNOSIS — I110 Hypertensive heart disease with heart failure: Secondary | ICD-10-CM | POA: Diagnosis not present

## 2015-04-01 DIAGNOSIS — I872 Venous insufficiency (chronic) (peripheral): Secondary | ICD-10-CM | POA: Diagnosis not present

## 2015-04-02 DIAGNOSIS — I110 Hypertensive heart disease with heart failure: Secondary | ICD-10-CM | POA: Diagnosis not present

## 2015-04-02 DIAGNOSIS — I5030 Unspecified diastolic (congestive) heart failure: Secondary | ICD-10-CM | POA: Diagnosis not present

## 2015-04-02 DIAGNOSIS — M170 Bilateral primary osteoarthritis of knee: Secondary | ICD-10-CM | POA: Diagnosis not present

## 2015-04-02 DIAGNOSIS — I872 Venous insufficiency (chronic) (peripheral): Secondary | ICD-10-CM | POA: Diagnosis not present

## 2015-04-03 DIAGNOSIS — I5030 Unspecified diastolic (congestive) heart failure: Secondary | ICD-10-CM | POA: Diagnosis not present

## 2015-04-03 DIAGNOSIS — M170 Bilateral primary osteoarthritis of knee: Secondary | ICD-10-CM | POA: Diagnosis not present

## 2015-04-03 DIAGNOSIS — I110 Hypertensive heart disease with heart failure: Secondary | ICD-10-CM | POA: Diagnosis not present

## 2015-04-03 DIAGNOSIS — I872 Venous insufficiency (chronic) (peripheral): Secondary | ICD-10-CM | POA: Diagnosis not present

## 2015-04-04 DIAGNOSIS — I110 Hypertensive heart disease with heart failure: Secondary | ICD-10-CM | POA: Diagnosis not present

## 2015-04-04 DIAGNOSIS — I5030 Unspecified diastolic (congestive) heart failure: Secondary | ICD-10-CM | POA: Diagnosis not present

## 2015-04-04 DIAGNOSIS — I872 Venous insufficiency (chronic) (peripheral): Secondary | ICD-10-CM | POA: Diagnosis not present

## 2015-04-04 DIAGNOSIS — M170 Bilateral primary osteoarthritis of knee: Secondary | ICD-10-CM | POA: Diagnosis not present

## 2015-04-07 DIAGNOSIS — M170 Bilateral primary osteoarthritis of knee: Secondary | ICD-10-CM | POA: Diagnosis not present

## 2015-04-07 DIAGNOSIS — I872 Venous insufficiency (chronic) (peripheral): Secondary | ICD-10-CM | POA: Diagnosis not present

## 2015-04-07 DIAGNOSIS — I110 Hypertensive heart disease with heart failure: Secondary | ICD-10-CM | POA: Diagnosis not present

## 2015-04-07 DIAGNOSIS — I5030 Unspecified diastolic (congestive) heart failure: Secondary | ICD-10-CM | POA: Diagnosis not present

## 2015-04-08 ENCOUNTER — Telehealth: Payer: Self-pay | Admitting: Internal Medicine

## 2015-04-08 DIAGNOSIS — I110 Hypertensive heart disease with heart failure: Secondary | ICD-10-CM | POA: Diagnosis not present

## 2015-04-08 DIAGNOSIS — M170 Bilateral primary osteoarthritis of knee: Secondary | ICD-10-CM | POA: Diagnosis not present

## 2015-04-08 DIAGNOSIS — I5030 Unspecified diastolic (congestive) heart failure: Secondary | ICD-10-CM | POA: Diagnosis not present

## 2015-04-08 DIAGNOSIS — I872 Venous insufficiency (chronic) (peripheral): Secondary | ICD-10-CM | POA: Diagnosis not present

## 2015-04-08 DIAGNOSIS — M545 Low back pain: Secondary | ICD-10-CM | POA: Diagnosis not present

## 2015-04-08 NOTE — Telephone Encounter (Signed)
Is calling to get verbal order to extend PT for 2 times a week for two more weeks starting next week.

## 2015-04-09 DIAGNOSIS — I110 Hypertensive heart disease with heart failure: Secondary | ICD-10-CM | POA: Diagnosis not present

## 2015-04-09 DIAGNOSIS — I872 Venous insufficiency (chronic) (peripheral): Secondary | ICD-10-CM | POA: Diagnosis not present

## 2015-04-09 DIAGNOSIS — I5030 Unspecified diastolic (congestive) heart failure: Secondary | ICD-10-CM | POA: Diagnosis not present

## 2015-04-09 DIAGNOSIS — M170 Bilateral primary osteoarthritis of knee: Secondary | ICD-10-CM | POA: Diagnosis not present

## 2015-04-09 NOTE — Telephone Encounter (Signed)
Ok Thx 

## 2015-04-09 NOTE — Telephone Encounter (Signed)
Notified nurse with md response.../lmb 

## 2015-04-10 DIAGNOSIS — I110 Hypertensive heart disease with heart failure: Secondary | ICD-10-CM | POA: Diagnosis not present

## 2015-04-10 DIAGNOSIS — I5030 Unspecified diastolic (congestive) heart failure: Secondary | ICD-10-CM | POA: Diagnosis not present

## 2015-04-10 DIAGNOSIS — M170 Bilateral primary osteoarthritis of knee: Secondary | ICD-10-CM | POA: Diagnosis not present

## 2015-04-10 DIAGNOSIS — I872 Venous insufficiency (chronic) (peripheral): Secondary | ICD-10-CM | POA: Diagnosis not present

## 2015-04-11 DIAGNOSIS — I5030 Unspecified diastolic (congestive) heart failure: Secondary | ICD-10-CM | POA: Diagnosis not present

## 2015-04-11 DIAGNOSIS — M170 Bilateral primary osteoarthritis of knee: Secondary | ICD-10-CM | POA: Diagnosis not present

## 2015-04-11 DIAGNOSIS — I872 Venous insufficiency (chronic) (peripheral): Secondary | ICD-10-CM | POA: Diagnosis not present

## 2015-04-11 DIAGNOSIS — I110 Hypertensive heart disease with heart failure: Secondary | ICD-10-CM | POA: Diagnosis not present

## 2015-04-14 ENCOUNTER — Ambulatory Visit (INDEPENDENT_AMBULATORY_CARE_PROVIDER_SITE_OTHER): Payer: Medicare Other | Admitting: Neurology

## 2015-04-14 ENCOUNTER — Encounter: Payer: Self-pay | Admitting: Neurology

## 2015-04-14 VITALS — BP 160/90 | HR 78 | Wt 217.0 lb

## 2015-04-14 DIAGNOSIS — G609 Hereditary and idiopathic neuropathy, unspecified: Secondary | ICD-10-CM

## 2015-04-14 DIAGNOSIS — R29898 Other symptoms and signs involving the musculoskeletal system: Secondary | ICD-10-CM | POA: Diagnosis not present

## 2015-04-14 DIAGNOSIS — R292 Abnormal reflex: Secondary | ICD-10-CM

## 2015-04-14 DIAGNOSIS — R2681 Unsteadiness on feet: Secondary | ICD-10-CM

## 2015-04-14 DIAGNOSIS — M5000 Cervical disc disorder with myelopathy, unspecified cervical region: Secondary | ICD-10-CM | POA: Diagnosis not present
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
## 2015-04-14 NOTE — Patient Instructions (Signed)
1.  Start MRI lumbar spine without contrast 2.  Start using heat/ice to your neck 3.  Continue physical therapy exercises 4.  Return to clinic in 3-4 months

## 2015-04-15 DIAGNOSIS — I5030 Unspecified diastolic (congestive) heart failure: Secondary | ICD-10-CM | POA: Diagnosis not present

## 2015-04-15 DIAGNOSIS — M170 Bilateral primary osteoarthritis of knee: Secondary | ICD-10-CM | POA: Diagnosis not present

## 2015-04-15 DIAGNOSIS — I872 Venous insufficiency (chronic) (peripheral): Secondary | ICD-10-CM | POA: Diagnosis not present

## 2015-04-15 DIAGNOSIS — I110 Hypertensive heart disease with heart failure: Secondary | ICD-10-CM | POA: Diagnosis not present

## 2015-04-16 ENCOUNTER — Ambulatory Visit (INDEPENDENT_AMBULATORY_CARE_PROVIDER_SITE_OTHER): Payer: Medicare Other | Admitting: Internal Medicine

## 2015-04-16 ENCOUNTER — Encounter (HOSPITAL_COMMUNITY): Payer: Self-pay | Admitting: Emergency Medicine

## 2015-04-16 ENCOUNTER — Observation Stay (HOSPITAL_COMMUNITY)
Admission: EM | Admit: 2015-04-16 | Discharge: 2015-04-17 | Disposition: A | Discharge status: Home / Self Care | Payer: Medicare Other | Attending: Internal Medicine | Admitting: Internal Medicine

## 2015-04-16 ENCOUNTER — Emergency Department (HOSPITAL_COMMUNITY): Payer: Medicare Other

## 2015-04-16 ENCOUNTER — Encounter: Payer: Self-pay | Admitting: Internal Medicine

## 2015-04-16 VITALS — BP 90/60 | HR 62 | Wt 221.0 lb

## 2015-04-16 DIAGNOSIS — M109 Gout, unspecified: Secondary | ICD-10-CM | POA: Insufficient documentation

## 2015-04-16 DIAGNOSIS — M549 Dorsalgia, unspecified: Secondary | ICD-10-CM | POA: Insufficient documentation

## 2015-04-16 DIAGNOSIS — R55 Syncope and collapse: Secondary | ICD-10-CM

## 2015-04-16 DIAGNOSIS — G8929 Other chronic pain: Secondary | ICD-10-CM | POA: Diagnosis not present

## 2015-04-16 DIAGNOSIS — Z8673 Personal history of transient ischemic attack (TIA), and cerebral infarction without residual deficits: Secondary | ICD-10-CM | POA: Insufficient documentation

## 2015-04-16 DIAGNOSIS — D649 Anemia, unspecified: Secondary | ICD-10-CM | POA: Diagnosis present

## 2015-04-16 DIAGNOSIS — R600 Localized edema: Secondary | ICD-10-CM | POA: Insufficient documentation

## 2015-04-16 DIAGNOSIS — R609 Edema, unspecified: Secondary | ICD-10-CM | POA: Diagnosis present

## 2015-04-16 DIAGNOSIS — E7800 Pure hypercholesterolemia, unspecified: Secondary | ICD-10-CM | POA: Diagnosis not present

## 2015-04-16 DIAGNOSIS — I10 Essential (primary) hypertension: Secondary | ICD-10-CM | POA: Insufficient documentation

## 2015-04-16 DIAGNOSIS — K5909 Other constipation: Secondary | ICD-10-CM | POA: Diagnosis present

## 2015-04-16 DIAGNOSIS — L97909 Non-pressure chronic ulcer of unspecified part of unspecified lower leg with unspecified severity: Secondary | ICD-10-CM

## 2015-04-16 DIAGNOSIS — Z8546 Personal history of malignant neoplasm of prostate: Secondary | ICD-10-CM | POA: Diagnosis not present

## 2015-04-16 DIAGNOSIS — E785 Hyperlipidemia, unspecified: Secondary | ICD-10-CM | POA: Diagnosis not present

## 2015-04-16 DIAGNOSIS — I6789 Other cerebrovascular disease: Secondary | ICD-10-CM | POA: Insufficient documentation

## 2015-04-16 DIAGNOSIS — Z888 Allergy status to other drugs, medicaments and biological substances status: Secondary | ICD-10-CM | POA: Insufficient documentation

## 2015-04-16 DIAGNOSIS — R001 Bradycardia, unspecified: Secondary | ICD-10-CM | POA: Insufficient documentation

## 2015-04-16 DIAGNOSIS — I872 Venous insufficiency (chronic) (peripheral): Secondary | ICD-10-CM | POA: Insufficient documentation

## 2015-04-16 DIAGNOSIS — E162 Hypoglycemia, unspecified: Secondary | ICD-10-CM | POA: Diagnosis not present

## 2015-04-16 DIAGNOSIS — E559 Vitamin D deficiency, unspecified: Secondary | ICD-10-CM | POA: Diagnosis not present

## 2015-04-16 DIAGNOSIS — N400 Enlarged prostate without lower urinary tract symptoms: Secondary | ICD-10-CM | POA: Insufficient documentation

## 2015-04-16 DIAGNOSIS — M1990 Unspecified osteoarthritis, unspecified site: Secondary | ICD-10-CM | POA: Insufficient documentation

## 2015-04-16 DIAGNOSIS — I674 Hypertensive encephalopathy: Secondary | ICD-10-CM | POA: Diagnosis present

## 2015-04-16 DIAGNOSIS — Z882 Allergy status to sulfonamides status: Secondary | ICD-10-CM | POA: Diagnosis not present

## 2015-04-16 DIAGNOSIS — R6889 Other general symptoms and signs: Secondary | ICD-10-CM | POA: Diagnosis not present

## 2015-04-16 DIAGNOSIS — I83009 Varicose veins of unspecified lower extremity with ulcer of unspecified site: Secondary | ICD-10-CM | POA: Diagnosis present

## 2015-04-16 HISTORY — DX: Personal history of other diseases of the musculoskeletal system and connective tissue: Z87.39

## 2015-04-16 HISTORY — DX: Heart failure, unspecified: I50.9

## 2015-04-16 HISTORY — DX: Venous insufficiency (chronic) (peripheral): I87.2

## 2015-04-16 HISTORY — DX: Cerebral infarction, unspecified: I63.9

## 2015-04-16 LAB — COMPREHENSIVE METABOLIC PANEL
ALBUMIN: 3 g/dL — AB (ref 3.5–5.0)
ALK PHOS: 118 U/L (ref 38–126)
ALT: 15 U/L — ABNORMAL LOW (ref 17–63)
AST: 20 U/L (ref 15–41)
Anion gap: 7 (ref 5–15)
BILIRUBIN TOTAL: 0.4 mg/dL (ref 0.3–1.2)
BUN: 12 mg/dL (ref 6–20)
CALCIUM: 8.6 mg/dL — AB (ref 8.9–10.3)
CO2: 25 mmol/L (ref 22–32)
Chloride: 107 mmol/L (ref 101–111)
Creatinine, Ser: 1.24 mg/dL (ref 0.61–1.24)
GFR calc Af Amer: >60 mL/min (ref >60)
GFR, EST NON AFRICAN AMERICAN: 55 mL/min — AB (ref >60)
GLUCOSE: 140 mg/dL — AB (ref 65–99)
Potassium: 4.2 mmol/L (ref 3.5–5.1)
Sodium: 139 mmol/L (ref 135–145)
TOTAL PROTEIN: 5.7 g/dL — AB (ref 6.5–8.1)

## 2015-04-16 LAB — CBC WITH DIFFERENTIAL/PLATELET
BASOS ABS: 0.1 10*3/uL (ref 0.0–0.1)
Basophils Relative: 1 %
EOS ABS: 1.7 10*3/uL — AB (ref 0.0–0.7)
Eosinophils Relative: 30 %
HCT: 33 % — ABNORMAL LOW (ref 39.0–52.0)
Hemoglobin: 10.3 g/dL — ABNORMAL LOW (ref 13.0–17.0)
LYMPHS ABS: 1 10*3/uL (ref 0.7–4.0)
Lymphocytes Relative: 18 %
MCH: 25.9 pg — ABNORMAL LOW (ref 26.0–34.0)
MCHC: 31.2 g/dL (ref 30.0–36.0)
MCV: 83.1 fL (ref 78.0–100.0)
MONO ABS: 0.3 10*3/uL (ref 0.1–1.0)
Monocytes Relative: 6 %
NEUTROS ABS: 2.6 10*3/uL (ref 1.7–7.7)
Neutrophils Relative %: 45 %
PLATELETS: 242 10*3/uL (ref 150–400)
RBC: 3.97 MIL/uL — ABNORMAL LOW (ref 4.22–5.81)
RDW: 16.4 % — AB (ref 11.5–15.5)
WBC: 5.7 10*3/uL (ref 4.0–10.5)

## 2015-04-16 LAB — URINE MICROSCOPIC-ADD ON

## 2015-04-16 LAB — URINALYSIS, ROUTINE W REFLEX MICROSCOPIC
BILIRUBIN URINE: NEGATIVE
GLUCOSE, UA: NEGATIVE mg/dL
HGB URINE DIPSTICK: NEGATIVE
KETONES UR: 15 mg/dL — AB
Nitrite: NEGATIVE
PROTEIN: 30 mg/dL — AB
Specific Gravity, Urine: 1.023 (ref 1.005–1.030)
pH: 6.5 (ref 5.0–8.0)

## 2015-04-16 LAB — GLUCOSE, POCT (MANUAL RESULT ENTRY): POC GLUCOSE: 162 mg/dL — AB (ref 70–99)

## 2015-04-16 LAB — TROPONIN I

## 2015-04-16 LAB — BRAIN NATRIURETIC PEPTIDE: B NATRIURETIC PEPTIDE 5: 109.7 pg/mL — AB (ref 0.0–100.0)

## 2015-04-16 MED ORDER — FERROUS SULFATE 325 (65 FE) MG PO TABS
325.0000 mg | ORAL_TABLET | Freq: Every day | ORAL | Status: DC
  Administered 2015-04-17: 325 mg via ORAL
  Filled 2015-04-16: qty 1

## 2015-04-16 MED ORDER — SODIUM CHLORIDE 0.9% FLUSH
3.0000 mL | Freq: Two times a day (BID) | INTRAVENOUS | Status: DC
  Administered 2015-04-16: 3 mL via INTRAVENOUS

## 2015-04-16 MED ORDER — ATORVASTATIN CALCIUM 10 MG PO TABS
10.0000 mg | ORAL_TABLET | Freq: Every day | ORAL | Status: DC
  Administered 2015-04-17: 10 mg via ORAL
  Filled 2015-04-16: qty 1

## 2015-04-16 MED ORDER — HYDROXYZINE HCL 25 MG PO TABS: 25.0000 mg | ORAL_TABLET | Freq: Three times a day (TID) | ORAL | Status: DC | PRN

## 2015-04-16 MED ORDER — LEVOFLOXACIN 500 MG PO TABS: 500.0000 mg | ORAL_TABLET | Freq: Every day | ORAL | Status: DC

## 2015-04-16 MED ORDER — MECLIZINE HCL 12.5 MG PO TABS
12.5000 mg | ORAL_TABLET | Freq: Four times a day (QID) | ORAL | Status: DC | PRN
  Filled 2015-04-16: qty 1

## 2015-04-16 MED ORDER — ONDANSETRON HCL 4 MG/2ML IJ SOLN: 4.0000 mg | Freq: Four times a day (QID) | INTRAMUSCULAR | Status: DC | PRN

## 2015-04-16 MED ORDER — ONDANSETRON HCL 4 MG PO TABS: 4.0000 mg | ORAL_TABLET | Freq: Four times a day (QID) | ORAL | Status: DC | PRN

## 2015-04-16 MED ORDER — ASPIRIN EC 81 MG PO TBEC
81.0000 mg | DELAYED_RELEASE_TABLET | Freq: Every day | ORAL | Status: DC
Start: 2015-04-16 — End: 2015-04-17
  Administered 2015-04-17: 81 mg via ORAL
  Filled 2015-04-16 (×3): qty 1

## 2015-04-16 MED ORDER — ACETAMINOPHEN 325 MG PO TABS: 650.0000 mg | ORAL_TABLET | Freq: Four times a day (QID) | ORAL | Status: DC | PRN

## 2015-04-16 MED ORDER — SODIUM CHLORIDE 0.9 % IV SOLN
INTRAVENOUS | Status: AC
  Administered 2015-04-16: 16:00:00 via INTRAVENOUS

## 2015-04-16 MED ORDER — ENOXAPARIN SODIUM 30 MG/0.3ML ~~LOC~~ SOLN
30.0000 mg | SUBCUTANEOUS | Status: DC
  Administered 2015-04-16: 30 mg via SUBCUTANEOUS
  Filled 2015-04-16: qty 0.3

## 2015-04-16 MED ORDER — ACETAMINOPHEN 650 MG RE SUPP: 650.0000 mg | Freq: Four times a day (QID) | RECTAL | Status: DC | PRN

## 2015-04-16 NOTE — Progress Notes (Signed)
Pre visit review using our clinic review tool, if applicable. No additional management support is needed unless otherwise documented below in the visit note. 

## 2015-04-16 NOTE — Progress Notes (Signed)
Subjective:  Patient ID: Kurt Sexton, male    DOB: April 17, 1940  Age: 75 y.o. MRN: [id number]  CC: No chief complaint on file.   HPI Kurt Sexton presents for LE swelling w/ulcers and HTN f/u. Pt walked in the office w/wife looking and acting normal. He suddenly became weak and incoherent. We were able ti sit him down. He was moaning and trying to answer questions. Became diaphoretic. Unable to get an EKG. Unable to transfer to the table. SBP 80. CBG 160. O2 sat 97% RA. We called ambulance.  Outpatient Prescriptions Prior to Visit  Medication Sig Dispense Refill  . aspirin 81 MG EC tablet Take 81 mg by mouth daily.      Marland Kitchen atorvastatin (LIPITOR) 10 MG tablet Take 1 tablet (10 mg total) by mouth daily. 30 tablet 11  . betamethasone dipropionate (DIPROLENE) 0.05 % cream Apply 1 application topically 2 (two) times daily. Apply to hands, feet, ankles, buttocks and thighs    . bisacodyl (DULCOLAX) 10 MG suppository Reported on 03/19/2015    . ergocalciferol (VITAMIN D2) 50000 UNITS capsule Take 1 capsule (50,000 Units total) by mouth once a week. 6 capsule 0  . ferrous sulfate 325 (65 FE) MG tablet Take 1 tablet (325 mg total) by mouth daily. 30 tablet 6  . hydrOXYzine (ATARAX/VISTARIL) 25 MG tablet Take 1 tablet (25 mg total) by mouth every 8 (eight) hours as needed for itching or nausea. 60 tablet 1  . labetalol (NORMODYNE) 200 MG tablet Take 1 tablet (200 mg total) by mouth 3 (three) times daily. 180 tablet 3  . levofloxacin (LEVAQUIN) 500 MG tablet Take 1 tablet (500 mg total) by mouth daily. 7 tablet 0  . Magnesium Hydroxide (MILK OF MAGNESIA PO) Take by mouth. If no BM in 3 days give 30 cc milk of magnesium p.o. 1 dose in 24 hours as needed    . meclizine (ANTIVERT) 12.5 MG tablet Take 12.5 mg by mouth every 6 (six) hours as needed for dizziness.    Marland Kitchen oxyCODONE-acetaminophen (PERCOCET) 10-325 MG tablet Take 1 tablet by mouth 3 (three) times daily. 90 tablet 0  . torsemide (DEMADEX) 20 MG  tablet Take 1 tablet (20 mg total) by mouth daily. 30 tablet 2  . triamcinolone ointment (KENALOG) 0.1 % Apply 1 application topically 2 (two) times daily as needed (skin irritation).      No facility-administered medications prior to visit.    ROS Review of Systems  Constitutional: Positive for fatigue. Negative for appetite change and unexpected weight change.  HENT: Negative for congestion, nosebleeds, sneezing, sore throat and trouble swallowing.   Eyes: Negative for itching and visual disturbance.  Respiratory: Negative for cough and wheezing.   Cardiovascular: Negative for chest pain, palpitations and leg swelling.  Gastrointestinal: Negative for nausea, vomiting, diarrhea, blood in stool and abdominal distention.  Genitourinary: Negative for frequency and hematuria.  Musculoskeletal: Positive for myalgias. Negative for back pain, joint swelling, gait problem and neck pain.  Skin: Positive for wound. Negative for rash.  Neurological: Positive for weakness. Negative for dizziness, tremors and speech difficulty.  Psychiatric/Behavioral: Negative for sleep disturbance, dysphoric mood and agitation. The patient is not nervous/anxious.     Objective:  BP 90/60 mmHg  Pulse 62  Wt 221 lb (100.245 kg)  SpO2 95%  BP Readings from Last 3 Encounters:  04/16/15 90/60  04/14/15 160/90  03/19/15 120/54    Wt Readings from Last 3 Encounters:  04/16/15 221 lb (100.245 kg)  04/14/15 217 lb (98.431 kg)  03/19/15 219 lb (99.338 kg)    Physical Exam  Constitutional: He is oriented to person, place, and time. He appears well-developed. He appears distressed.  NAD  HENT:  Mouth/Throat: Oropharynx is clear and moist.  Eyes: Conjunctivae are normal. Pupils are equal, round, and reactive to light.  Neck: Normal range of motion. No JVD present. No thyromegaly present.  Cardiovascular: Normal rate, regular rhythm, normal heart sounds and intact distal pulses.  Exam reveals no gallop and no  friction rub.   No murmur heard. Pulmonary/Chest: Effort normal and breath sounds normal. No respiratory distress. He has no wheezes. He has no rales. He exhibits no tenderness.  Abdominal: Soft. Bowel sounds are normal. He exhibits no distension and no mass. There is no tenderness. There is no rebound and no guarding.  Musculoskeletal: Normal range of motion. He exhibits edema. He exhibits no tenderness.  Lymphadenopathy:    He has no cervical adenopathy.  Neurological: He is alert and oriented to person, place, and time. He has normal reflexes. No cranial nerve deficit. He exhibits normal muscle tone. He displays a negative Romberg sign. Coordination and gait normal.  Skin: Skin is warm. No rash noted. He is diaphoretic. No erythema.  Psychiatric: He has a normal mood and affect. His behavior is normal. Judgment and thought content normal.  incoherent  Lab Results  Component Value Date   WBC 9.2 03/12/2015   HGB 10.0* 03/12/2015   HCT 31.6* 03/12/2015   PLT 380.0 03/12/2015   GLUCOSE 111* 03/12/2015   CHOL 208* 12/05/2013   TRIG 111.0 12/05/2013   HDL 35.30* 12/05/2013   LDLCALC 151* 12/05/2013   ALT 17 03/12/2015   AST 15 03/12/2015   NA 143 03/12/2015   K 4.7 03/12/2015   CL 107 03/12/2015   CREATININE 1.08 03/12/2015   BUN 23 03/12/2015   CO2 26 03/12/2015   TSH 1.76 03/12/2015   PSA 13.26* 07/10/2014   INR 1.01 10/25/2010   HGBA1C 5.2 03/27/2014    Dg Chest 2 View  02/18/2015  CLINICAL DATA:  Leg edema EXAM: CHEST  2 VIEW COMPARISON:  10/24/2014 FINDINGS: Normal heart size and mediastinal contours. No acute infiltrate or edema. No effusion or pneumothorax. No acute osseous findings. IMPRESSION: No active cardiopulmonary disease. No cardiomegaly or pulmonary edema. Electronically Signed   By: Monte Fantasia M.D.   On: 02/18/2015 18:09   Dg Tibia/fibula Left  02/18/2015  CLINICAL DATA:  Leg pain and swelling. EXAM: LEFT TIBIA AND FIBULA - 2 VIEW COMPARISON:  None.  FINDINGS: Diffuse subcutaneous reticulation visualize lower extremity, greatest at the calf. There is no soft tissue gas or opaque foreign body. Knee osteoarthritis with diffuse marginal spurring. There is a moderate suprapatellar joint effusion. High appearance of the patella but preserved patellar shadow. No acute fracture.  No focal bone lesion. IMPRESSION: 1. Soft tissue swelling without gas or opaque foreign body. No acute osseous finding. 2. Tricompartmental knee osteoarthritis with joint effusion. Electronically Signed   By: Monte Fantasia M.D.   On: 02/18/2015 16:37   Dg Tibia/fibula Right  02/18/2015  CLINICAL DATA:  Leg pain and swelling. Generalized pain and weakness. EXAM: RIGHT TIBIA AND FIBULA - 2 VIEW COMPARISON:  Right foot radiographs same day. FINDINGS: There is no acute or healing fracture or suspicious bony lesion. There is a small osteophyte associated with the patella. There are enthesopathic changes at the quadriceps insertion site on the patella. There is degenerative spurring in the  medial and lateral compartments of the knee. There is a small knee joint effusion. Diffuse subcutaneous edema is noted in the visualized lower extremity. IMPRESSION: 1. Small knee effusion with mild tricompartmental osteoarthritis of the knee joint. 2. Diffuse subcutaneous edema. 3. No acute bony abnormality. Electronically Signed   By: Curlene Dolphin M.D.   On: 02/18/2015 16:35   Dg Foot Complete Left  02/18/2015  CLINICAL DATA:  Pain and swelling EXAM: LEFT FOOT - COMPLETE 3+ VIEW COMPARISON:  None. FINDINGS: Prior bunion repair. Two pins in the distal first metatarsal. Extensive degenerative change in the first MTP joint. Wires in the first proximal phalanx. Hammertoe deformities fourth through fifth digits. Negative for fracture.  No acute bony change Calcaneal spurring. IMPRESSION: No acute abnormality.  Chronic changes as above. Electronically Signed   By: Franchot Gallo M.D.   On: 02/18/2015 16:34     Dg Foot Complete Right  02/18/2015  CLINICAL DATA:  Leg pain and swelling EXAM: RIGHT FOOT COMPLETE - 3+ VIEW COMPARISON:  07/03/2014 FINDINGS: Three views of the right foot submitted. Hallux valgus deformity again noted. Again noted degenerative erosive changes first metatarsal phalangeal joint. Small plantar spur of calcaneus again noted. There is soft tissue swelling dorsal metatarsal region. Mild dorsal spurring tarsal region. IMPRESSION: No acute fracture or subluxation. Hallux valgus deformity. Degenerative changes as described above. Soft tissue swelling dorsal metatarsal region. Electronically Signed   By: Lahoma Crocker M.D.   On: 02/18/2015 16:36    Assessment & Plan:   Diagnoses and all orders for this visit:  Near syncope -     POCT glucose (manual entry)   I am having Mr. Parthasarathy maintain his aspirin, atorvastatin, hydrOXYzine, betamethasone dipropionate, triamcinolone ointment, labetalol, meclizine, Magnesium Hydroxide (MILK OF MAGNESIA PO), bisacodyl, oxyCODONE-acetaminophen, torsemide, ergocalciferol, ferrous sulfate, and levofloxacin.  No orders of the defined types were placed in this encounter.     Follow-up: No Follow-up on file.  [name], MD

## 2015-04-16 NOTE — ED Provider Notes (Signed)
CSN: [id number]     Arrival date & time 04/16/15  1130 History   First MD Initiated Contact with Patient 04/16/15 1154     Chief Complaint  Patient presents with  . Weakness     (Consider location/radiation/quality/duration/timing/severity/associated sxs/prior Treatment) HPI 75 year old male presents from his PCPs office by EMS after syncope. Patient was being checked in for a regular checkup and his blood pressure was in the 80s. He states he was feeling normal but then very shortly after felt acute dizziness and apparently passed out for a few seconds. Wife is unsure of exactly how long he passed out. Felt less dizzy when he awoke. No shortness of breath or chest pain. No headaches. Did not injure himself. Patient states he was in his normal state of health and has not had vomiting, diarrhea, or fevers. Patient states the only medication changes he has gone from labetalol 1 tablet 3 times a day to 2 tablets twice a day. He used to be on the twice a day regimen but states that he was changed to 3 times a day. About a week ago he has noticed that his blood pressure has been elevated so he changed back by himself. Chronic LE swelling that is not worse than typical.  Past Medical History  Diagnosis Date  . HTN (hypertension)   . Osteoarthritis   . BPH (benign prostatic hyperplasia)     Dr [name]  . CVA (cerebral infarction) 1998  . Vitamin D deficiency   . LBP (low back pain) 2010  . Gout   . Stroke ([place]) 1998  . High cholesterol   . Prostate cancer ([place]) 01/2008    gleason 3+4=7, PSA 4.93, vol 34.18 cc   Past Surgical History  Procedure Laterality Date  . Tonsillectomy    . Hernia repair      x2  . Knee arthroscopy w/ pcl and lcl  repair & tendon graft    . Correction hammer toe    . Prostate biopsy  01/2008    gleason 7  . Prostate biopsy  03/27/13    gleason 3+4=7, volume 34.18 cc  . Tonsillectomy    . Anterior cervical decomp/discectomy fusion N/A 11/22/2014    Procedure:  Cervical Six Corpectomy with graft and plating Cervical Five-Seven;  Surgeon: Erline Levine, MD;  Location: [facility];  Service: Neurosurgery;  Laterality: N/A;   Family History  Problem Relation Age of Onset  . Diabetes Other     1st degree relative  . Hypertension Other   . Cancer Brother     prostate, tx w/xrt   Social History  Substance Use Topics  . Smoking status: Never Smoker   . Smokeless tobacco: Never Used  . Alcohol Use: No    Review of Systems  Constitutional: Negative for fever.  Respiratory: Negative for shortness of breath.   Cardiovascular: Positive for leg swelling. Negative for chest pain and palpitations.  Gastrointestinal: Negative for vomiting and diarrhea.  Neurological: Positive for syncope and light-headedness.  All other systems reviewed and are negative.     Allergies  Diazepam; Amlodipine besylate; Cozaar; Doxazosin mesylate; Lisinopril; Ramipril; Spironolactone; and Bactrim  Home Medications   Prior to Admission medications   Medication Sig Start Date End Date Taking? Authorizing Provider  aspirin 81 MG EC tablet Take 81 mg by mouth daily.      Historical Provider, MD  atorvastatin (LIPITOR) 10 MG tablet Take 1 tablet (10 mg total) by mouth daily. 03/27/14   Aleksei Plotnikov  V, MD  betamethasone dipropionate (DIPROLENE) 0.05 % cream Apply 1 application topically 2 (two) times daily. Apply to hands, feet, ankles, buttocks and thighs 11/04/14   Historical Provider, MD  bisacodyl (DULCOLAX) 10 MG suppository Reported on 03/19/2015    Historical Provider, MD  ergocalciferol (VITAMIN D2) 50000 UNITS capsule Take 1 capsule (50,000 Units total) by mouth once a week. 03/12/15   [name], MD  ferrous sulfate 325 (65 FE) MG tablet Take 1 tablet (325 mg total) by mouth daily. 03/12/15   [name], MD  hydrOXYzine (ATARAX/VISTARIL) 25 MG tablet Take 1 tablet (25 mg total) by mouth every 8 (eight) hours as needed for itching or nausea.  07/10/14   [name], MD  labetalol (NORMODYNE) 200 MG tablet Take 1 tablet (200 mg total) by mouth 3 (three) times daily. 01/30/15   [name], MD  levofloxacin (LEVAQUIN) 500 MG tablet Take 1 tablet (500 mg total) by mouth daily. 03/19/15   [name], MD  Magnesium Hydroxide (MILK OF MAGNESIA PO) Take by mouth. If no BM in 3 days give 30 cc milk of magnesium p.o. 1 dose in 24 hours as needed    Historical Provider, MD  meclizine (ANTIVERT) 12.5 MG tablet Take 12.5 mg by mouth every 6 (six) hours as needed for dizziness.    Historical Provider, MD  oxyCODONE-acetaminophen (PERCOCET) 10-325 MG tablet Take 1 tablet by mouth 3 (three) times daily. 03/12/15   [name], MD  torsemide (DEMADEX) 20 MG tablet Take 1 tablet (20 mg total) by mouth daily. 03/12/15   [name], MD  triamcinolone ointment (KENALOG) 0.1 % Apply 1 application topically 2 (two) times daily as needed (skin irritation).  11/04/14   Historical Provider, MD   BP 100/74 mmHg  Pulse 61  Temp(Src) 97.5 F (36.4 C) (Oral)  Resp 19  SpO2 99% Physical Exam  Constitutional: He is oriented to person, place, and time. He appears well-developed and well-nourished.  HENT:  Head: Normocephalic and atraumatic.  Right Ear: External ear normal.  Left Ear: External ear normal.  Nose: Nose normal.  Eyes: EOM are normal. Pupils are equal, round, and reactive to light. Right eye exhibits no discharge. Left eye exhibits no discharge.  Neck: Neck supple.  Cardiovascular: Normal rate, regular rhythm, normal heart sounds and intact distal pulses.   No murmur heard. Pulmonary/Chest: Effort normal and breath sounds normal. He has no rales.  Abdominal: Soft. There is no tenderness.  Musculoskeletal: He exhibits edema (bilateral lower extremities).  Neurological: He is alert and oriented to person, place, and time.  CN 2-12 grossly intact. 5/5 strength in all 4 extremities. Grossly normal sensation.   Skin: Skin is warm and dry.  Nursing note and vitals reviewed.   ED Course  Procedures (including critical care time) Labs Review Labs Reviewed  COMPREHENSIVE METABOLIC PANEL - Abnormal; Notable for the following:    Glucose, Bld 140 (*)    Calcium 8.6 (*)    Total Protein 5.7 (*)    Albumin 3.0 (*)    ALT 15 (*)    GFR calc non Af Amer 55 (*)    All other components within normal limits  CBC WITH DIFFERENTIAL/PLATELET - Abnormal; Notable for the following:    RBC 3.97 (*)    Hemoglobin 10.3 (*)    HCT 33.0 (*)    MCH 25.9 (*)    RDW 16.4 (*)    Eosinophils Absolute 1.7 (*)  All other components within normal limits  BRAIN NATRIURETIC PEPTIDE - Abnormal; Notable for the following:    B Natriuretic Peptide 109.7 (*)    All other components within normal limits  URINALYSIS, ROUTINE W REFLEX MICROSCOPIC (NOT AT [facility]) - Abnormal; Notable for the following:    Ketones, ur 15 (*)    Protein, ur 30 (*)    Leukocytes, UA SMALL (*)    All other components within normal limits  URINE MICROSCOPIC-ADD ON - Abnormal; Notable for the following:    Squamous Epithelial / LPF 0-5 (*)    Bacteria, UA FEW (*)    All other components within normal limits  TROPONIN I  TROPONIN I    Imaging Review Dg Chest 2 View  04/16/2015  CLINICAL DATA:  Syncopal episode.  Hypoglycemia. EXAM: CHEST  2 VIEW COMPARISON:  02/18/2015 FINDINGS: The heart size and mediastinal contours are within normal limits. Both lungs are clear. No evidence of pleural effusion or pneumothorax. Cervical spine fusion hardware noted. IMPRESSION: No active cardiopulmonary disease. Electronically Signed   By: Earle Gell M.D.   On: 04/16/2015 13:02   I have personally reviewed and evaluated these images and lab results as part of my medical decision-making.   EKG Interpretation   Date/Time:  Wednesday April 16 2015 12:10:31 EST Ventricular Rate:  66 PR Interval:  183 QRS Duration: 101 QT Interval:  446 QTC  Calculation: 467 R Axis:   -20 Text Interpretation:  Sinus rhythm Borderline left axis deviation  Posterior infarct, old no significant change since Nov 2016 Confirmed by  Regenia Skeeter  MD, Uzoma Vivona ([id number]) on 04/16/2015 12:30:43 PM      MDM   Final diagnoses:  Syncope, unspecified syncope type    Patient presents with syncope. He is well-appearing here, blood pressure is now normal. Unclear exactly why he became hypotensive at the doctor's office but given the acute onset with very little warning feel he needs to be observed for an arrhythmia given prior history of CHF. Patient is currently stable. He'll be admitted to telemetry under observation status with the hospitalist. His extra beta blocker could be contributing but this seems unlikely given the transient nature of his symptoms and now normal BP.    Sherwood Gambler, MD 04/16/15 [phone]

## 2015-04-16 NOTE — ED Notes (Signed)
Pt to ER from doctors office where patient was being seen for a check up - while at the doctors office, pt BP was found to be 88/44 and patient became diaphoretic, pale, and altered. On EMS arrival patient remained altered for a short time and then came to, pt now pleasantly a/o x4, BP 116/59, HR 61, CBG 186. Pt states this has happened one time before.

## 2015-04-16 NOTE — ED Notes (Addendum)
Pt reports he already has taken his home medications today. Wife has taken all of patient belongings home.

## 2015-04-16 NOTE — Assessment & Plan Note (Addendum)
Pt walked in the office w/wife looking and acting normal. He suddenly became weak and incoherent. We were able ti sit him down. He was moaning and trying to answer questions. Became diaphoretic. Unable to get an EKG. Unable to transfer to the table. SBP 80. CBG 160. O2 sat 97% RA. We called ambulance. No complete LOC To [facility]

## 2015-04-16 NOTE — Assessment & Plan Note (Signed)
meds held

## 2015-04-16 NOTE — H&P (Signed)
[facility] History and Physical  Kurt Sexton [id number] DOB: [birth year] DOA: 04/16/2015  Referring physician: Regenia Skeeter PCP: [name], MD   Chief Complaint: syncope  HPI: Kurt Sexton is a delightful 75 y.o. male that includes hypertension, hyperlipidemia, osteoarthritis, BPH, history of prostate cancer, CVA, gout, chronic back pain presents to emergency department with the chief complaint of syncope. Initial evaluation in the emergency department reveals mild hypotension.  Information obtained from the patient and his wife who is at the bedside. He was in his usual state of health till this morning when while at his primary care provider's office for regular checkup found his blood pressure was in the 80s. He reports initially he felt fine initially thereafter he felt dizzy and "flushed". Reportedly he had a syncopal event. No documentation of how long he was unconscious. He reports his next memory was awakening in the mass. He denies chest pain palpitations headache dizziness. He denies shortness of breath cough fever chills abdominal pain. He denies any recent nausea vomiting diarrhea dysuria hematuria frequency. Patient does report adjusting his home beta blocker dosage as he found his blood pressure to be elevated. He reports about a week ago his dosage was decreased and since then he noticed his blood pressure trending up. He increased the dose to 2 tabs 3 times a day.  In the emergency department is afebrile hemodynamically stable and not hypoxic   Review of Systems:  10 point review of systems complete and all systems are negative except as indicated in the history of present illness  Past Medical History  Diagnosis Date  . HTN (hypertension)   . Osteoarthritis   . BPH (benign prostatic hyperplasia)     Dr [name]  . CVA (cerebral infarction) 1998  . Vitamin D deficiency   . LBP (low back pain) 2010  . Gout   . Stroke ([place]) 1998  . High cholesterol   . Prostate  cancer ([place]) 01/2008    gleason 3+4=7, PSA 4.93, vol 34.18 cc  . Venous insufficiency    Past Surgical History  Procedure Laterality Date  . Tonsillectomy    . Hernia repair      x2  . Knee arthroscopy w/ pcl and lcl  repair & tendon graft    . Correction hammer toe    . Prostate biopsy  01/2008    gleason 7  . Prostate biopsy  03/27/13    gleason 3+4=7, volume 34.18 cc  . Tonsillectomy    . Anterior cervical decomp/discectomy fusion N/A 11/22/2014    Procedure: Cervical Six Corpectomy with graft and plating Cervical Five-Seven;  Surgeon: Erline Levine, MD;  Location: [facility];  Service: Neurosurgery;  Laterality: N/A;   Social History:  reports that he has never smoked. He has never used smokeless tobacco. He reports that he does not drink alcohol or use illicit drugs.  Allergies  Allergen Reactions  . Diazepam Other (See Comments)    lethargic  . Amlodipine Besylate Other (See Comments)    dizzy  . Cozaar     Severe headache  . Doxazosin Mesylate Other (See Comments)    dizzy  . Lisinopril Other (See Comments)    headache  . Ramipril Other (See Comments)    headache  . Spironolactone Other (See Comments)    High potassium  . Bactrim [Sulfamethoxazole-Trimethoprim] Rash    Family History  Problem Relation Age of Onset  . Diabetes Other     1st degree relative  . Hypertension Other   .  Cancer Brother     prostate, tx w/xrt     Prior to Admission medications   Medication Sig Start Date End Date Taking? Authorizing Provider  aspirin 81 MG EC tablet Take 81 mg by mouth daily.      Historical Provider, MD  atorvastatin (LIPITOR) 10 MG tablet Take 1 tablet (10 mg total) by mouth daily. 03/27/14   [name], MD  betamethasone dipropionate (DIPROLENE) 0.05 % cream Apply 1 application topically 2 (two) times daily. Apply to hands, feet, ankles, buttocks and thighs 11/04/14   Historical Provider, MD  bisacodyl (DULCOLAX) 10 MG suppository Reported on 03/19/2015     Historical Provider, MD  ergocalciferol (VITAMIN D2) 50000 UNITS capsule Take 1 capsule (50,000 Units total) by mouth once a week. 03/12/15   [name], MD  ferrous sulfate 325 (65 FE) MG tablet Take 1 tablet (325 mg total) by mouth daily. 03/12/15   [name], MD  hydrOXYzine (ATARAX/VISTARIL) 25 MG tablet Take 1 tablet (25 mg total) by mouth every 8 (eight) hours as needed for itching or nausea. 07/10/14   [name], MD  labetalol (NORMODYNE) 200 MG tablet Take 1 tablet (200 mg total) by mouth 3 (three) times daily. 01/30/15   [name], MD  levofloxacin (LEVAQUIN) 500 MG tablet Take 1 tablet (500 mg total) by mouth daily. 03/19/15   [name], MD  Magnesium Hydroxide (MILK OF MAGNESIA PO) Take by mouth. If no BM in 3 days give 30 cc milk of magnesium p.o. 1 dose in 24 hours as needed    Historical Provider, MD  meclizine (ANTIVERT) 12.5 MG tablet Take 12.5 mg by mouth every 6 (six) hours as needed for dizziness.    Historical Provider, MD  oxyCODONE-acetaminophen (PERCOCET) 10-325 MG tablet Take 1 tablet by mouth 3 (three) times daily. 03/12/15   [name], MD  torsemide (DEMADEX) 20 MG tablet Take 1 tablet (20 mg total) by mouth daily. 03/12/15   [name], MD  triamcinolone ointment (KENALOG) 0.1 % Apply 1 application topically 2 (two) times daily as needed (skin irritation).  11/04/14   Historical Provider, MD   Physical Exam: Filed Vitals:   04/16/15 1144 04/16/15 1145 04/16/15 1200  BP: 100/74 144/74 147/82  Pulse: 61 63 68  Temp: 97.5 F (36.4 C)    TempSrc: Oral    Resp: 19 16 23   SpO2: 99% 100% 100%    Wt Readings from Last 3 Encounters:  04/16/15 100.245 kg (221 lb)  04/14/15 98.431 kg (217 lb)  03/19/15 99.338 kg (219 lb)    General:  Appears calm and comfortable, following quite pleasant  Eyes: PERRL, normal lids, irises & conjunctiva ENT: grossly normal hearing, lips & tongue Neck: no LAD, masses or  thyromegaly Cardiovascular: RRR, no m/r/g. Bilateral lower extremity is wrapped with ace and guaze.  Respiratory: CTA bilaterally, no w/r/r. Normal respiratory effort. Abdomen: soft, ntnd  guarding or rebounding  Skin: no rash or induration seen on limited exam Musculoskeletal: grossly normal tone BUE/BLE Psychiatric: grossly normal mood and affect, speech fluent and appropriate Neurologic: grossly non-focal. each clear facial symmetry           Labs on Admission:  Basic Metabolic Panel:  Recent Labs Lab 04/16/15 1217  NA 139  K 4.2  CL 107  CO2 25  GLUCOSE 140*  BUN 12  CREATININE 1.24  CALCIUM 8.6*   Liver Function Tests:  Recent Labs Lab 04/16/15 1217  AST 20  ALT 15*  ALKPHOS 118  BILITOT 0.4  PROT 5.7*  ALBUMIN 3.0*   No results for input(s): LIPASE, AMYLASE in the last 168 hours. No results for input(s): AMMONIA in the last 168 hours. CBC:  Recent Labs Lab 04/16/15 1217  WBC 5.7  NEUTROABS 2.6  HGB 10.3*  HCT 33.0*  MCV 83.1  PLT 242   Cardiac Enzymes:  Recent Labs Lab 04/16/15 1217  TROPONINI <0.03    BNP (last 3 results)  Recent Labs  02/18/15 1654 04/16/15 1217  BNP 159.2* 109.7*    ProBNP (last 3 results) No results for input(s): PROBNP in the last 8760 hours.  CBG: No results for input(s): GLUCAP in the last 168 hours.  Radiological Exams on Admission: Dg Chest 2 View  04/16/2015  CLINICAL DATA:  Syncopal episode.  Hypoglycemia. EXAM: CHEST  2 VIEW COMPARISON:  02/18/2015 FINDINGS: The heart size and mediastinal contours are within normal limits. Both lungs are clear. No evidence of pleural effusion or pneumothorax. Cervical spine fusion hardware noted. IMPRESSION: No active cardiopulmonary disease. Electronically Signed   By: Earle Gell M.D.   On: 04/16/2015 13:02    EKG: Independently reviewed. Sinus rhythm Borderline left axis deviation Posterior infarct, old no significant change since Nov  2016  Assessment/Plan Principal Problem:   Syncope Active Problems:   Constipation, chronic   Systolic hypertension with cerebrovascular disease   Edema   Hypertension   Anemia   Venous insufficiency  #1. Syncope/near syncope. Likely related to the fact patient increased home beta blocker due to trending up blood pressure. However wife reports his blood pressure is "up and down". Initial troponin negative EKG without acute changes. BNP 109 -Admit to telemetry -Check orthostatics -Hold antihypertensives -Monitor blood pressure every 4 hours 3 -When necessary hydralazine  #2. Extremity edema/venous insufficiency. History of same. bilateral lower extremity wraps in place -Wound consult  #3. Anemia. Likely of chronic disease. Hemoglobin 10.3 on admission. Review indicates this is his baseline. No signs or symptoms of bleeding -Continue home meds -Monitor  #4. Hypertension. Stable in the emergency department. Home medications include labetalol and torsemide. -Hold home medications for now -Monitor blood pressure closely  #5. History of CVA. Neuro exam benign -Continue aspirin -continue statin    Code Status: full DVT Prophylaxis: Family Communication: wife at bedside Disposition Plan: home when ready  Time spent: 44 minutes  [facility]

## 2015-04-17 ENCOUNTER — Encounter (HOSPITAL_COMMUNITY): Payer: Self-pay | Admitting: General Practice

## 2015-04-17 DIAGNOSIS — I10 Essential (primary) hypertension: Secondary | ICD-10-CM

## 2015-04-17 DIAGNOSIS — R609 Edema, unspecified: Secondary | ICD-10-CM | POA: Diagnosis not present

## 2015-04-17 DIAGNOSIS — I872 Venous insufficiency (chronic) (peripheral): Secondary | ICD-10-CM

## 2015-04-17 DIAGNOSIS — R55 Syncope and collapse: Secondary | ICD-10-CM

## 2015-04-17 DIAGNOSIS — K5900 Constipation, unspecified: Secondary | ICD-10-CM | POA: Diagnosis not present

## 2015-04-17 DIAGNOSIS — D649 Anemia, unspecified: Secondary | ICD-10-CM

## 2015-04-17 DIAGNOSIS — I674 Hypertensive encephalopathy: Secondary | ICD-10-CM

## 2015-04-17 LAB — CBC
HCT: 32.5 % — ABNORMAL LOW (ref 39.0–52.0)
Hemoglobin: 10 g/dL — ABNORMAL LOW (ref 13.0–17.0)
MCH: 25.7 pg — AB (ref 26.0–34.0)
MCHC: 30.8 g/dL (ref 30.0–36.0)
MCV: 83.5 fL (ref 78.0–100.0)
PLATELETS: 224 10*3/uL (ref 150–400)
RBC: 3.89 MIL/uL — AB (ref 4.22–5.81)
RDW: 16.3 % — AB (ref 11.5–15.5)
WBC: 5.6 10*3/uL (ref 4.0–10.5)

## 2015-04-17 LAB — GLUCOSE, CAPILLARY: Glucose-Capillary: 105 mg/dL — ABNORMAL HIGH (ref 65–99)

## 2015-04-17 LAB — BASIC METABOLIC PANEL
Anion gap: 7 (ref 5–15)
BUN: 9 mg/dL (ref 6–20)
CHLORIDE: 111 mmol/L (ref 101–111)
CO2: 24 mmol/L (ref 22–32)
CREATININE: 0.96 mg/dL (ref 0.61–1.24)
Calcium: 8.5 mg/dL — ABNORMAL LOW (ref 8.9–10.3)
GFR calc Af Amer: >60 mL/min (ref >60)
GFR calc non Af Amer: >60 mL/min (ref >60)
Glucose, Bld: 107 mg/dL — ABNORMAL HIGH (ref 65–99)
Potassium: 4.1 mmol/L (ref 3.5–5.1)
Sodium: 142 mmol/L (ref 135–145)

## 2015-04-17 NOTE — Care Management Note (Signed)
Case Management Note  Patient Details  Name: Kurt Sexton MRN: [id number] Date of Birth: 10/15/1940  Subjective/Objective:                 Patient from home with wife. Patient purposefully altered administration of BP meds resulting in syncopal episode. Patient in observation, and DC'd with in 24 hours. No CM needs identified form history.   Action/Plan:  DC to home, self care.  Expected Discharge Date:                  Expected Discharge Plan:  Home/Self Care  In-House Referral:     Discharge planning Services  CM Consult  Post Acute Care Choice:    Choice offered to:     DME Arranged:    DME Agency:     HH Arranged:    [place] Agency:     Status of Service:  Completed, signed off  Medicare Important Message Given:    Date Medicare IM Given:    Medicare IM give by:    Date Additional Medicare IM Given:    Additional Medicare Important Message give by:     If discussed at [place] of Stay Meetings, dates discussed:    Additional Comments:  Carles Collet, RN 04/17/2015, 1:45 PM

## 2015-04-17 NOTE — Progress Notes (Signed)
Nsg Discharge Note  Admit Date:  04/16/2015 Discharge date: 04/17/2015   Kurt Sexton to be D/C'd Home with home health per MD order.  AVS completed.  Copy for chart, and copy for patient signed, and dated. Patient/caregiver able to verbalize understanding.  Discharge Medication:   Medication List    TAKE these medications        aspirin 81 MG EC tablet  Take 81 mg by mouth daily.     betamethasone dipropionate 0.05 % cream  Commonly known as:  DIPROLENE  Apply 1 application topically 2 (two) times daily. Apply to hands, feet, ankles, buttocks and thighs     ergocalciferol 50000 units capsule  Commonly known as:  VITAMIN D2  Take 1 capsule (50,000 Units total) by mouth once a week.     ferrous sulfate 325 (65 FE) MG tablet  Take 1 tablet (325 mg total) by mouth daily.     hydrOXYzine 25 MG tablet  Commonly known as:  ATARAX/VISTARIL  Take 1 tablet (25 mg total) by mouth every 8 (eight) hours as needed for itching or nausea.     labetalol 200 MG tablet  Commonly known as:  NORMODYNE  Take 1 tablet (200 mg total) by mouth 3 (three) times daily.     meclizine 12.5 MG tablet  Commonly known as:  ANTIVERT  Take 12.5 mg by mouth every 6 (six) hours as needed for dizziness.     MILK OF MAGNESIA PO  Take by mouth. If no BM in 3 days give 30 cc milk of magnesium p.o. 1 dose in 24 hours as needed     triamcinolone ointment 0.1 %  Commonly known as:  KENALOG  Apply 1 application topically 2 (two) times daily as needed (skin irritation).        Discharge Assessment: Filed Vitals:   04/17/15 0531 04/17/15 1319  BP: 169/71 162/72  Pulse: 76 71  Temp: 98.7 F (37.1 C) 97.7 F (36.5 C)  Resp: 18 20   Skin clean, dry and intact without evidence of skin break down, no evidence of skin tears noted. IV catheter discontinued intact. Site without signs and symptoms of complications - no redness or edema noted at insertion site, patient denies c/o pain - only slight tenderness at  site.  Dressing with slight pressure applied.  D/c Instructions-Education: Discharge instructions given to patient/family with verbalized understanding. D/c education completed with patient/family including follow up instructions, medication list, d/c activities limitations if indicated, with other d/c instructions as indicated by MD - patient able to verbalize understanding, all questions fully answered. Patient instructed to return to ED, call 911, or call MD for any changes in condition.  Patient escorted via [place], and D/C home via private auto.  Dayle Points, RN 04/17/2015 3:28 PM

## 2015-04-17 NOTE — Discharge Summary (Signed)
Physician Discharge Summary  Kurt Sexton [id number] DOB: [birth year] DOA: 04/16/2015  PCP: [name], MD  Admit date: 04/16/2015 Discharge date: 04/17/2015  Time spent: 40 minutes  Recommendations for Outpatient Follow-up:  1. Follow-up with primary care physician within one week. 2. Patient instructed to measure his blood pressure at least daily and to take a log to his PCP appointment next week.   Discharge Diagnoses:  Principal Problem:   Syncope Active Problems:   Constipation, chronic   Systolic hypertension with cerebrovascular disease   Edema   Hypertension   Anemia   Venous insufficiency   Discharge Condition: Stable  Diet recommendation: Heart healthy  There were no vitals filed for this visit.  History of present illness:  Kurt Sexton is a delightful 75 y.o. male that includes hypertension, hyperlipidemia, osteoarthritis, BPH, history of prostate cancer, CVA, gout, chronic back pain presents to emergency department with the chief complaint of syncope. Initial evaluation in the emergency department reveals mild hypotension.  Information obtained from the patient and his wife who is at the bedside. He was in his usual state of health till this morning when while at his primary care provider's office for regular checkup found his blood pressure was in the 80s. He reports initially he felt fine initially thereafter he felt dizzy and "flushed". Reportedly he had a syncopal event. No documentation of how long he was unconscious. He reports his next memory was awakening in the mass. He denies chest pain palpitations headache dizziness. He denies shortness of breath cough fever chills abdominal pain. He denies any recent nausea vomiting diarrhea dysuria hematuria frequency. Patient does report adjusting his home beta blocker dosage as he found his blood pressure to be elevated. He reports about a week ago his dosage was decreased and since then he noticed his blood  pressure trending up. He increased the dose to 2 tabs 3 times a day.  In the emergency department is afebrile hemodynamically stable and not hypoxic  Hospital Course:   Syncope/near syncope -Patient has systolic blood pressure in the 80s and has PCP office and developed syncope/collapse. -Recently he was self-medicating by changing labetalol from 200 mg 3 times a day to 400 mg twice a day. -However wife reports his blood pressure is "up and down". Initial troponin negative EKG without acute changes. BNP 109 -Cardiac enzymes negative, no ACS. -Restarted hydralazine as prescribed, follow up with PCP within one week.  Extremity edema/venous insufficiency -bilateral lower extremity wraps in place -Wound consult  Anemia. Likely of chronic disease.  -Hemoglobin 10.3 on admission. Review indicates this is his baseline. No signs or symptoms of bleeding -Continue home meds -Monitor  Hypertension.  -Stable, Home medications include labetalol and torsemide. -Hold home medications for now -Monitor blood pressure closely  History of CVA -Continue aspirin -Continue statin   Procedures:  None  Consultations:  None  Discharge Exam: Filed Vitals:   04/17/15 0531 04/17/15 1319  BP: 169/71 162/72  Pulse: 76 71  Temp: 98.7 F (37.1 C) 97.7 F (36.5 C)  Resp: 18 20   General: Alert and awake, oriented x3, not in any acute distress. HEENT: anicteric sclera, pupils reactive to light and accommodation, EOMI CVS: S1-S2 clear, no murmur rubs or gallops Chest: clear to auscultation bilaterally, no wheezing, rales or rhonchi Abdomen: soft nontender, nondistended, normal bowel sounds, no organomegaly Extremities: no cyanosis, clubbing or edema noted bilaterally Neuro: Cranial nerves II-XII intact, no focal neurological deficits  Discharge Instructions   Discharge Instructions  Diet - low sodium heart healthy    Complete by:  As directed      Increase activity slowly    Complete  by:  As directed           Current Discharge Medication List    CONTINUE these medications which have NOT CHANGED   Details  aspirin 81 MG EC tablet Take 81 mg by mouth daily.      betamethasone dipropionate (DIPROLENE) 0.05 % cream Apply 1 application topically 2 (two) times daily. Apply to hands, feet, ankles, buttocks and thighs    ergocalciferol (VITAMIN D2) 50000 UNITS capsule Take 1 capsule (50,000 Units total) by mouth once a week. Qty: 6 capsule, Refills: 0    ferrous sulfate 325 (65 FE) MG tablet Take 1 tablet (325 mg total) by mouth daily. Qty: 30 tablet, Refills: 6    hydrOXYzine (ATARAX/VISTARIL) 25 MG tablet Take 1 tablet (25 mg total) by mouth every 8 (eight) hours as needed for itching or nausea. Qty: 60 tablet, Refills: 1    labetalol (NORMODYNE) 200 MG tablet Take 1 tablet (200 mg total) by mouth 3 (three) times daily. Qty: 180 tablet, Refills: 3    Magnesium Hydroxide (MILK OF MAGNESIA PO) Take by mouth. If no BM in 3 days give 30 cc milk of magnesium p.o. 1 dose in 24 hours as needed    meclizine (ANTIVERT) 12.5 MG tablet Take 12.5 mg by mouth every 6 (six) hours as needed for dizziness.    triamcinolone ointment (KENALOG) 0.1 % Apply 1 application topically 2 (two) times daily as needed (skin irritation).       STOP taking these medications     atorvastatin (LIPITOR) 10 MG tablet        Allergies  Allergen Reactions  . Diazepam Other (See Comments)    lethargic  . Amlodipine Besylate Other (See Comments)    dizzy  . Cozaar     Severe headache  . Doxazosin Mesylate Other (See Comments)    dizzy  . Lisinopril Other (See Comments)    headache  . Ramipril Other (See Comments)    headache  . Spironolactone Other (See Comments)    High potassium  . Bactrim [Sulfamethoxazole-Trimethoprim] Rash   Follow-up Information    Follow up with [name], MD In 1 week.   Specialty:  Internal Medicine   Contact information:   [address] [place] [id number] [phone]        The results of significant diagnostics from this hospitalization (including imaging, microbiology, ancillary and laboratory) are listed below for reference.    Significant Diagnostic Studies: Dg Chest 2 View  04/16/2015  CLINICAL DATA:  Syncopal episode.  Hypoglycemia. EXAM: CHEST  2 VIEW COMPARISON:  02/18/2015 FINDINGS: The heart size and mediastinal contours are within normal limits. Both lungs are clear. No evidence of pleural effusion or pneumothorax. Cervical spine fusion hardware noted. IMPRESSION: No active cardiopulmonary disease. Electronically Signed   By: Earle Gell M.D.   On: 04/16/2015 13:02    Microbiology: No results found for this or any previous visit (from the past 240 hour(s)).   Labs: Basic Metabolic Panel:  Recent Labs Lab 04/16/15 1217 04/17/15 0556  NA 139 142  K 4.2 4.1  CL 107 111  CO2 25 24  GLUCOSE 140* 107*  BUN 12 9  CREATININE 1.24 0.96  CALCIUM 8.6* 8.5*   Liver Function Tests:  Recent Labs Lab 04/16/15 1217  AST 20  ALT 15*  ALKPHOS 118  BILITOT 0.4  PROT 5.7*  ALBUMIN 3.0*   No results for input(s): LIPASE, AMYLASE in the last 168 hours. No results for input(s): AMMONIA in the last 168 hours. CBC:  Recent Labs Lab 04/16/15 1217 04/17/15 0556  WBC 5.7 5.6  NEUTROABS 2.6  --   HGB 10.3* 10.0*  HCT 33.0* 32.5*  MCV 83.1 83.5  PLT 242 224   Cardiac Enzymes:  Recent Labs Lab 04/16/15 1217 04/16/15 2021  TROPONINI <0.03 <0.03   BNP: BNP (last 3 results)  Recent Labs  02/18/15 1654 04/16/15 1217  BNP 159.2* 109.7*    ProBNP (last 3 results) No results for input(s): PROBNP in the last 8760 hours.  CBG:  Recent Labs Lab 04/17/15 0733  GLUCAP 105*       Signed:  Verlee Monte A MD.  [facility] 04/17/2015, 1:44 PM

## 2015-04-18 ENCOUNTER — Ambulatory Visit
Admission: RE | Admit: 2015-04-18 | Discharge: 2015-04-18 | Disposition: A | Discharge status: Home / Self Care | Payer: Medicare Other | Source: Ambulatory Visit | Attending: Neurology | Admitting: Neurology

## 2015-04-18 DIAGNOSIS — R2681 Unsteadiness on feet: Secondary | ICD-10-CM

## 2015-04-18 DIAGNOSIS — R292 Abnormal reflex: Secondary | ICD-10-CM

## 2015-04-18 DIAGNOSIS — G609 Hereditary and idiopathic neuropathy, unspecified: Secondary | ICD-10-CM

## 2015-04-18 DIAGNOSIS — M4806 Spinal stenosis, lumbar region: Secondary | ICD-10-CM | POA: Diagnosis not present

## 2015-04-18 DIAGNOSIS — M5000 Cervical disc disorder with myelopathy, unspecified cervical region: Secondary | ICD-10-CM

## 2015-04-18 DIAGNOSIS — R29898 Other symptoms and signs involving the musculoskeletal system: Secondary | ICD-10-CM

## 2015-04-19 DIAGNOSIS — I110 Hypertensive heart disease with heart failure: Secondary | ICD-10-CM | POA: Diagnosis not present

## 2015-04-19 DIAGNOSIS — I5030 Unspecified diastolic (congestive) heart failure: Secondary | ICD-10-CM | POA: Diagnosis not present

## 2015-04-19 DIAGNOSIS — M170 Bilateral primary osteoarthritis of knee: Secondary | ICD-10-CM | POA: Diagnosis not present

## 2015-04-19 DIAGNOSIS — I872 Venous insufficiency (chronic) (peripheral): Secondary | ICD-10-CM | POA: Diagnosis not present

## 2015-04-21 DIAGNOSIS — I872 Venous insufficiency (chronic) (peripheral): Secondary | ICD-10-CM | POA: Diagnosis not present

## 2015-04-21 DIAGNOSIS — I110 Hypertensive heart disease with heart failure: Secondary | ICD-10-CM | POA: Diagnosis not present

## 2015-04-21 DIAGNOSIS — M170 Bilateral primary osteoarthritis of knee: Secondary | ICD-10-CM | POA: Diagnosis not present

## 2015-04-21 DIAGNOSIS — I5030 Unspecified diastolic (congestive) heart failure: Secondary | ICD-10-CM | POA: Diagnosis not present

## 2015-04-22 ENCOUNTER — Telehealth: Payer: Self-pay | Admitting: Neurology

## 2015-04-22 DIAGNOSIS — I110 Hypertensive heart disease with heart failure: Secondary | ICD-10-CM | POA: Diagnosis not present

## 2015-04-22 DIAGNOSIS — I872 Venous insufficiency (chronic) (peripheral): Secondary | ICD-10-CM | POA: Diagnosis not present

## 2015-04-22 DIAGNOSIS — M170 Bilateral primary osteoarthritis of knee: Secondary | ICD-10-CM | POA: Diagnosis not present

## 2015-04-22 DIAGNOSIS — I5030 Unspecified diastolic (congestive) heart failure: Secondary | ICD-10-CM | POA: Diagnosis not present

## 2015-04-22 NOTE — Telephone Encounter (Signed)
Called and discussed results of MRI lumbar spine with patient which shows the following:  1. Severe spinal stenosis at L4-5, slightly increased since the prior study with slightly increased grade 1 spondylolisthesis. 2. Increased disc protrusion at L3-4 far lateral on the right with a slight mass effect upon the right L3 nerve lateral to the neural foramen. 3. New soft disc extrusion into the left lateral recess and left neural foramen at L1-2 which could affect the left L1 and L2 nerves. 4. Chronic moderately severe spinal stenosis at L2-3.  He is established patient of Dr. Vertell Limber and recommend that he follow-up with him as this explains his leg weakness which is not improving with PT.    Donika K. Carmack Pronto, DO

## 2015-04-23 ENCOUNTER — Ambulatory Visit (INDEPENDENT_AMBULATORY_CARE_PROVIDER_SITE_OTHER): Payer: Medicare Other | Admitting: Internal Medicine

## 2015-04-23 ENCOUNTER — Encounter: Payer: Self-pay | Admitting: Internal Medicine

## 2015-04-23 VITALS — BP 150/60 | HR 71 | Wt 218.0 lb

## 2015-04-23 DIAGNOSIS — D649 Anemia, unspecified: Secondary | ICD-10-CM | POA: Diagnosis not present

## 2015-04-23 DIAGNOSIS — I872 Venous insufficiency (chronic) (peripheral): Secondary | ICD-10-CM

## 2015-04-23 DIAGNOSIS — R0609 Other forms of dyspnea: Secondary | ICD-10-CM

## 2015-04-23 DIAGNOSIS — L97929 Non-pressure chronic ulcer of unspecified part of left lower leg with unspecified severity: Principal | ICD-10-CM

## 2015-04-23 DIAGNOSIS — I83019 Varicose veins of right lower extremity with ulcer of unspecified site: Secondary | ICD-10-CM

## 2015-04-23 DIAGNOSIS — R55 Syncope and collapse: Secondary | ICD-10-CM

## 2015-04-23 DIAGNOSIS — M170 Bilateral primary osteoarthritis of knee: Secondary | ICD-10-CM | POA: Diagnosis not present

## 2015-04-23 DIAGNOSIS — I110 Hypertensive heart disease with heart failure: Secondary | ICD-10-CM | POA: Diagnosis not present

## 2015-04-23 DIAGNOSIS — I83029 Varicose veins of left lower extremity with ulcer of unspecified site: Principal | ICD-10-CM

## 2015-04-23 DIAGNOSIS — I5030 Unspecified diastolic (congestive) heart failure: Secondary | ICD-10-CM | POA: Diagnosis not present

## 2015-04-23 DIAGNOSIS — L97919 Non-pressure chronic ulcer of unspecified part of right lower leg with unspecified severity: Principal | ICD-10-CM

## 2015-04-23 MED ORDER — FERROUS SULFATE 325 (65 FE) MG PO TABS: 325.0000 mg | ORAL_TABLET | Freq: Two times a day (BID) | ORAL | Status: DC

## 2015-04-23 MED ORDER — TORSEMIDE 20 MG PO TABS: 20.0000 mg | ORAL_TABLET | Freq: Every day | ORAL | Status: AC

## 2015-04-23 MED ORDER — VITAMIN D3 50 MCG (2000 UT) PO CAPS: 2000.0000 [IU] | ORAL_CAPSULE | Freq: Every day | ORAL | Status: AC

## 2015-04-23 NOTE — Assessment & Plan Note (Signed)
Better  

## 2015-04-23 NOTE — Assessment & Plan Note (Signed)
Out of iron Rx

## 2015-04-23 NOTE — Progress Notes (Signed)
Subjective:  Patient ID: Kurt Sexton, male    DOB: 1941-02-13  Age: 75 y.o. MRN: [id number]  CC: No chief complaint on file.   HPI Kurt Sexton presents for a LOC episode in the office on 04/17/15. [facility] records reviewed. F/u edema, HTN  Admit date: 04/16/2015 Discharge date: 04/17/2015    Recommendations for Outpatient Follow-up:  1. Follow-up with primary care physician within one week. 2. Patient instructed to measure his blood pressure at least daily and to take a log to his PCP appointment next week.   Discharge Diagnoses:  Principal Problem:  Syncope Active Problems:  Constipation, chronic  Systolic hypertension with cerebrovascular disease  Edema  Hypertension  Anemia  Venous insufficiency   Discharge Condition: Stable  Diet recommendation: Heart healthy  There were no vitals filed for this visit.  History of present illness:  Kurt Sexton is a delightful 75 y.o. male that includes hypertension, hyperlipidemia, osteoarthritis, BPH, history of prostate cancer, CVA, gout, chronic back pain presents to emergency department with the chief complaint of syncope. Initial evaluation in the emergency department reveals mild hypotension.  Information obtained from the patient and his wife who is at the bedside. He was in his usual state of health till this morning when while at his primary care provider's office for regular checkup found his blood pressure was in the 80s. He reports initially he felt fine initially thereafter he felt dizzy and "flushed". Reportedly he had a syncopal event. No documentation of how long he was unconscious. He reports his next memory was awakening in the mass. He denies chest pain palpitations headache dizziness. He denies shortness of breath cough fever chills abdominal pain. He denies any recent nausea vomiting diarrhea dysuria hematuria frequency. Patient does report adjusting his home beta blocker dosage as he found his blood  pressure to be elevated. He reports about a week ago his dosage was decreased and since then he noticed his blood pressure trending up. He increased the dose to 2 tabs 3 times a day.  In the emergency department is afebrile hemodynamically stable and not hypoxic  Hospital Course:   Syncope/near syncope -Patient has systolic blood pressure in the 80s and has PCP office and developed syncope/collapse. -Recently he was self-medicating by changing labetalol from 200 mg 3 times a day to 400 mg twice a day. -However wife reports his blood pressure is "up and down". Initial troponin negative EKG without acute changes. BNP 109 -Cardiac enzymes negative, no ACS.   Extremity edema/venous insufficiency -bilateral lower extremity wraps in place -Wound consult  Anemia. Likely of chronic disease.  -Hemoglobin 10.3 on admission. Review indicates this is his baseline. No signs or symptoms of bleeding -Continue home meds -Monitor  Hypertension.  -Stable, Home medications include labetalol and torsemide. -Hold home medications for now -Monitor blood pressure closely  History of CVA -Continue aspirin -Continue statin   Procedures:  None  Consultations:  None    Outpatient Prescriptions Prior to Visit  Medication Sig Dispense Refill  . aspirin 81 MG EC tablet Take 81 mg by mouth daily.      . betamethasone dipropionate (DIPROLENE) 0.05 % cream Apply 1 application topically 2 (two) times daily. Apply to hands, feet, ankles, buttocks and thighs    . ergocalciferol (VITAMIN D2) 50000 UNITS capsule Take 1 capsule (50,000 Units total) by mouth once a week. 6 capsule 0  . ferrous sulfate 325 (65 FE) MG tablet Take 1 tablet (325 mg total) by mouth daily.  30 tablet 6  . hydrOXYzine (ATARAX/VISTARIL) 25 MG tablet Take 1 tablet (25 mg total) by mouth every 8 (eight) hours as needed for itching or nausea. 60 tablet 1  . labetalol (NORMODYNE) 200 MG tablet Take 1 tablet (200 mg total) by mouth 3  (three) times daily. 180 tablet 3  . Magnesium Hydroxide (MILK OF MAGNESIA PO) Take by mouth. If no BM in 3 days give 30 cc milk of magnesium p.o. 1 dose in 24 hours as needed    . meclizine (ANTIVERT) 12.5 MG tablet Take 12.5 mg by mouth every 6 (six) hours as needed for dizziness.    . triamcinolone ointment (KENALOG) 0.1 % Apply 1 application topically 2 (two) times daily as needed (skin irritation).      No facility-administered medications prior to visit.    ROS Review of Systems  Constitutional: Positive for fatigue. Negative for appetite change and unexpected weight change.  HENT: Negative for congestion, nosebleeds, sneezing, sore throat and trouble swallowing.   Eyes: Negative for itching and visual disturbance.  Respiratory: Negative for cough and shortness of breath.   Cardiovascular: Positive for leg swelling. Negative for chest pain and palpitations.  Gastrointestinal: Negative for nausea, diarrhea, blood in stool and abdominal distention.  Genitourinary: Negative for frequency and hematuria.  Musculoskeletal: Positive for back pain, arthralgias and gait problem. Negative for joint swelling and neck pain.  Skin: Positive for wound. Negative for rash.  Neurological: Positive for weakness and light-headedness. Negative for dizziness, tremors and speech difficulty.  Psychiatric/Behavioral: Negative for suicidal ideas, sleep disturbance, dysphoric mood and agitation. The patient is not nervous/anxious.     Objective:  BP 150/60 mmHg  Pulse 71  Wt 218 lb (98.884 kg)  SpO2 98%  BP Readings from Last 3 Encounters:  04/23/15 150/60  04/17/15 162/72  04/16/15 90/60    Wt Readings from Last 3 Encounters:  04/23/15 218 lb (98.884 kg)  04/16/15 221 lb (100.245 kg)  04/14/15 217 lb (98.431 kg)    Physical Exam  Constitutional: He is oriented to person, place, and time. He appears well-developed. No distress.  NAD  HENT:  Mouth/Throat: Oropharynx is clear and moist.    Eyes: Conjunctivae are normal. Pupils are equal, round, and reactive to light.  Neck: Normal range of motion. No JVD present. No thyromegaly present.  Cardiovascular: Normal rate, regular rhythm, normal heart sounds and intact distal pulses.  Exam reveals no gallop and no friction rub.   No murmur heard. Pulmonary/Chest: Effort normal and breath sounds normal. No respiratory distress. He has no wheezes. He has no rales. He exhibits no tenderness.  Abdominal: Soft. Bowel sounds are normal. He exhibits no distension and no mass. There is no tenderness. There is no rebound and no guarding.  Musculoskeletal: Normal range of motion. He exhibits edema and tenderness.  Lymphadenopathy:    He has no cervical adenopathy.  Neurological: He is alert and oriented to person, place, and time. He has normal reflexes. No cranial nerve deficit. He exhibits normal muscle tone. He displays a negative Romberg sign. Coordination abnormal. Gait normal.  Skin: Skin is warm and dry. No rash noted. No erythema.  Psychiatric: He has a normal mood and affect. His behavior is normal. Judgment and thought content normal.  Edema B 1-2+ Small weeping wounds on legs Cane  Lab Results  Component Value Date   WBC 5.6 04/17/2015   HGB 10.0* 04/17/2015   HCT 32.5* 04/17/2015   PLT 224 04/17/2015   GLUCOSE 107*  04/17/2015   CHOL 208* 12/05/2013   TRIG 111.0 12/05/2013   HDL 35.30* 12/05/2013   LDLCALC 151* 12/05/2013   ALT 15* 04/16/2015   AST 20 04/16/2015   NA 142 04/17/2015   K 4.1 04/17/2015   CL 111 04/17/2015   CREATININE 0.96 04/17/2015   BUN 9 04/17/2015   CO2 24 04/17/2015   TSH 1.76 03/12/2015   PSA 13.26* 07/10/2014   INR 1.01 10/25/2010   HGBA1C 5.2 03/27/2014    Mr Lumbar Spine Wo Contrast  04/18/2015  CLINICAL DATA:  Bilateral leg weakness with hyperreflexia. Gait instability. EXAM: MRI LUMBAR SPINE WITHOUT CONTRAST TECHNIQUE: Multiplanar, multisequence MR imaging of the lumbar spine was  performed. No intravenous contrast was administered. COMPARISON:  CT scan of the abdomen and pelvis dated 10/24/2014 and lumbar MRI dated 10/18/2008 FINDINGS: Normal conus tip at L1.  Normal paraspinal soft tissues. T12-L1:  Normal. L1-2: Increased broad-based soft disc bulge with a new focal soft disc extrusion into the left lateral recess and proximal left neural foramen. New hypertrophy of the left ligamentum flavum. This should affect the left L1 and L2 nerves. The thecal sac is slightly compressed. L2-3: Small broad-based soft disc protrusion extending foraminal and extra foraminal, unchanged. Hypertrophy of the facet joints and ligamentum flavum create moderately severe spinal stenosis and bilateral lateral recess compression, right more than left. L3-4: Broad-based soft disc protrusion extending foraminal and extra foraminal on the right with a slight mass effect upon the right L3 nerve lateral to the neural foramen. The protrusion is slightly more prominent than on the prior study. Increased bilateral facet arthritis and hypertrophy narrowing both lateral recesses, right more than left. L4-5: 3 mm spondylolisthesis due to severe bilateral facet arthritis. This has slightly increased since the prior study. Severe spinal stenosis due to hypertrophy of the ligamentum flavum and facet joints with marked compression of the lateral recesses which should affect both L5 nerves. Moderately severe right foraminal stenosis which might affect the right L4 nerve. L5-S1: Normal disc. Minimal degenerative changes of the right facet joint. IMPRESSION: 1. Severe spinal stenosis at L4-5, slightly increased since the prior study with slightly increased grade 1 spondylolisthesis. 2. Increased disc protrusion at L3-4 far lateral on the right with a slight mass effect upon the right L3 nerve lateral to the neural foramen. 3. New soft disc extrusion into the left lateral recess and left neural foramen at L1-2 which could affect  the left L1 and L2 nerves. 4. Chronic moderately severe spinal stenosis at L2-3. Electronically Signed   By: Lorriane Shire M.D.   On: 04/18/2015 13:50    Assessment & Plan:   There are no diagnoses linked to this encounter. I am having Kurt Sexton maintain his aspirin, hydrOXYzine, betamethasone dipropionate, triamcinolone ointment, labetalol, meclizine, Magnesium Hydroxide (MILK OF MAGNESIA PO), ergocalciferol, and ferrous sulfate.  No orders of the defined types were placed in this encounter.     Follow-up: No Follow-up on file.  [name], MD

## 2015-04-23 NOTE — Assessment & Plan Note (Signed)
Chronic Home wound care 2/17 worse - ref to Wound Clinic Treat edema

## 2015-04-23 NOTE — Assessment & Plan Note (Signed)
Hosp records reviewed Meds adjusted No relapse

## 2015-04-23 NOTE — Progress Notes (Signed)
Pre visit review using our clinic review tool, if applicable. No additional management support is needed unless otherwise documented below in the visit note. 

## 2015-04-23 NOTE — Telephone Encounter (Signed)
Note and MRI faxed

## 2015-04-24 ENCOUNTER — Telehealth: Payer: Self-pay | Admitting: Neurology

## 2015-04-24 ENCOUNTER — Telehealth: Payer: Self-pay | Admitting: Internal Medicine

## 2015-04-24 NOTE — Telephone Encounter (Signed)
Kurt Sexton called from [facility] request extension for PT 2 time a week for 2 week start next week. Please call her back

## 2015-04-24 NOTE — Telephone Encounter (Signed)
Olin Hauser given verbal orders for below.

## 2015-04-24 NOTE — Telephone Encounter (Signed)
Noted  

## 2015-04-24 NOTE — Telephone Encounter (Signed)
FYI

## 2015-04-24 NOTE — Telephone Encounter (Signed)
Caryl Pina from Dr Melven Sartorius office called and said PT has an appointment with Dr Vertell Limber on 05/12/2015/Dawn CB# [phone]

## 2015-04-25 DIAGNOSIS — I5030 Unspecified diastolic (congestive) heart failure: Secondary | ICD-10-CM | POA: Diagnosis not present

## 2015-04-25 DIAGNOSIS — I110 Hypertensive heart disease with heart failure: Secondary | ICD-10-CM | POA: Diagnosis not present

## 2015-04-25 DIAGNOSIS — M170 Bilateral primary osteoarthritis of knee: Secondary | ICD-10-CM | POA: Diagnosis not present

## 2015-04-25 DIAGNOSIS — I872 Venous insufficiency (chronic) (peripheral): Secondary | ICD-10-CM | POA: Diagnosis not present

## 2015-04-28 DIAGNOSIS — M170 Bilateral primary osteoarthritis of knee: Secondary | ICD-10-CM | POA: Diagnosis not present

## 2015-04-28 DIAGNOSIS — I5030 Unspecified diastolic (congestive) heart failure: Secondary | ICD-10-CM | POA: Diagnosis not present

## 2015-04-28 DIAGNOSIS — I872 Venous insufficiency (chronic) (peripheral): Secondary | ICD-10-CM | POA: Diagnosis not present

## 2015-04-28 DIAGNOSIS — I110 Hypertensive heart disease with heart failure: Secondary | ICD-10-CM | POA: Diagnosis not present

## 2015-04-29 DIAGNOSIS — M170 Bilateral primary osteoarthritis of knee: Secondary | ICD-10-CM | POA: Diagnosis not present

## 2015-04-29 DIAGNOSIS — I110 Hypertensive heart disease with heart failure: Secondary | ICD-10-CM | POA: Diagnosis not present

## 2015-04-29 DIAGNOSIS — I872 Venous insufficiency (chronic) (peripheral): Secondary | ICD-10-CM | POA: Diagnosis not present

## 2015-04-29 DIAGNOSIS — I5030 Unspecified diastolic (congestive) heart failure: Secondary | ICD-10-CM | POA: Diagnosis not present

## 2015-04-30 DIAGNOSIS — M170 Bilateral primary osteoarthritis of knee: Secondary | ICD-10-CM | POA: Diagnosis not present

## 2015-04-30 DIAGNOSIS — I5030 Unspecified diastolic (congestive) heart failure: Secondary | ICD-10-CM | POA: Diagnosis not present

## 2015-04-30 DIAGNOSIS — I872 Venous insufficiency (chronic) (peripheral): Secondary | ICD-10-CM | POA: Diagnosis not present

## 2015-04-30 DIAGNOSIS — I110 Hypertensive heart disease with heart failure: Secondary | ICD-10-CM | POA: Diagnosis not present

## 2015-05-01 DIAGNOSIS — I872 Venous insufficiency (chronic) (peripheral): Secondary | ICD-10-CM | POA: Diagnosis not present

## 2015-05-01 DIAGNOSIS — M170 Bilateral primary osteoarthritis of knee: Secondary | ICD-10-CM | POA: Diagnosis not present

## 2015-05-01 DIAGNOSIS — I5030 Unspecified diastolic (congestive) heart failure: Secondary | ICD-10-CM | POA: Diagnosis not present

## 2015-05-01 DIAGNOSIS — M545 Low back pain: Secondary | ICD-10-CM

## 2015-05-01 DIAGNOSIS — I110 Hypertensive heart disease with heart failure: Secondary | ICD-10-CM | POA: Diagnosis not present

## 2015-05-01 DIAGNOSIS — R531 Weakness: Secondary | ICD-10-CM

## 2015-05-02 ENCOUNTER — Telehealth: Payer: Self-pay

## 2015-05-02 DIAGNOSIS — I110 Hypertensive heart disease with heart failure: Secondary | ICD-10-CM | POA: Diagnosis not present

## 2015-05-02 DIAGNOSIS — M170 Bilateral primary osteoarthritis of knee: Secondary | ICD-10-CM | POA: Diagnosis not present

## 2015-05-02 DIAGNOSIS — I872 Venous insufficiency (chronic) (peripheral): Secondary | ICD-10-CM | POA: Diagnosis not present

## 2015-05-02 DIAGNOSIS — I5030 Unspecified diastolic (congestive) heart failure: Secondary | ICD-10-CM | POA: Diagnosis not present

## 2015-05-02 NOTE — Telephone Encounter (Signed)
Home Health Cert/Plan of Care signed, faxed, copy sent to scan 

## 2015-05-05 DIAGNOSIS — N183 Chronic kidney disease, stage 3 (moderate): Secondary | ICD-10-CM | POA: Diagnosis not present

## 2015-05-05 DIAGNOSIS — N2581 Secondary hyperparathyroidism of renal origin: Secondary | ICD-10-CM | POA: Diagnosis not present

## 2015-05-05 DIAGNOSIS — E785 Hyperlipidemia, unspecified: Secondary | ICD-10-CM | POA: Diagnosis not present

## 2015-05-05 DIAGNOSIS — D631 Anemia in chronic kidney disease: Secondary | ICD-10-CM | POA: Diagnosis not present

## 2015-05-05 DIAGNOSIS — N189 Chronic kidney disease, unspecified: Secondary | ICD-10-CM | POA: Diagnosis not present

## 2015-05-05 DIAGNOSIS — N400 Enlarged prostate without lower urinary tract symptoms: Secondary | ICD-10-CM | POA: Diagnosis not present

## 2015-05-06 DIAGNOSIS — I5030 Unspecified diastolic (congestive) heart failure: Secondary | ICD-10-CM | POA: Diagnosis not present

## 2015-05-06 DIAGNOSIS — I110 Hypertensive heart disease with heart failure: Secondary | ICD-10-CM | POA: Diagnosis not present

## 2015-05-06 DIAGNOSIS — I872 Venous insufficiency (chronic) (peripheral): Secondary | ICD-10-CM | POA: Diagnosis not present

## 2015-05-06 DIAGNOSIS — M170 Bilateral primary osteoarthritis of knee: Secondary | ICD-10-CM | POA: Diagnosis not present

## 2015-05-09 DIAGNOSIS — I5030 Unspecified diastolic (congestive) heart failure: Secondary | ICD-10-CM | POA: Diagnosis not present

## 2015-05-09 DIAGNOSIS — I872 Venous insufficiency (chronic) (peripheral): Secondary | ICD-10-CM | POA: Diagnosis not present

## 2015-05-09 DIAGNOSIS — M170 Bilateral primary osteoarthritis of knee: Secondary | ICD-10-CM | POA: Diagnosis not present

## 2015-05-09 DIAGNOSIS — I110 Hypertensive heart disease with heart failure: Secondary | ICD-10-CM | POA: Diagnosis not present

## 2015-05-12 DIAGNOSIS — G959 Disease of spinal cord, unspecified: Secondary | ICD-10-CM | POA: Diagnosis not present

## 2015-05-12 DIAGNOSIS — M4802 Spinal stenosis, cervical region: Secondary | ICD-10-CM | POA: Diagnosis not present

## 2015-05-12 DIAGNOSIS — I10 Essential (primary) hypertension: Secondary | ICD-10-CM | POA: Diagnosis not present

## 2015-05-12 DIAGNOSIS — M5412 Radiculopathy, cervical region: Secondary | ICD-10-CM | POA: Diagnosis not present

## 2015-05-12 DIAGNOSIS — M4316 Spondylolisthesis, lumbar region: Secondary | ICD-10-CM | POA: Diagnosis not present

## 2015-05-13 DIAGNOSIS — Z8673 Personal history of transient ischemic attack (TIA), and cerebral infarction without residual deficits: Secondary | ICD-10-CM | POA: Diagnosis not present

## 2015-05-13 DIAGNOSIS — Z4800 Encounter for change or removal of nonsurgical wound dressing: Secondary | ICD-10-CM | POA: Diagnosis not present

## 2015-05-13 DIAGNOSIS — Z86718 Personal history of other venous thrombosis and embolism: Secondary | ICD-10-CM | POA: Diagnosis not present

## 2015-05-13 DIAGNOSIS — M4322 Fusion of spine, cervical region: Secondary | ICD-10-CM | POA: Diagnosis not present

## 2015-05-13 DIAGNOSIS — I110 Hypertensive heart disease with heart failure: Secondary | ICD-10-CM | POA: Diagnosis not present

## 2015-05-13 DIAGNOSIS — I5030 Unspecified diastolic (congestive) heart failure: Secondary | ICD-10-CM | POA: Diagnosis not present

## 2015-05-13 DIAGNOSIS — D649 Anemia, unspecified: Secondary | ICD-10-CM | POA: Diagnosis not present

## 2015-05-13 DIAGNOSIS — Z79891 Long term (current) use of opiate analgesic: Secondary | ICD-10-CM | POA: Diagnosis not present

## 2015-05-13 DIAGNOSIS — I872 Venous insufficiency (chronic) (peripheral): Secondary | ICD-10-CM | POA: Diagnosis not present

## 2015-05-13 DIAGNOSIS — Z7982 Long term (current) use of aspirin: Secondary | ICD-10-CM | POA: Diagnosis not present

## 2015-05-14 ENCOUNTER — Encounter (HOSPITAL_BASED_OUTPATIENT_CLINIC_OR_DEPARTMENT_OTHER): Payer: Medicare Other | Attending: Surgery

## 2015-05-14 DIAGNOSIS — Z8546 Personal history of malignant neoplasm of prostate: Secondary | ICD-10-CM | POA: Diagnosis not present

## 2015-05-14 DIAGNOSIS — L97821 Non-pressure chronic ulcer of other part of left lower leg limited to breakdown of skin: Secondary | ICD-10-CM | POA: Diagnosis not present

## 2015-05-14 DIAGNOSIS — M109 Gout, unspecified: Secondary | ICD-10-CM | POA: Insufficient documentation

## 2015-05-14 DIAGNOSIS — I890 Lymphedema, not elsewhere classified: Secondary | ICD-10-CM | POA: Insufficient documentation

## 2015-05-14 DIAGNOSIS — L97211 Non-pressure chronic ulcer of right calf limited to breakdown of skin: Secondary | ICD-10-CM | POA: Diagnosis not present

## 2015-05-14 DIAGNOSIS — L97221 Non-pressure chronic ulcer of left calf limited to breakdown of skin: Secondary | ICD-10-CM | POA: Insufficient documentation

## 2015-05-14 DIAGNOSIS — M1990 Unspecified osteoarthritis, unspecified site: Secondary | ICD-10-CM | POA: Insufficient documentation

## 2015-05-14 DIAGNOSIS — Z7982 Long term (current) use of aspirin: Secondary | ICD-10-CM | POA: Insufficient documentation

## 2015-05-14 DIAGNOSIS — E785 Hyperlipidemia, unspecified: Secondary | ICD-10-CM | POA: Diagnosis not present

## 2015-05-14 DIAGNOSIS — Z79899 Other long term (current) drug therapy: Secondary | ICD-10-CM | POA: Diagnosis not present

## 2015-05-14 DIAGNOSIS — L97811 Non-pressure chronic ulcer of other part of right lower leg limited to breakdown of skin: Secondary | ICD-10-CM | POA: Insufficient documentation

## 2015-05-14 DIAGNOSIS — Z8673 Personal history of transient ischemic attack (TIA), and cerebral infarction without residual deficits: Secondary | ICD-10-CM | POA: Diagnosis not present

## 2015-05-14 DIAGNOSIS — I10 Essential (primary) hypertension: Secondary | ICD-10-CM | POA: Insufficient documentation

## 2015-05-14 DIAGNOSIS — D649 Anemia, unspecified: Secondary | ICD-10-CM | POA: Diagnosis not present

## 2015-05-14 DIAGNOSIS — N400 Enlarged prostate without lower urinary tract symptoms: Secondary | ICD-10-CM | POA: Insufficient documentation

## 2015-05-16 DIAGNOSIS — I5030 Unspecified diastolic (congestive) heart failure: Secondary | ICD-10-CM | POA: Diagnosis not present

## 2015-05-16 DIAGNOSIS — M4322 Fusion of spine, cervical region: Secondary | ICD-10-CM | POA: Diagnosis not present

## 2015-05-16 DIAGNOSIS — I110 Hypertensive heart disease with heart failure: Secondary | ICD-10-CM | POA: Diagnosis not present

## 2015-05-16 DIAGNOSIS — Z8673 Personal history of transient ischemic attack (TIA), and cerebral infarction without residual deficits: Secondary | ICD-10-CM | POA: Diagnosis not present

## 2015-05-16 DIAGNOSIS — Z4800 Encounter for change or removal of nonsurgical wound dressing: Secondary | ICD-10-CM | POA: Diagnosis not present

## 2015-05-16 DIAGNOSIS — I872 Venous insufficiency (chronic) (peripheral): Secondary | ICD-10-CM | POA: Diagnosis not present

## 2015-05-16 DIAGNOSIS — D649 Anemia, unspecified: Secondary | ICD-10-CM | POA: Diagnosis not present

## 2015-05-16 DIAGNOSIS — Z7982 Long term (current) use of aspirin: Secondary | ICD-10-CM | POA: Diagnosis not present

## 2015-05-16 DIAGNOSIS — Z79891 Long term (current) use of opiate analgesic: Secondary | ICD-10-CM | POA: Diagnosis not present

## 2015-05-16 DIAGNOSIS — Z86718 Personal history of other venous thrombosis and embolism: Secondary | ICD-10-CM | POA: Diagnosis not present

## 2015-05-19 DIAGNOSIS — M4322 Fusion of spine, cervical region: Secondary | ICD-10-CM | POA: Diagnosis not present

## 2015-05-19 DIAGNOSIS — Z79891 Long term (current) use of opiate analgesic: Secondary | ICD-10-CM | POA: Diagnosis not present

## 2015-05-19 DIAGNOSIS — Z8673 Personal history of transient ischemic attack (TIA), and cerebral infarction without residual deficits: Secondary | ICD-10-CM | POA: Diagnosis not present

## 2015-05-19 DIAGNOSIS — I5030 Unspecified diastolic (congestive) heart failure: Secondary | ICD-10-CM | POA: Diagnosis not present

## 2015-05-19 DIAGNOSIS — D649 Anemia, unspecified: Secondary | ICD-10-CM | POA: Diagnosis not present

## 2015-05-19 DIAGNOSIS — Z86718 Personal history of other venous thrombosis and embolism: Secondary | ICD-10-CM | POA: Diagnosis not present

## 2015-05-19 DIAGNOSIS — Z7982 Long term (current) use of aspirin: Secondary | ICD-10-CM | POA: Diagnosis not present

## 2015-05-19 DIAGNOSIS — I110 Hypertensive heart disease with heart failure: Secondary | ICD-10-CM | POA: Diagnosis not present

## 2015-05-19 DIAGNOSIS — I872 Venous insufficiency (chronic) (peripheral): Secondary | ICD-10-CM | POA: Diagnosis not present

## 2015-05-19 DIAGNOSIS — Z4800 Encounter for change or removal of nonsurgical wound dressing: Secondary | ICD-10-CM | POA: Diagnosis not present

## 2015-05-21 ENCOUNTER — Encounter (HOSPITAL_BASED_OUTPATIENT_CLINIC_OR_DEPARTMENT_OTHER): Payer: Medicare Other | Attending: Surgery

## 2015-05-21 DIAGNOSIS — I10 Essential (primary) hypertension: Secondary | ICD-10-CM | POA: Diagnosis not present

## 2015-05-21 DIAGNOSIS — Z8673 Personal history of transient ischemic attack (TIA), and cerebral infarction without residual deficits: Secondary | ICD-10-CM | POA: Insufficient documentation

## 2015-05-21 DIAGNOSIS — L97211 Non-pressure chronic ulcer of right calf limited to breakdown of skin: Secondary | ICD-10-CM | POA: Diagnosis not present

## 2015-05-21 DIAGNOSIS — L97811 Non-pressure chronic ulcer of other part of right lower leg limited to breakdown of skin: Secondary | ICD-10-CM | POA: Diagnosis not present

## 2015-05-21 DIAGNOSIS — I890 Lymphedema, not elsewhere classified: Secondary | ICD-10-CM | POA: Insufficient documentation

## 2015-05-21 DIAGNOSIS — L97821 Non-pressure chronic ulcer of other part of left lower leg limited to breakdown of skin: Secondary | ICD-10-CM | POA: Diagnosis not present

## 2015-05-21 DIAGNOSIS — M1990 Unspecified osteoarthritis, unspecified site: Secondary | ICD-10-CM | POA: Diagnosis not present

## 2015-05-21 DIAGNOSIS — L97221 Non-pressure chronic ulcer of left calf limited to breakdown of skin: Secondary | ICD-10-CM | POA: Diagnosis not present

## 2015-05-23 DIAGNOSIS — Z4800 Encounter for change or removal of nonsurgical wound dressing: Secondary | ICD-10-CM | POA: Diagnosis not present

## 2015-05-23 DIAGNOSIS — I110 Hypertensive heart disease with heart failure: Secondary | ICD-10-CM | POA: Diagnosis not present

## 2015-05-23 DIAGNOSIS — Z86718 Personal history of other venous thrombosis and embolism: Secondary | ICD-10-CM | POA: Diagnosis not present

## 2015-05-23 DIAGNOSIS — Z8673 Personal history of transient ischemic attack (TIA), and cerebral infarction without residual deficits: Secondary | ICD-10-CM | POA: Diagnosis not present

## 2015-05-23 DIAGNOSIS — D649 Anemia, unspecified: Secondary | ICD-10-CM | POA: Diagnosis not present

## 2015-05-23 DIAGNOSIS — I872 Venous insufficiency (chronic) (peripheral): Secondary | ICD-10-CM | POA: Diagnosis not present

## 2015-05-23 DIAGNOSIS — Z79891 Long term (current) use of opiate analgesic: Secondary | ICD-10-CM | POA: Diagnosis not present

## 2015-05-23 DIAGNOSIS — I5030 Unspecified diastolic (congestive) heart failure: Secondary | ICD-10-CM | POA: Diagnosis not present

## 2015-05-23 DIAGNOSIS — M4322 Fusion of spine, cervical region: Secondary | ICD-10-CM | POA: Diagnosis not present

## 2015-05-23 DIAGNOSIS — Z7982 Long term (current) use of aspirin: Secondary | ICD-10-CM | POA: Diagnosis not present

## 2015-05-26 DIAGNOSIS — I872 Venous insufficiency (chronic) (peripheral): Secondary | ICD-10-CM | POA: Diagnosis not present

## 2015-05-26 DIAGNOSIS — I110 Hypertensive heart disease with heart failure: Secondary | ICD-10-CM | POA: Diagnosis not present

## 2015-05-26 DIAGNOSIS — Z8673 Personal history of transient ischemic attack (TIA), and cerebral infarction without residual deficits: Secondary | ICD-10-CM | POA: Diagnosis not present

## 2015-05-26 DIAGNOSIS — D649 Anemia, unspecified: Secondary | ICD-10-CM | POA: Diagnosis not present

## 2015-05-26 DIAGNOSIS — Z86718 Personal history of other venous thrombosis and embolism: Secondary | ICD-10-CM | POA: Diagnosis not present

## 2015-05-26 DIAGNOSIS — I5030 Unspecified diastolic (congestive) heart failure: Secondary | ICD-10-CM | POA: Diagnosis not present

## 2015-05-26 DIAGNOSIS — Z7982 Long term (current) use of aspirin: Secondary | ICD-10-CM | POA: Diagnosis not present

## 2015-05-26 DIAGNOSIS — Z79891 Long term (current) use of opiate analgesic: Secondary | ICD-10-CM | POA: Diagnosis not present

## 2015-05-26 DIAGNOSIS — Z4800 Encounter for change or removal of nonsurgical wound dressing: Secondary | ICD-10-CM | POA: Diagnosis not present

## 2015-05-26 DIAGNOSIS — M4322 Fusion of spine, cervical region: Secondary | ICD-10-CM | POA: Diagnosis not present

## 2015-05-27 ENCOUNTER — Ambulatory Visit (INDEPENDENT_AMBULATORY_CARE_PROVIDER_SITE_OTHER): Payer: Medicare Other

## 2015-05-27 VITALS — BP 164/70 | HR 74 | Ht 76.0 in | Wt 222.4 lb

## 2015-05-27 DIAGNOSIS — Z0000 Encounter for general adult medical examination without abnormal findings: Secondary | ICD-10-CM

## 2015-05-27 NOTE — Patient Instructions (Addendum)
Kurt Sexton , Thank you for taking time to come for your Medicare Wellness Visit. I appreciate your ongoing commitment to your health goals. Please review the following plan we discussed and let me know if I can assist you in the future.   Will discuss colonoscopy with dr. Alain Marion   Will have eye exam soon  Can have hearing test when you can do so.   Will wait and talk to Dr. Alain Marion about prevnar  Goals is to get better and get back to his life  These are the goals we discussed: Goals    None      This is a list of the screening recommended for you and due dates:  Health Maintenance  Topic Date Due  . Pneumonia vaccines (2 of 2 - PCV13) 05/02/2009  . Colon Cancer Screening  05/19/2015  . Flu Shot  10/21/2015  . Tetanus Vaccine  03/21/2019  . Shingles Vaccine  Completed     Colonoscopy A colonoscopy is an exam to look at the entire large intestine (colon). This exam can help find problems such as tumors, polyps, inflammation, and areas of bleeding. The exam takes about 1 hour.  LET [facility] CARE PROVIDER KNOW ABOUT:   Any allergies you have.  All medicines you are taking, including vitamins, herbs, eye drops, creams, and over-the-counter medicines.  Previous problems you or members of your family have had with the use of anesthetics.  Any blood disorders you have.  Previous surgeries you have had.  Medical conditions you have. RISKS AND COMPLICATIONS  Generally, this is a safe procedure. However, as with any procedure, complications can occur. Possible complications include:  Bleeding.  Tearing or rupture of the colon wall.  Reaction to medicines given during the exam.  Infection (rare). BEFORE THE PROCEDURE   Ask your health care provider about changing or stopping your regular medicines.  You may be prescribed an oral bowel prep. This involves drinking a large amount of medicated liquid, starting the day before your procedure. The liquid will cause  you to have multiple loose stools until your stool is almost clear or light green. This cleans out your colon in preparation for the procedure.  Do not eat or drink anything else once you have started the bowel prep, unless your health care provider tells you it is safe to do so.  Arrange for someone to drive you home after the procedure. PROCEDURE   You will be given medicine to help you relax (sedative).  You will lie on your side with your knees bent.  A long, flexible tube with a light and camera on the end (colonoscope) will be inserted through the rectum and into the colon. The camera sends video back to a computer screen as it moves through the colon. The colonoscope also releases carbon dioxide gas to inflate the colon. This helps your health care provider see the area better.  During the exam, your health care provider may take a small tissue sample (biopsy) to be examined under a microscope if any abnormalities are found.  The exam is finished when the entire colon has been viewed. AFTER THE PROCEDURE   Do not drive for 24 hours after the exam.  You may have a small amount of blood in your stool.  You may pass moderate amounts of gas and have mild abdominal cramping or bloating. This is caused by the gas used to inflate your colon during the exam.  Ask when your test results will  be ready and how you will get your results. Make sure you get your test results.   This information is not intended to replace advice given to you by your health care provider. Make sure you discuss any questions you have with your health care provider.   Document Released: 03/05/2000 Document Revised: 12/27/2012 Document Reviewed: 11/13/2012 Elsevier Interactive Patient Education 2016 [place] in the Home  Falls can cause injuries. They can happen to people of all ages. There are many things you can do to make your home safe and to help prevent falls.  WHAT CAN I DO ON THE  OUTSIDE OF MY HOME?  Regularly fix the edges of walkways and driveways and fix any cracks.  Remove anything that might make you trip as you walk through a door, such as a raised step or threshold.  Trim any bushes or trees on the path to your home.  Use bright outdoor lighting.  Clear any walking paths of anything that might make someone trip, such as rocks or tools.  Regularly check to see if handrails are loose or broken. Make sure that both sides of any steps have handrails.  Any raised decks and porches should have guardrails on the edges.  Have any leaves, snow, or ice cleared regularly.  Use sand or salt on walking paths during winter.  Clean up any spills in your garage right away. This includes oil or grease spills. WHAT CAN I DO IN THE BATHROOM?   Use night lights.  Install grab bars by the toilet and in the tub and shower. Do not use towel bars as grab bars.  Use non-skid mats or decals in the tub or shower.  If you need to sit down in the shower, use a plastic, non-slip stool.  Keep the floor dry. Clean up any water that spills on the floor as soon as it happens.  Remove soap buildup in the tub or shower regularly.  Attach bath mats securely with double-sided non-slip rug tape.  Do not have throw rugs and other things on the floor that can make you trip. WHAT CAN I DO IN THE BEDROOM?  Use night lights.  Make sure that you have a light by your bed that is easy to reach.  Do not use any sheets or blankets that are too big for your bed. They should not hang down onto the floor.  Have a firm chair that has side arms. You can use this for support while you get dressed.  Do not have throw rugs and other things on the floor that can make you trip. WHAT CAN I DO IN THE KITCHEN?  Clean up any spills right away.  Avoid walking on wet floors.  Keep items that you use a lot in easy-to-reach places.  If you need to reach something above you, use a strong step  stool that has a grab bar.  Keep electrical cords out of the way.  Do not use floor polish or wax that makes floors slippery. If you must use wax, use non-skid floor wax.  Do not have throw rugs and other things on the floor that can make you trip. WHAT CAN I DO WITH MY STAIRS?  Do not leave any items on the stairs.  Make sure that there are handrails on both sides of the stairs and use them. Fix handrails that are broken or loose. Make sure that handrails are as long as the stairways.  Check any carpeting  to make sure that it is firmly attached to the stairs. Fix any carpet that is loose or worn.  Avoid having throw rugs at the top or bottom of the stairs. If you do have throw rugs, attach them to the floor with carpet tape.  Make sure that you have a light switch at the top of the stairs and the bottom of the stairs. If you do not have them, ask someone to add them for you. WHAT ELSE CAN I DO TO HELP PREVENT FALLS?  Wear shoes that:  Do not have high heels.  Have rubber bottoms.  Are comfortable and fit you well.  Are closed at the toe. Do not wear sandals.  If you use a stepladder:  Make sure that it is fully opened. Do not climb a closed stepladder.  Make sure that both sides of the stepladder are locked into place.  Ask someone to hold it for you, if possible.  Clearly mark and make sure that you can see:  Any grab bars or handrails.  First and last steps.  Where the edge of each step is.  Use tools that help you move around (mobility aids) if they are needed. These include:  Canes.  Walkers.  Scooters.  Crutches.  Turn on the lights when you go into a dark area. Replace any light bulbs as soon as they burn out.  Set up your furniture so you have a clear path. Avoid moving your furniture around.  If any of your floors are uneven, fix them.  If there are any pets around you, be aware of where they are.  Review your medicines with your doctor. Some  medicines can make you feel dizzy. This can increase your chance of falling. Ask your doctor what other things that you can do to help prevent falls.   This information is not intended to replace advice given to you by your health care provider. Make sure you discuss any questions you have with your health care provider.   Document Released: 01/02/2009 Document Revised: 07/23/2014 Document Reviewed: 04/12/2014 Elsevier Interactive Patient Education 2016 [place] Maintenance, Male A healthy lifestyle and preventative care can promote health and wellness.  Maintain regular health, dental, and eye exams.  Eat a healthy diet. Foods like vegetables, fruits, whole grains, low-fat dairy products, and lean protein foods contain the nutrients you need and are low in calories. Decrease your intake of foods high in solid fats, added sugars, and salt. Get information about a proper diet from your health care provider, if necessary.  Regular physical exercise is one of the most important things you can do for your health. Most adults should get at least 150 minutes of moderate-intensity exercise (any activity that increases your heart rate and causes you to sweat) each week. In addition, most adults need muscle-strengthening exercises on 2 or more days a week.   Maintain a healthy weight. The body mass index (BMI) is a screening tool to identify possible weight problems. It provides an estimate of body fat based on height and weight. Your health care provider can find your BMI and can help you achieve or maintain a healthy weight. For males 20 years and older:  A BMI below 18.5 is considered underweight.  A BMI of 18.5 to 24.9 is normal.  A BMI of 25 to 29.9 is considered overweight.  A BMI of 30 and above is considered obese.  Maintain normal blood lipids and cholesterol by exercising and minimizing  your intake of saturated fat. Eat a balanced diet with plenty of fruits and vegetables.  Blood tests for lipids and cholesterol should begin at age 30 and be repeated every 5 years. If your lipid or cholesterol levels are high, you are over age 3, or you are at high risk for heart disease, you may need your cholesterol levels checked more frequently.Ongoing high lipid and cholesterol levels should be treated with medicines if diet and exercise are not working.  If you smoke, find out from your health care provider how to quit. If you do not use tobacco, do not start.  Lung cancer screening is recommended for adults aged 82-80 years who are at high risk for developing lung cancer because of a history of smoking. A yearly low-dose CT scan of the lungs is recommended for people who have at least a 30-pack-year history of smoking and are current smokers or have quit within the past 15 years. A pack year of smoking is smoking an average of 1 pack of cigarettes a day for 1 year (for example, a 30-pack-year history of smoking could mean smoking 1 pack a day for 30 years or 2 packs a day for 15 years). Yearly screening should continue until the smoker has stopped smoking for at least 15 years. Yearly screening should be stopped for people who develop a health problem that would prevent them from having lung cancer treatment.  If you choose to drink alcohol, do not have more than 2 drinks per day. One drink is considered to be 12 oz (360 mL) of beer, 5 oz (150 mL) of wine, or 1.5 oz (45 mL) of liquor.  Avoid the use of street drugs. Do not share needles with anyone. Ask for help if you need support or instructions about stopping the use of drugs.  High blood pressure causes heart disease and increases the risk of stroke. High blood pressure is more likely to develop in:  People who have blood pressure in the end of the normal range (100-139/85-89 mm Hg).  People who are overweight or obese.  People who are African American.  If you are 80-64 years of age, have your blood pressure checked  every 3-5 years. If you are 33 years of age or older, have your blood pressure checked every year. You should have your blood pressure measured twice--once when you are at a hospital or clinic, and once when you are not at a hospital or clinic. Record the average of the two measurements. To check your blood pressure when you are not at a hospital or clinic, you can use:  An automated blood pressure machine at a pharmacy.  A home blood pressure monitor.  If you are 57-78 years old, ask your health care provider if you should take aspirin to prevent heart disease.  Diabetes screening involves taking a blood sample to check your fasting blood sugar level. This should be done once every 3 years after age 47 if you are at a normal weight and without risk factors for diabetes. Testing should be considered at a younger age or be carried out more frequently if you are overweight and have at least 1 risk factor for diabetes.  Colorectal cancer can be detected and often prevented. Most routine colorectal cancer screening begins at the age of 20 and continues through age 35. However, your health care provider may recommend screening at an earlier age if you have risk factors for colon cancer. On a yearly basis, your health  care provider may provide home test kits to check for hidden blood in the stool. A small camera at the end of a tube may be used to directly examine the colon (sigmoidoscopy or colonoscopy) to detect the earliest forms of colorectal cancer. Talk to your health care provider about this at age 65 when routine screening begins. A direct exam of the colon should be repeated every 5-10 years through age 45, unless early forms of precancerous polyps or small growths are found.  People who are at an increased risk for hepatitis B should be screened for this virus. You are considered at high risk for hepatitis B if:  You were born in a country where hepatitis B occurs often. Talk with your health care  provider about which countries are considered high risk.  Your parents were born in a high-risk country and you have not received a shot to protect against hepatitis B (hepatitis B vaccine).  You have HIV or AIDS.  You use needles to inject street drugs.  You live with, or have sex with, someone who has hepatitis B.  You are a man who has sex with other men (MSM).  You get hemodialysis treatment.  You take certain medicines for conditions like cancer, organ transplantation, and autoimmune conditions.  Hepatitis C blood testing is recommended for all people born from [birth year] through 1966 and any individual with known risk factors for hepatitis C.  Healthy men should no longer receive prostate-specific antigen (PSA) blood tests as part of routine cancer screening. Talk to your health care provider about prostate cancer screening.  Testicular cancer screening is not recommended for adolescents or adult males who have no symptoms. Screening includes self-exam, a health care provider exam, and other screening tests. Consult with your health care provider about any symptoms you have or any concerns you have about testicular cancer.  Practice safe sex. Use condoms and avoid high-risk sexual practices to reduce the spread of sexually transmitted infections (STIs).  You should be screened for STIs, including gonorrhea and chlamydia if:  You are sexually active and are younger than 24 years.  You are older than 24 years, and your health care provider tells you that you are at risk for this type of infection.  Your sexual activity has changed since you were last screened, and you are at an increased risk for chlamydia or gonorrhea. Ask your health care provider if you are at risk.  If you are at risk of being infected with HIV, it is recommended that you take a prescription medicine daily to prevent HIV infection. This is called pre-exposure prophylaxis (PrEP). You are considered at risk if:  You  are a man who has sex with other men (MSM).  You are a heterosexual man who is sexually active with multiple partners.  You take drugs by injection.  You are sexually active with a partner who has HIV.  Talk with your health care provider about whether you are at high risk of being infected with HIV. If you choose to begin PrEP, you should first be tested for HIV. You should then be tested every 3 months for as long as you are taking PrEP.  Use sunscreen. Apply sunscreen liberally and repeatedly throughout the day. You should seek shade when your shadow is shorter than you. Protect yourself by wearing long sleeves, pants, a wide-brimmed hat, and sunglasses year round whenever you are outdoors.  Tell your health care provider of new moles or changes in moles, especially  if there is a change in shape or color. Also, tell your health care provider if a mole is larger than the size of a pencil eraser.  A one-time screening for abdominal aortic aneurysm (AAA) and surgical repair of large AAAs by ultrasound is recommended for men aged 70-75 years who are current or former smokers.  Stay current with your vaccines (immunizations).   This information is not intended to replace advice given to you by your health care provider. Make sure you discuss any questions you have with your health care provider.   Document Released: 09/04/2007 Document Revised: 03/29/2014 Document Reviewed: 08/03/2010 Elsevier Interactive Patient Education [employer].
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
## 2015-05-28 DIAGNOSIS — M1990 Unspecified osteoarthritis, unspecified site: Secondary | ICD-10-CM | POA: Diagnosis not present

## 2015-05-28 DIAGNOSIS — L97811 Non-pressure chronic ulcer of other part of right lower leg limited to breakdown of skin: Secondary | ICD-10-CM | POA: Diagnosis not present

## 2015-05-28 DIAGNOSIS — L97211 Non-pressure chronic ulcer of right calf limited to breakdown of skin: Secondary | ICD-10-CM | POA: Diagnosis not present

## 2015-05-28 DIAGNOSIS — L97821 Non-pressure chronic ulcer of other part of left lower leg limited to breakdown of skin: Secondary | ICD-10-CM | POA: Diagnosis not present

## 2015-05-28 DIAGNOSIS — I10 Essential (primary) hypertension: Secondary | ICD-10-CM | POA: Diagnosis not present

## 2015-05-28 DIAGNOSIS — I890 Lymphedema, not elsewhere classified: Secondary | ICD-10-CM | POA: Diagnosis not present

## 2015-05-28 DIAGNOSIS — L97221 Non-pressure chronic ulcer of left calf limited to breakdown of skin: Secondary | ICD-10-CM | POA: Diagnosis not present

## 2015-05-28 DIAGNOSIS — Z8673 Personal history of transient ischemic attack (TIA), and cerebral infarction without residual deficits: Secondary | ICD-10-CM | POA: Diagnosis not present

## 2015-05-30 ENCOUNTER — Encounter: Payer: Self-pay | Admitting: Podiatry

## 2015-05-30 ENCOUNTER — Ambulatory Visit (INDEPENDENT_AMBULATORY_CARE_PROVIDER_SITE_OTHER): Payer: Medicare Other | Admitting: Podiatry

## 2015-05-30 DIAGNOSIS — Z4800 Encounter for change or removal of nonsurgical wound dressing: Secondary | ICD-10-CM | POA: Diagnosis not present

## 2015-05-30 DIAGNOSIS — I110 Hypertensive heart disease with heart failure: Secondary | ICD-10-CM | POA: Diagnosis not present

## 2015-05-30 DIAGNOSIS — I872 Venous insufficiency (chronic) (peripheral): Secondary | ICD-10-CM | POA: Diagnosis not present

## 2015-05-30 DIAGNOSIS — Z86718 Personal history of other venous thrombosis and embolism: Secondary | ICD-10-CM | POA: Diagnosis not present

## 2015-05-30 DIAGNOSIS — M79673 Pain in unspecified foot: Secondary | ICD-10-CM

## 2015-05-30 DIAGNOSIS — D649 Anemia, unspecified: Secondary | ICD-10-CM | POA: Diagnosis not present

## 2015-05-30 DIAGNOSIS — Z79891 Long term (current) use of opiate analgesic: Secondary | ICD-10-CM | POA: Diagnosis not present

## 2015-05-30 DIAGNOSIS — B351 Tinea unguium: Secondary | ICD-10-CM

## 2015-05-30 DIAGNOSIS — Z8673 Personal history of transient ischemic attack (TIA), and cerebral infarction without residual deficits: Secondary | ICD-10-CM | POA: Diagnosis not present

## 2015-05-30 DIAGNOSIS — Z7982 Long term (current) use of aspirin: Secondary | ICD-10-CM | POA: Diagnosis not present

## 2015-05-30 DIAGNOSIS — I5030 Unspecified diastolic (congestive) heart failure: Secondary | ICD-10-CM | POA: Diagnosis not present

## 2015-05-30 DIAGNOSIS — M4322 Fusion of spine, cervical region: Secondary | ICD-10-CM | POA: Diagnosis not present
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
## 2015-06-02 DIAGNOSIS — I110 Hypertensive heart disease with heart failure: Secondary | ICD-10-CM | POA: Diagnosis not present

## 2015-06-02 DIAGNOSIS — D649 Anemia, unspecified: Secondary | ICD-10-CM | POA: Diagnosis not present

## 2015-06-02 DIAGNOSIS — Z4800 Encounter for change or removal of nonsurgical wound dressing: Secondary | ICD-10-CM | POA: Diagnosis not present

## 2015-06-02 DIAGNOSIS — I5030 Unspecified diastolic (congestive) heart failure: Secondary | ICD-10-CM | POA: Diagnosis not present

## 2015-06-02 DIAGNOSIS — Z79891 Long term (current) use of opiate analgesic: Secondary | ICD-10-CM | POA: Diagnosis not present

## 2015-06-02 DIAGNOSIS — M4322 Fusion of spine, cervical region: Secondary | ICD-10-CM | POA: Diagnosis not present

## 2015-06-02 DIAGNOSIS — I872 Venous insufficiency (chronic) (peripheral): Secondary | ICD-10-CM | POA: Diagnosis not present

## 2015-06-02 DIAGNOSIS — Z86718 Personal history of other venous thrombosis and embolism: Secondary | ICD-10-CM | POA: Diagnosis not present

## 2015-06-02 DIAGNOSIS — Z7982 Long term (current) use of aspirin: Secondary | ICD-10-CM | POA: Diagnosis not present

## 2015-06-02 DIAGNOSIS — Z8673 Personal history of transient ischemic attack (TIA), and cerebral infarction without residual deficits: Secondary | ICD-10-CM | POA: Diagnosis not present

## 2015-06-04 DIAGNOSIS — I890 Lymphedema, not elsewhere classified: Secondary | ICD-10-CM | POA: Diagnosis not present

## 2015-06-04 DIAGNOSIS — L97821 Non-pressure chronic ulcer of other part of left lower leg limited to breakdown of skin: Secondary | ICD-10-CM | POA: Diagnosis not present

## 2015-06-04 DIAGNOSIS — L97211 Non-pressure chronic ulcer of right calf limited to breakdown of skin: Secondary | ICD-10-CM | POA: Diagnosis not present

## 2015-06-04 DIAGNOSIS — M1990 Unspecified osteoarthritis, unspecified site: Secondary | ICD-10-CM | POA: Diagnosis not present

## 2015-06-04 DIAGNOSIS — L97811 Non-pressure chronic ulcer of other part of right lower leg limited to breakdown of skin: Secondary | ICD-10-CM | POA: Diagnosis not present

## 2015-06-04 DIAGNOSIS — I10 Essential (primary) hypertension: Secondary | ICD-10-CM | POA: Diagnosis not present

## 2015-06-04 DIAGNOSIS — Z8673 Personal history of transient ischemic attack (TIA), and cerebral infarction without residual deficits: Secondary | ICD-10-CM | POA: Diagnosis not present

## 2015-06-04 DIAGNOSIS — L97221 Non-pressure chronic ulcer of left calf limited to breakdown of skin: Secondary | ICD-10-CM | POA: Diagnosis not present

## 2015-06-05 ENCOUNTER — Telehealth: Payer: Self-pay

## 2015-06-05 DIAGNOSIS — I872 Venous insufficiency (chronic) (peripheral): Secondary | ICD-10-CM | POA: Diagnosis not present

## 2015-06-05 NOTE — Telephone Encounter (Signed)
Fax received and given to PCP for signature. Charge Capture Attached.  

## 2015-06-06 DIAGNOSIS — I872 Venous insufficiency (chronic) (peripheral): Secondary | ICD-10-CM | POA: Diagnosis not present

## 2015-06-06 DIAGNOSIS — I5030 Unspecified diastolic (congestive) heart failure: Secondary | ICD-10-CM | POA: Diagnosis not present

## 2015-06-06 DIAGNOSIS — D649 Anemia, unspecified: Secondary | ICD-10-CM | POA: Diagnosis not present

## 2015-06-06 DIAGNOSIS — I110 Hypertensive heart disease with heart failure: Secondary | ICD-10-CM | POA: Diagnosis not present

## 2015-06-06 DIAGNOSIS — Z7982 Long term (current) use of aspirin: Secondary | ICD-10-CM | POA: Diagnosis not present

## 2015-06-06 DIAGNOSIS — Z4800 Encounter for change or removal of nonsurgical wound dressing: Secondary | ICD-10-CM | POA: Diagnosis not present

## 2015-06-06 DIAGNOSIS — Z79891 Long term (current) use of opiate analgesic: Secondary | ICD-10-CM | POA: Diagnosis not present

## 2015-06-06 DIAGNOSIS — M4322 Fusion of spine, cervical region: Secondary | ICD-10-CM | POA: Diagnosis not present

## 2015-06-06 DIAGNOSIS — Z8673 Personal history of transient ischemic attack (TIA), and cerebral infarction without residual deficits: Secondary | ICD-10-CM | POA: Diagnosis not present

## 2015-06-06 DIAGNOSIS — Z86718 Personal history of other venous thrombosis and embolism: Secondary | ICD-10-CM | POA: Diagnosis not present

## 2015-06-09 DIAGNOSIS — I5030 Unspecified diastolic (congestive) heart failure: Secondary | ICD-10-CM | POA: Diagnosis not present

## 2015-06-09 DIAGNOSIS — Z79891 Long term (current) use of opiate analgesic: Secondary | ICD-10-CM | POA: Diagnosis not present

## 2015-06-09 DIAGNOSIS — Z86718 Personal history of other venous thrombosis and embolism: Secondary | ICD-10-CM | POA: Diagnosis not present

## 2015-06-09 DIAGNOSIS — I872 Venous insufficiency (chronic) (peripheral): Secondary | ICD-10-CM | POA: Diagnosis not present

## 2015-06-09 DIAGNOSIS — Z7982 Long term (current) use of aspirin: Secondary | ICD-10-CM | POA: Diagnosis not present

## 2015-06-09 DIAGNOSIS — Z4800 Encounter for change or removal of nonsurgical wound dressing: Secondary | ICD-10-CM | POA: Diagnosis not present

## 2015-06-09 DIAGNOSIS — M4322 Fusion of spine, cervical region: Secondary | ICD-10-CM | POA: Diagnosis not present

## 2015-06-09 DIAGNOSIS — I110 Hypertensive heart disease with heart failure: Secondary | ICD-10-CM | POA: Diagnosis not present

## 2015-06-09 DIAGNOSIS — Z8673 Personal history of transient ischemic attack (TIA), and cerebral infarction without residual deficits: Secondary | ICD-10-CM | POA: Diagnosis not present

## 2015-06-09 DIAGNOSIS — D649 Anemia, unspecified: Secondary | ICD-10-CM | POA: Diagnosis not present

## 2015-06-10 DIAGNOSIS — S81801A Unspecified open wound, right lower leg, initial encounter: Secondary | ICD-10-CM | POA: Diagnosis not present

## 2015-06-11 DIAGNOSIS — L97821 Non-pressure chronic ulcer of other part of left lower leg limited to breakdown of skin: Secondary | ICD-10-CM | POA: Diagnosis not present

## 2015-06-11 DIAGNOSIS — Z8673 Personal history of transient ischemic attack (TIA), and cerebral infarction without residual deficits: Secondary | ICD-10-CM | POA: Diagnosis not present

## 2015-06-11 DIAGNOSIS — L97221 Non-pressure chronic ulcer of left calf limited to breakdown of skin: Secondary | ICD-10-CM | POA: Diagnosis not present

## 2015-06-11 DIAGNOSIS — M1990 Unspecified osteoarthritis, unspecified site: Secondary | ICD-10-CM | POA: Diagnosis not present

## 2015-06-11 DIAGNOSIS — L97811 Non-pressure chronic ulcer of other part of right lower leg limited to breakdown of skin: Secondary | ICD-10-CM | POA: Diagnosis not present

## 2015-06-11 DIAGNOSIS — I890 Lymphedema, not elsewhere classified: Secondary | ICD-10-CM | POA: Diagnosis not present

## 2015-06-11 DIAGNOSIS — L97211 Non-pressure chronic ulcer of right calf limited to breakdown of skin: Secondary | ICD-10-CM | POA: Diagnosis not present

## 2015-06-11 DIAGNOSIS — I10 Essential (primary) hypertension: Secondary | ICD-10-CM | POA: Diagnosis not present

## 2015-06-12 ENCOUNTER — Encounter: Payer: Self-pay | Admitting: Gastroenterology

## 2015-06-13 DIAGNOSIS — Z79891 Long term (current) use of opiate analgesic: Secondary | ICD-10-CM | POA: Diagnosis not present

## 2015-06-13 DIAGNOSIS — Z7982 Long term (current) use of aspirin: Secondary | ICD-10-CM | POA: Diagnosis not present

## 2015-06-13 DIAGNOSIS — Z4800 Encounter for change or removal of nonsurgical wound dressing: Secondary | ICD-10-CM | POA: Diagnosis not present

## 2015-06-13 DIAGNOSIS — Z8673 Personal history of transient ischemic attack (TIA), and cerebral infarction without residual deficits: Secondary | ICD-10-CM | POA: Diagnosis not present

## 2015-06-13 DIAGNOSIS — Z86718 Personal history of other venous thrombosis and embolism: Secondary | ICD-10-CM | POA: Diagnosis not present

## 2015-06-13 DIAGNOSIS — I872 Venous insufficiency (chronic) (peripheral): Secondary | ICD-10-CM | POA: Diagnosis not present

## 2015-06-13 DIAGNOSIS — D649 Anemia, unspecified: Secondary | ICD-10-CM | POA: Diagnosis not present

## 2015-06-13 DIAGNOSIS — I5030 Unspecified diastolic (congestive) heart failure: Secondary | ICD-10-CM | POA: Diagnosis not present

## 2015-06-13 DIAGNOSIS — I110 Hypertensive heart disease with heart failure: Secondary | ICD-10-CM | POA: Diagnosis not present

## 2015-06-13 DIAGNOSIS — M4322 Fusion of spine, cervical region: Secondary | ICD-10-CM | POA: Diagnosis not present

## 2015-06-16 DIAGNOSIS — Z79891 Long term (current) use of opiate analgesic: Secondary | ICD-10-CM | POA: Diagnosis not present

## 2015-06-16 DIAGNOSIS — Z7982 Long term (current) use of aspirin: Secondary | ICD-10-CM | POA: Diagnosis not present

## 2015-06-16 DIAGNOSIS — I110 Hypertensive heart disease with heart failure: Secondary | ICD-10-CM | POA: Diagnosis not present

## 2015-06-16 DIAGNOSIS — D649 Anemia, unspecified: Secondary | ICD-10-CM | POA: Diagnosis not present

## 2015-06-16 DIAGNOSIS — Z86718 Personal history of other venous thrombosis and embolism: Secondary | ICD-10-CM | POA: Diagnosis not present

## 2015-06-16 DIAGNOSIS — I872 Venous insufficiency (chronic) (peripheral): Secondary | ICD-10-CM | POA: Diagnosis not present

## 2015-06-16 DIAGNOSIS — Z4800 Encounter for change or removal of nonsurgical wound dressing: Secondary | ICD-10-CM | POA: Diagnosis not present

## 2015-06-16 DIAGNOSIS — M4322 Fusion of spine, cervical region: Secondary | ICD-10-CM | POA: Diagnosis not present

## 2015-06-16 DIAGNOSIS — I5030 Unspecified diastolic (congestive) heart failure: Secondary | ICD-10-CM | POA: Diagnosis not present

## 2015-06-16 DIAGNOSIS — Z8673 Personal history of transient ischemic attack (TIA), and cerebral infarction without residual deficits: Secondary | ICD-10-CM | POA: Diagnosis not present

## 2015-06-17 NOTE — Telephone Encounter (Signed)
Paperwork signed, faxed, copy sent to scan 

## 2015-06-18 DIAGNOSIS — M1990 Unspecified osteoarthritis, unspecified site: Secondary | ICD-10-CM | POA: Diagnosis not present

## 2015-06-18 DIAGNOSIS — I890 Lymphedema, not elsewhere classified: Secondary | ICD-10-CM | POA: Diagnosis not present

## 2015-06-18 DIAGNOSIS — Z8673 Personal history of transient ischemic attack (TIA), and cerebral infarction without residual deficits: Secondary | ICD-10-CM | POA: Diagnosis not present

## 2015-06-18 DIAGNOSIS — L97211 Non-pressure chronic ulcer of right calf limited to breakdown of skin: Secondary | ICD-10-CM | POA: Diagnosis not present

## 2015-06-18 DIAGNOSIS — I10 Essential (primary) hypertension: Secondary | ICD-10-CM | POA: Diagnosis not present

## 2015-06-18 DIAGNOSIS — L97811 Non-pressure chronic ulcer of other part of right lower leg limited to breakdown of skin: Secondary | ICD-10-CM | POA: Diagnosis not present

## 2015-06-18 DIAGNOSIS — L97821 Non-pressure chronic ulcer of other part of left lower leg limited to breakdown of skin: Secondary | ICD-10-CM | POA: Diagnosis not present

## 2015-06-18 DIAGNOSIS — L97221 Non-pressure chronic ulcer of left calf limited to breakdown of skin: Secondary | ICD-10-CM | POA: Diagnosis not present

## 2015-06-20 DIAGNOSIS — Z8673 Personal history of transient ischemic attack (TIA), and cerebral infarction without residual deficits: Secondary | ICD-10-CM | POA: Diagnosis not present

## 2015-06-20 DIAGNOSIS — Z4800 Encounter for change or removal of nonsurgical wound dressing: Secondary | ICD-10-CM | POA: Diagnosis not present

## 2015-06-20 DIAGNOSIS — Z7982 Long term (current) use of aspirin: Secondary | ICD-10-CM | POA: Diagnosis not present

## 2015-06-20 DIAGNOSIS — Z86718 Personal history of other venous thrombosis and embolism: Secondary | ICD-10-CM | POA: Diagnosis not present

## 2015-06-20 DIAGNOSIS — Z79891 Long term (current) use of opiate analgesic: Secondary | ICD-10-CM | POA: Diagnosis not present

## 2015-06-20 DIAGNOSIS — I5030 Unspecified diastolic (congestive) heart failure: Secondary | ICD-10-CM | POA: Diagnosis not present

## 2015-06-20 DIAGNOSIS — M4322 Fusion of spine, cervical region: Secondary | ICD-10-CM | POA: Diagnosis not present

## 2015-06-20 DIAGNOSIS — D649 Anemia, unspecified: Secondary | ICD-10-CM | POA: Diagnosis not present

## 2015-06-20 DIAGNOSIS — I872 Venous insufficiency (chronic) (peripheral): Secondary | ICD-10-CM | POA: Diagnosis not present

## 2015-06-20 DIAGNOSIS — I110 Hypertensive heart disease with heart failure: Secondary | ICD-10-CM | POA: Diagnosis not present

## 2015-06-23 ENCOUNTER — Encounter: Payer: Self-pay | Admitting: Internal Medicine

## 2015-06-23 ENCOUNTER — Ambulatory Visit: Payer: Medicare Other | Admitting: Internal Medicine

## 2015-06-23 ENCOUNTER — Ambulatory Visit (INDEPENDENT_AMBULATORY_CARE_PROVIDER_SITE_OTHER): Payer: Medicare Other | Admitting: Internal Medicine

## 2015-06-23 VITALS — BP 150/72 | HR 74 | Ht 76.0 in | Wt 222.0 lb

## 2015-06-23 DIAGNOSIS — I872 Venous insufficiency (chronic) (peripheral): Secondary | ICD-10-CM | POA: Diagnosis not present

## 2015-06-23 DIAGNOSIS — Z79891 Long term (current) use of opiate analgesic: Secondary | ICD-10-CM | POA: Diagnosis not present

## 2015-06-23 DIAGNOSIS — M79604 Pain in right leg: Secondary | ICD-10-CM

## 2015-06-23 DIAGNOSIS — I674 Hypertensive encephalopathy: Secondary | ICD-10-CM | POA: Diagnosis not present

## 2015-06-23 DIAGNOSIS — Z0000 Encounter for general adult medical examination without abnormal findings: Secondary | ICD-10-CM | POA: Diagnosis not present

## 2015-06-23 DIAGNOSIS — E785 Hyperlipidemia, unspecified: Secondary | ICD-10-CM | POA: Diagnosis not present

## 2015-06-23 DIAGNOSIS — I10 Essential (primary) hypertension: Secondary | ICD-10-CM | POA: Diagnosis not present

## 2015-06-23 DIAGNOSIS — Z86718 Personal history of other venous thrombosis and embolism: Secondary | ICD-10-CM | POA: Diagnosis not present

## 2015-06-23 DIAGNOSIS — I110 Hypertensive heart disease with heart failure: Secondary | ICD-10-CM | POA: Diagnosis not present

## 2015-06-23 DIAGNOSIS — Z4800 Encounter for change or removal of nonsurgical wound dressing: Secondary | ICD-10-CM | POA: Diagnosis not present

## 2015-06-23 DIAGNOSIS — M545 Low back pain: Secondary | ICD-10-CM

## 2015-06-23 DIAGNOSIS — E559 Vitamin D deficiency, unspecified: Secondary | ICD-10-CM | POA: Diagnosis not present

## 2015-06-23 DIAGNOSIS — Z8673 Personal history of transient ischemic attack (TIA), and cerebral infarction without residual deficits: Secondary | ICD-10-CM | POA: Diagnosis not present

## 2015-06-23 DIAGNOSIS — C61 Malignant neoplasm of prostate: Secondary | ICD-10-CM

## 2015-06-23 DIAGNOSIS — Z7982 Long term (current) use of aspirin: Secondary | ICD-10-CM | POA: Diagnosis not present

## 2015-06-23 DIAGNOSIS — D649 Anemia, unspecified: Secondary | ICD-10-CM | POA: Diagnosis not present

## 2015-06-23 DIAGNOSIS — Z23 Encounter for immunization: Secondary | ICD-10-CM

## 2015-06-23 DIAGNOSIS — I5030 Unspecified diastolic (congestive) heart failure: Secondary | ICD-10-CM | POA: Diagnosis not present

## 2015-06-23 DIAGNOSIS — M4322 Fusion of spine, cervical region: Secondary | ICD-10-CM | POA: Diagnosis not present

## 2015-06-23 DIAGNOSIS — Z8546 Personal history of malignant neoplasm of prostate: Secondary | ICD-10-CM

## 2015-06-23 DIAGNOSIS — I6350 Cerebral infarction due to unspecified occlusion or stenosis of unspecified cerebral artery: Secondary | ICD-10-CM

## 2015-06-23 DIAGNOSIS — R609 Edema, unspecified: Secondary | ICD-10-CM

## 2015-06-23 MED ORDER — TRIAMCINOLONE ACETONIDE 0.1 % EX OINT: 1.0000 "application " | TOPICAL_OINTMENT | Freq: Two times a day (BID) | CUTANEOUS | Status: DC | PRN

## 2015-06-23 NOTE — Assessment & Plan Note (Signed)
Per [facility] Protein drinks

## 2015-06-23 NOTE — Assessment & Plan Note (Signed)
On Vit D 

## 2015-06-23 NOTE — Assessment & Plan Note (Signed)
Here for medicare wellness/physical  Diet: heart healthy  Physical activity: sedentary  Depression/mood screen: negative  Hearing: intact to whispered voice  Visual acuity: grossly normal, performs annual eye exam  ADLs: capable  Fall risk: moderate Home safety: good  Cognitive evaluation: intact to orientation, naming, recall and repetition  EOL planning: adv directives, full code/ I agree  I have personally reviewed and have noted  1. The patient's medical, surgical and social history  2. Their use of alcohol, tobacco or illicit drugs  3. Their current medications and supplements  4. The patient's functional ability including ADL's, fall risks, home safety risks and hearing or visual impairment.  5. Diet and physical activities  6. Evidence for depression or mood disorders 7. The roster of all physicians providing medical care to patient - is listed in the Snapshot section of the chart and reviewed today.    Today patient counseled on age appropriate routine health concerns for screening and prevention, each reviewed and up to date or declined. Immunizations reviewed and up to date or declined. Labs ordered and reviewed. Risk factors for depression reviewed and negative. Hearing function and visual acuity are intact. ADLs screened and addressed as needed. Functional ability and level of safety reviewed and appropriate. Education, counseling and referrals performed based on assessed risks today. Patient provided with a copy of personalized plan for preventive services.    

## 2015-06-23 NOTE — Assessment & Plan Note (Signed)
No relapse Torsemide, Labetalol, ASA

## 2015-06-23 NOTE — Assessment & Plan Note (Signed)
PSA

## 2015-06-23 NOTE — Assessment & Plan Note (Signed)
Labs

## 2015-06-23 NOTE — Assessment & Plan Note (Signed)
Torsemide, Labetalol

## 2015-06-23 NOTE — Progress Notes (Signed)
Subjective:  Patient ID: Kurt Sexton, male    DOB: 1941/03/13  Age: 75 y.o. MRN: [id number]  CC: Annual Exam   HPI DECOTA RUDGE presents for a well exam F/u LE swelling - [facility] Clinic 3/week  Outpatient Prescriptions Prior to Visit  Medication Sig Dispense Refill  . aspirin 81 MG EC tablet Take 81 mg by mouth daily.      . betamethasone dipropionate (DIPROLENE) 0.05 % cream Apply 1 application topically 2 (two) times daily. Reported on 05/27/2015    . Cholecalciferol (VITAMIN D3) 2000 units capsule Take 1 capsule (2,000 Units total) by mouth daily. 100 capsule 3  . ergocalciferol (VITAMIN D2) 50000 UNITS capsule Take 1 capsule (50,000 Units total) by mouth once a week. 6 capsule 0  . ferrous sulfate 325 (65 FE) MG tablet Take 1 tablet (325 mg total) by mouth 2 (two) times daily with a meal. 100 tablet 3  . hydrOXYzine (ATARAX/VISTARIL) 25 MG tablet Take 1 tablet (25 mg total) by mouth every 8 (eight) hours as needed for itching or nausea. 60 tablet 1  . labetalol (NORMODYNE) 200 MG tablet Take 1 tablet (200 mg total) by mouth 3 (three) times daily. 180 tablet 3  . Magnesium Hydroxide (MILK OF MAGNESIA PO) Take by mouth. If no BM in 3 days give 30 cc milk of magnesium p.o. 1 dose in 24 hours as needed    . meclizine (ANTIVERT) 12.5 MG tablet Take 12.5 mg by mouth every 6 (six) hours as needed for dizziness.    . torsemide (DEMADEX) 20 MG tablet Take 1 tablet (20 mg total) by mouth daily. 30 tablet 2  . triamcinolone ointment (KENALOG) 0.1 % Apply 1 application topically 2 (two) times daily as needed (skin irritation).      No facility-administered medications prior to visit.    ROS Review of Systems  Constitutional: Negative for appetite change, fatigue and unexpected weight change.  HENT: Negative for congestion, nosebleeds, sneezing, sore throat and trouble swallowing.   Eyes: Negative for itching and visual disturbance.  Respiratory: Negative for cough.   Cardiovascular:  Positive for leg swelling. Negative for chest pain and palpitations.  Gastrointestinal: Negative for nausea, diarrhea, blood in stool and abdominal distention.  Genitourinary: Negative for frequency and hematuria.  Musculoskeletal: Positive for back pain and gait problem. Negative for joint swelling and neck pain.  Skin: Positive for wound. Negative for rash.  Neurological: Positive for weakness. Negative for dizziness, tremors and speech difficulty.  Psychiatric/Behavioral: Negative for suicidal ideas, sleep disturbance, dysphoric mood and agitation. The patient is not nervous/anxious.     Objective:  BP 150/72 mmHg  Pulse 74  Ht 6\' 4"  (1.93 m)  Wt 222 lb (100.699 kg)  BMI 27.03 kg/m2  SpO2 96%  BP Readings from Last 3 Encounters:  06/23/15 150/72  05/27/15 164/70  04/23/15 150/60    Wt Readings from Last 3 Encounters:  06/23/15 222 lb (100.699 kg)  05/27/15 222 lb 6 oz (100.869 kg)  04/23/15 218 lb (98.884 kg)    Physical Exam  Constitutional: He is oriented to person, place, and time. He appears well-developed. No distress.  NAD  HENT:  Mouth/Throat: Oropharynx is clear and moist.  Eyes: Conjunctivae are normal. Pupils are equal, round, and reactive to light.  Neck: Normal range of motion. No JVD present. No thyromegaly present.  Cardiovascular: Normal rate, regular rhythm, normal heart sounds and intact distal pulses.  Exam reveals no gallop and no friction rub.  No murmur heard. Pulmonary/Chest: Effort normal and breath sounds normal. No respiratory distress. He has no wheezes. He has no rales. He exhibits no tenderness.  Abdominal: Soft. Bowel sounds are normal. He exhibits no distension and no mass. There is no tenderness. There is no rebound and no guarding.  Musculoskeletal: Normal range of motion. He exhibits edema and tenderness.  Lymphadenopathy:    He has no cervical adenopathy.  Neurological: He is alert and oriented to person, place, and time. He has normal  reflexes. No cranial nerve deficit. He exhibits normal muscle tone. He displays a negative Romberg sign. Coordination and gait normal.  Skin: Skin is warm and dry. No rash noted.  Psychiatric: He has a normal mood and affect. His behavior is normal. Judgment and thought content normal.    Lab Results  Component Value Date   WBC 5.6 04/17/2015   HGB 10.0* 04/17/2015   HCT 32.5* 04/17/2015   PLT 224 04/17/2015   GLUCOSE 107* 04/17/2015   CHOL 208* 12/05/2013   TRIG 111.0 12/05/2013   HDL 35.30* 12/05/2013   LDLCALC 151* 12/05/2013   ALT 15* 04/16/2015   AST 20 04/16/2015   NA 142 04/17/2015   K 4.1 04/17/2015   CL 111 04/17/2015   CREATININE 0.96 04/17/2015   BUN 9 04/17/2015   CO2 24 04/17/2015   TSH 1.76 03/12/2015   PSA 13.26* 07/10/2014   INR 1.01 10/25/2010   HGBA1C 5.2 03/27/2014    Mr Lumbar Spine Wo Contrast  04/18/2015  CLINICAL DATA:  Bilateral leg weakness with hyperreflexia. Gait instability. EXAM: MRI LUMBAR SPINE WITHOUT CONTRAST TECHNIQUE: Multiplanar, multisequence MR imaging of the lumbar spine was performed. No intravenous contrast was administered. COMPARISON:  CT scan of the abdomen and pelvis dated 10/24/2014 and lumbar MRI dated 10/18/2008 FINDINGS: Normal conus tip at L1.  Normal paraspinal soft tissues. T12-L1:  Normal. L1-2: Increased broad-based soft disc bulge with a new focal soft disc extrusion into the left lateral recess and proximal left neural foramen. New hypertrophy of the left ligamentum flavum. This should affect the left L1 and L2 nerves. The thecal sac is slightly compressed. L2-3: Small broad-based soft disc protrusion extending foraminal and extra foraminal, unchanged. Hypertrophy of the facet joints and ligamentum flavum create moderately severe spinal stenosis and bilateral lateral recess compression, right more than left. L3-4: Broad-based soft disc protrusion extending foraminal and extra foraminal on the right with a slight mass effect upon  the right L3 nerve lateral to the neural foramen. The protrusion is slightly more prominent than on the prior study. Increased bilateral facet arthritis and hypertrophy narrowing both lateral recesses, right more than left. L4-5: 3 mm spondylolisthesis due to severe bilateral facet arthritis. This has slightly increased since the prior study. Severe spinal stenosis due to hypertrophy of the ligamentum flavum and facet joints with marked compression of the lateral recesses which should affect both L5 nerves. Moderately severe right foraminal stenosis which might affect the right L4 nerve. L5-S1: Normal disc. Minimal degenerative changes of the right facet joint. IMPRESSION: 1. Severe spinal stenosis at L4-5, slightly increased since the prior study with slightly increased grade 1 spondylolisthesis. 2. Increased disc protrusion at L3-4 far lateral on the right with a slight mass effect upon the right L3 nerve lateral to the neural foramen. 3. New soft disc extrusion into the left lateral recess and left neural foramen at L1-2 which could affect the left L1 and L2 nerves. 4. Chronic moderately severe spinal stenosis at L2-3.  Electronically Signed   By: Lorriane Shire M.D.   On: 04/18/2015 13:50    Assessment & Plan:   There are no diagnoses linked to this encounter. I am having Mr. Spiegelman maintain his aspirin, hydrOXYzine, betamethasone dipropionate, triamcinolone ointment, labetalol, meclizine, Magnesium Hydroxide (MILK OF MAGNESIA PO), ergocalciferol, ferrous sulfate, Vitamin D3, and torsemide.  No orders of the defined types were placed in this encounter.     Follow-up: No Follow-up on file.  [name], MD

## 2015-06-23 NOTE — Assessment & Plan Note (Signed)
PSA Urol appt next month

## 2015-06-23 NOTE — Patient Instructions (Signed)

## 2015-06-23 NOTE — Progress Notes (Signed)
Pre visit review using our clinic review tool, if applicable. No additional management support is needed unless otherwise documented below in the visit note. 

## 2015-06-23 NOTE — Assessment & Plan Note (Signed)
Doing ok.

## 2015-06-24 DIAGNOSIS — S81801A Unspecified open wound, right lower leg, initial encounter: Secondary | ICD-10-CM | POA: Diagnosis not present

## 2015-06-25 ENCOUNTER — Encounter (HOSPITAL_BASED_OUTPATIENT_CLINIC_OR_DEPARTMENT_OTHER): Payer: Medicare Other | Attending: Surgery

## 2015-06-25 ENCOUNTER — Other Ambulatory Visit (INDEPENDENT_AMBULATORY_CARE_PROVIDER_SITE_OTHER): Payer: Medicare Other

## 2015-06-25 DIAGNOSIS — E785 Hyperlipidemia, unspecified: Secondary | ICD-10-CM

## 2015-06-25 DIAGNOSIS — L97211 Non-pressure chronic ulcer of right calf limited to breakdown of skin: Secondary | ICD-10-CM | POA: Diagnosis not present

## 2015-06-25 DIAGNOSIS — E559 Vitamin D deficiency, unspecified: Secondary | ICD-10-CM

## 2015-06-25 DIAGNOSIS — X58XXXA Exposure to other specified factors, initial encounter: Secondary | ICD-10-CM | POA: Diagnosis not present

## 2015-06-25 DIAGNOSIS — L97821 Non-pressure chronic ulcer of other part of left lower leg limited to breakdown of skin: Secondary | ICD-10-CM | POA: Diagnosis not present

## 2015-06-25 DIAGNOSIS — S91301A Unspecified open wound, right foot, initial encounter: Secondary | ICD-10-CM | POA: Diagnosis not present

## 2015-06-25 DIAGNOSIS — S91302A Unspecified open wound, left foot, initial encounter: Secondary | ICD-10-CM | POA: Insufficient documentation

## 2015-06-25 DIAGNOSIS — Z8546 Personal history of malignant neoplasm of prostate: Secondary | ICD-10-CM | POA: Insufficient documentation

## 2015-06-25 DIAGNOSIS — L97811 Non-pressure chronic ulcer of other part of right lower leg limited to breakdown of skin: Secondary | ICD-10-CM | POA: Insufficient documentation

## 2015-06-25 DIAGNOSIS — Z8673 Personal history of transient ischemic attack (TIA), and cerebral infarction without residual deficits: Secondary | ICD-10-CM | POA: Diagnosis not present

## 2015-06-25 DIAGNOSIS — I739 Peripheral vascular disease, unspecified: Secondary | ICD-10-CM | POA: Insufficient documentation

## 2015-06-25 DIAGNOSIS — L989 Disorder of the skin and subcutaneous tissue, unspecified: Secondary | ICD-10-CM | POA: Insufficient documentation

## 2015-06-25 DIAGNOSIS — I890 Lymphedema, not elsewhere classified: Secondary | ICD-10-CM | POA: Insufficient documentation

## 2015-06-25 DIAGNOSIS — I10 Essential (primary) hypertension: Secondary | ICD-10-CM | POA: Insufficient documentation

## 2015-06-25 DIAGNOSIS — R609 Edema, unspecified: Secondary | ICD-10-CM | POA: Diagnosis not present

## 2015-06-25 DIAGNOSIS — Z0000 Encounter for general adult medical examination without abnormal findings: Secondary | ICD-10-CM

## 2015-06-25 DIAGNOSIS — L97221 Non-pressure chronic ulcer of left calf limited to breakdown of skin: Secondary | ICD-10-CM | POA: Diagnosis not present

## 2015-06-25 DIAGNOSIS — M109 Gout, unspecified: Secondary | ICD-10-CM | POA: Diagnosis not present

## 2015-06-25 LAB — HEPATIC FUNCTION PANEL
ALT: 14 U/L (ref 0–53)
AST: 15 U/L (ref 0–37)
Albumin: 3.8 g/dL (ref 3.5–5.2)
Alkaline Phosphatase: 117 U/L (ref 39–117)
Bilirubin, Direct: 0.1 mg/dL (ref 0.0–0.3)
Total Bilirubin: 0.8 mg/dL (ref 0.2–1.2)
Total Protein: 7.2 g/dL (ref 6.0–8.3)

## 2015-06-25 LAB — CBC WITH DIFFERENTIAL/PLATELET
Basophils Absolute: 0.1 K/uL (ref 0.0–0.1)
Basophils Relative: 1.5 % (ref 0.0–3.0)
Eosinophils Absolute: 1.2 K/uL — ABNORMAL HIGH (ref 0.0–0.7)
Eosinophils Relative: 21.4 % — ABNORMAL HIGH (ref 0.0–5.0)
HCT: 35 % — ABNORMAL LOW (ref 39.0–52.0)
Hemoglobin: 11.1 g/dL — ABNORMAL LOW (ref 13.0–17.0)
Lymphocytes Relative: 21.5 % (ref 12.0–46.0)
Lymphs Abs: 1.2 K/uL (ref 0.7–4.0)
MCHC: 31.9 g/dL (ref 30.0–36.0)
MCV: 82 fl (ref 78.0–100.0)
Monocytes Absolute: 0.7 K/uL (ref 0.1–1.0)
Monocytes Relative: 11.2 % (ref 3.0–12.0)
Neutro Abs: 2.6 K/uL (ref 1.4–7.7)
Neutrophils Relative %: 44.4 % (ref 43.0–77.0)
Platelets: 287 K/uL (ref 150.0–400.0)
RBC: 4.26 Mil/uL (ref 4.22–5.81)
RDW: 13.2 % (ref 11.5–15.5)
WBC: 5.8 K/uL (ref 4.0–10.5)

## 2015-06-25 LAB — LIPID PANEL
Cholesterol: 198 mg/dL (ref 0–200)
HDL: 39.1 mg/dL (ref >39.00)
LDL Cholesterol: 135 mg/dL — ABNORMAL HIGH (ref 0–99)
NonHDL: 159.13
Total CHOL/HDL Ratio: 5
Triglycerides: 121 mg/dL (ref 0.0–149.0)
VLDL: 24.2 mg/dL (ref 0.0–40.0)

## 2015-06-25 LAB — BASIC METABOLIC PANEL
BUN: 17 mg/dL (ref 6–23)
CHLORIDE: 104 meq/L (ref 96–112)
CO2: 32 meq/L (ref 19–32)
Calcium: 9.4 mg/dL (ref 8.4–10.5)
Creatinine, Ser: 1.11 mg/dL (ref 0.40–1.50)
GFR: 82.87 mL/min (ref >60.00)
GLUCOSE: 110 mg/dL — AB (ref 70–99)
POTASSIUM: 4.5 meq/L (ref 3.5–5.1)
SODIUM: 141 meq/L (ref 135–145)

## 2015-06-25 LAB — URINALYSIS
Bilirubin Urine: NEGATIVE
Hgb urine dipstick: NEGATIVE
Ketones, ur: NEGATIVE
Leukocytes, UA: NEGATIVE
Nitrite: NEGATIVE
Specific Gravity, Urine: 1.02 (ref 1.000–1.030)
Total Protein, Urine: NEGATIVE
Urine Glucose: NEGATIVE
Urobilinogen, UA: 0.2 (ref 0.0–1.0)
pH: 6 (ref 5.0–8.0)

## 2015-06-25 LAB — PSA: PSA: 10.85 ng/mL — ABNORMAL HIGH (ref 0.10–4.00)

## 2015-06-25 LAB — TSH: TSH: 2.49 u[IU]/mL (ref 0.35–4.50)

## 2015-06-25 LAB — HEMOGLOBIN A1C: Hgb A1c MFr Bld: 5.2 % (ref 4.6–6.5)

## 2015-06-27 DIAGNOSIS — Z4800 Encounter for change or removal of nonsurgical wound dressing: Secondary | ICD-10-CM | POA: Diagnosis not present

## 2015-06-27 DIAGNOSIS — I5030 Unspecified diastolic (congestive) heart failure: Secondary | ICD-10-CM | POA: Diagnosis not present

## 2015-06-27 DIAGNOSIS — Z8673 Personal history of transient ischemic attack (TIA), and cerebral infarction without residual deficits: Secondary | ICD-10-CM | POA: Diagnosis not present

## 2015-06-27 DIAGNOSIS — Z79891 Long term (current) use of opiate analgesic: Secondary | ICD-10-CM | POA: Diagnosis not present

## 2015-06-27 DIAGNOSIS — I110 Hypertensive heart disease with heart failure: Secondary | ICD-10-CM | POA: Diagnosis not present

## 2015-06-27 DIAGNOSIS — I872 Venous insufficiency (chronic) (peripheral): Secondary | ICD-10-CM | POA: Diagnosis not present

## 2015-06-27 DIAGNOSIS — Z7982 Long term (current) use of aspirin: Secondary | ICD-10-CM | POA: Diagnosis not present

## 2015-06-27 DIAGNOSIS — D649 Anemia, unspecified: Secondary | ICD-10-CM | POA: Diagnosis not present

## 2015-06-27 DIAGNOSIS — Z86718 Personal history of other venous thrombosis and embolism: Secondary | ICD-10-CM | POA: Diagnosis not present

## 2015-06-27 DIAGNOSIS — M4322 Fusion of spine, cervical region: Secondary | ICD-10-CM | POA: Diagnosis not present

## 2015-06-30 DIAGNOSIS — D649 Anemia, unspecified: Secondary | ICD-10-CM | POA: Diagnosis not present

## 2015-06-30 DIAGNOSIS — I110 Hypertensive heart disease with heart failure: Secondary | ICD-10-CM | POA: Diagnosis not present

## 2015-06-30 DIAGNOSIS — I5030 Unspecified diastolic (congestive) heart failure: Secondary | ICD-10-CM | POA: Diagnosis not present

## 2015-06-30 DIAGNOSIS — Z7982 Long term (current) use of aspirin: Secondary | ICD-10-CM | POA: Diagnosis not present

## 2015-06-30 DIAGNOSIS — Z86718 Personal history of other venous thrombosis and embolism: Secondary | ICD-10-CM | POA: Diagnosis not present

## 2015-06-30 DIAGNOSIS — Z8673 Personal history of transient ischemic attack (TIA), and cerebral infarction without residual deficits: Secondary | ICD-10-CM | POA: Diagnosis not present

## 2015-06-30 DIAGNOSIS — M4322 Fusion of spine, cervical region: Secondary | ICD-10-CM | POA: Diagnosis not present

## 2015-06-30 DIAGNOSIS — Z79891 Long term (current) use of opiate analgesic: Secondary | ICD-10-CM | POA: Diagnosis not present

## 2015-06-30 DIAGNOSIS — Z4800 Encounter for change or removal of nonsurgical wound dressing: Secondary | ICD-10-CM | POA: Diagnosis not present

## 2015-06-30 DIAGNOSIS — I872 Venous insufficiency (chronic) (peripheral): Secondary | ICD-10-CM | POA: Diagnosis not present

## 2015-07-02 DIAGNOSIS — L989 Disorder of the skin and subcutaneous tissue, unspecified: Secondary | ICD-10-CM | POA: Diagnosis not present

## 2015-07-02 DIAGNOSIS — Z8673 Personal history of transient ischemic attack (TIA), and cerebral infarction without residual deficits: Secondary | ICD-10-CM | POA: Diagnosis not present

## 2015-07-02 DIAGNOSIS — I10 Essential (primary) hypertension: Secondary | ICD-10-CM | POA: Diagnosis not present

## 2015-07-02 DIAGNOSIS — L97211 Non-pressure chronic ulcer of right calf limited to breakdown of skin: Secondary | ICD-10-CM | POA: Diagnosis not present

## 2015-07-02 DIAGNOSIS — L97821 Non-pressure chronic ulcer of other part of left lower leg limited to breakdown of skin: Secondary | ICD-10-CM | POA: Diagnosis not present

## 2015-07-02 DIAGNOSIS — M109 Gout, unspecified: Secondary | ICD-10-CM | POA: Diagnosis not present

## 2015-07-02 DIAGNOSIS — S91302A Unspecified open wound, left foot, initial encounter: Secondary | ICD-10-CM | POA: Diagnosis not present

## 2015-07-02 DIAGNOSIS — S91301A Unspecified open wound, right foot, initial encounter: Secondary | ICD-10-CM | POA: Diagnosis not present

## 2015-07-02 DIAGNOSIS — L97221 Non-pressure chronic ulcer of left calf limited to breakdown of skin: Secondary | ICD-10-CM | POA: Diagnosis not present

## 2015-07-02 DIAGNOSIS — I739 Peripheral vascular disease, unspecified: Secondary | ICD-10-CM | POA: Diagnosis not present

## 2015-07-02 DIAGNOSIS — E785 Hyperlipidemia, unspecified: Secondary | ICD-10-CM | POA: Diagnosis not present

## 2015-07-02 DIAGNOSIS — L97811 Non-pressure chronic ulcer of other part of right lower leg limited to breakdown of skin: Secondary | ICD-10-CM | POA: Diagnosis not present

## 2015-07-02 DIAGNOSIS — I890 Lymphedema, not elsewhere classified: Secondary | ICD-10-CM | POA: Diagnosis not present

## 2015-07-04 DIAGNOSIS — D649 Anemia, unspecified: Secondary | ICD-10-CM | POA: Diagnosis not present

## 2015-07-04 DIAGNOSIS — M4322 Fusion of spine, cervical region: Secondary | ICD-10-CM | POA: Diagnosis not present

## 2015-07-04 DIAGNOSIS — I5030 Unspecified diastolic (congestive) heart failure: Secondary | ICD-10-CM | POA: Diagnosis not present

## 2015-07-04 DIAGNOSIS — Z4800 Encounter for change or removal of nonsurgical wound dressing: Secondary | ICD-10-CM | POA: Diagnosis not present

## 2015-07-04 DIAGNOSIS — I110 Hypertensive heart disease with heart failure: Secondary | ICD-10-CM | POA: Diagnosis not present

## 2015-07-04 DIAGNOSIS — Z86718 Personal history of other venous thrombosis and embolism: Secondary | ICD-10-CM | POA: Diagnosis not present

## 2015-07-04 DIAGNOSIS — Z7982 Long term (current) use of aspirin: Secondary | ICD-10-CM | POA: Diagnosis not present

## 2015-07-04 DIAGNOSIS — I872 Venous insufficiency (chronic) (peripheral): Secondary | ICD-10-CM | POA: Diagnosis not present

## 2015-07-04 DIAGNOSIS — Z79891 Long term (current) use of opiate analgesic: Secondary | ICD-10-CM | POA: Diagnosis not present

## 2015-07-04 DIAGNOSIS — Z8673 Personal history of transient ischemic attack (TIA), and cerebral infarction without residual deficits: Secondary | ICD-10-CM | POA: Diagnosis not present

## 2015-07-07 ENCOUNTER — Other Ambulatory Visit: Payer: Self-pay

## 2015-07-07 DIAGNOSIS — I110 Hypertensive heart disease with heart failure: Secondary | ICD-10-CM | POA: Diagnosis not present

## 2015-07-07 DIAGNOSIS — I5030 Unspecified diastolic (congestive) heart failure: Secondary | ICD-10-CM | POA: Diagnosis not present

## 2015-07-07 DIAGNOSIS — Z79891 Long term (current) use of opiate analgesic: Secondary | ICD-10-CM | POA: Diagnosis not present

## 2015-07-07 DIAGNOSIS — I872 Venous insufficiency (chronic) (peripheral): Secondary | ICD-10-CM | POA: Diagnosis not present

## 2015-07-07 DIAGNOSIS — Z8673 Personal history of transient ischemic attack (TIA), and cerebral infarction without residual deficits: Secondary | ICD-10-CM | POA: Diagnosis not present

## 2015-07-07 DIAGNOSIS — Z7982 Long term (current) use of aspirin: Secondary | ICD-10-CM | POA: Diagnosis not present

## 2015-07-07 DIAGNOSIS — M4322 Fusion of spine, cervical region: Secondary | ICD-10-CM | POA: Diagnosis not present

## 2015-07-07 DIAGNOSIS — Z86718 Personal history of other venous thrombosis and embolism: Secondary | ICD-10-CM | POA: Diagnosis not present

## 2015-07-07 DIAGNOSIS — D649 Anemia, unspecified: Secondary | ICD-10-CM | POA: Diagnosis not present

## 2015-07-07 DIAGNOSIS — Z4800 Encounter for change or removal of nonsurgical wound dressing: Secondary | ICD-10-CM | POA: Diagnosis not present

## 2015-07-07 MED ORDER — TRIAMCINOLONE ACETONIDE 0.1 % EX OINT: 1.0000 "application " | TOPICAL_OINTMENT | Freq: Two times a day (BID) | CUTANEOUS | Status: DC | PRN

## 2015-07-09 DIAGNOSIS — M109 Gout, unspecified: Secondary | ICD-10-CM | POA: Diagnosis not present

## 2015-07-09 DIAGNOSIS — I739 Peripheral vascular disease, unspecified: Secondary | ICD-10-CM | POA: Diagnosis not present

## 2015-07-09 DIAGNOSIS — L989 Disorder of the skin and subcutaneous tissue, unspecified: Secondary | ICD-10-CM | POA: Diagnosis not present

## 2015-07-09 DIAGNOSIS — L97811 Non-pressure chronic ulcer of other part of right lower leg limited to breakdown of skin: Secondary | ICD-10-CM | POA: Diagnosis not present

## 2015-07-09 DIAGNOSIS — I10 Essential (primary) hypertension: Secondary | ICD-10-CM | POA: Diagnosis not present

## 2015-07-09 DIAGNOSIS — S81801A Unspecified open wound, right lower leg, initial encounter: Secondary | ICD-10-CM | POA: Diagnosis not present

## 2015-07-09 DIAGNOSIS — Z8673 Personal history of transient ischemic attack (TIA), and cerebral infarction without residual deficits: Secondary | ICD-10-CM | POA: Diagnosis not present

## 2015-07-09 DIAGNOSIS — S91302A Unspecified open wound, left foot, initial encounter: Secondary | ICD-10-CM | POA: Diagnosis not present

## 2015-07-09 DIAGNOSIS — L97821 Non-pressure chronic ulcer of other part of left lower leg limited to breakdown of skin: Secondary | ICD-10-CM | POA: Diagnosis not present

## 2015-07-09 DIAGNOSIS — L97211 Non-pressure chronic ulcer of right calf limited to breakdown of skin: Secondary | ICD-10-CM | POA: Diagnosis not present

## 2015-07-09 DIAGNOSIS — S91301A Unspecified open wound, right foot, initial encounter: Secondary | ICD-10-CM | POA: Diagnosis not present

## 2015-07-09 DIAGNOSIS — I890 Lymphedema, not elsewhere classified: Secondary | ICD-10-CM | POA: Diagnosis not present

## 2015-07-09 DIAGNOSIS — E785 Hyperlipidemia, unspecified: Secondary | ICD-10-CM | POA: Diagnosis not present

## 2015-07-09 DIAGNOSIS — L97221 Non-pressure chronic ulcer of left calf limited to breakdown of skin: Secondary | ICD-10-CM | POA: Diagnosis not present

## 2015-07-11 DIAGNOSIS — I110 Hypertensive heart disease with heart failure: Secondary | ICD-10-CM | POA: Diagnosis not present

## 2015-07-11 DIAGNOSIS — Z8673 Personal history of transient ischemic attack (TIA), and cerebral infarction without residual deficits: Secondary | ICD-10-CM | POA: Diagnosis not present

## 2015-07-11 DIAGNOSIS — D649 Anemia, unspecified: Secondary | ICD-10-CM | POA: Diagnosis not present

## 2015-07-11 DIAGNOSIS — M4322 Fusion of spine, cervical region: Secondary | ICD-10-CM | POA: Diagnosis not present

## 2015-07-11 DIAGNOSIS — Z79891 Long term (current) use of opiate analgesic: Secondary | ICD-10-CM | POA: Diagnosis not present

## 2015-07-11 DIAGNOSIS — Z7982 Long term (current) use of aspirin: Secondary | ICD-10-CM | POA: Diagnosis not present

## 2015-07-11 DIAGNOSIS — I872 Venous insufficiency (chronic) (peripheral): Secondary | ICD-10-CM | POA: Diagnosis not present

## 2015-07-11 DIAGNOSIS — I5030 Unspecified diastolic (congestive) heart failure: Secondary | ICD-10-CM | POA: Diagnosis not present

## 2015-07-11 DIAGNOSIS — Z86718 Personal history of other venous thrombosis and embolism: Secondary | ICD-10-CM | POA: Diagnosis not present

## 2015-07-11 DIAGNOSIS — Z4800 Encounter for change or removal of nonsurgical wound dressing: Secondary | ICD-10-CM | POA: Diagnosis not present

## 2015-07-16 DIAGNOSIS — E785 Hyperlipidemia, unspecified: Secondary | ICD-10-CM | POA: Diagnosis not present

## 2015-07-16 DIAGNOSIS — M109 Gout, unspecified: Secondary | ICD-10-CM | POA: Diagnosis not present

## 2015-07-16 DIAGNOSIS — I10 Essential (primary) hypertension: Secondary | ICD-10-CM | POA: Diagnosis not present

## 2015-07-16 DIAGNOSIS — L97211 Non-pressure chronic ulcer of right calf limited to breakdown of skin: Secondary | ICD-10-CM | POA: Diagnosis not present

## 2015-07-16 DIAGNOSIS — L97821 Non-pressure chronic ulcer of other part of left lower leg limited to breakdown of skin: Secondary | ICD-10-CM | POA: Diagnosis not present

## 2015-07-16 DIAGNOSIS — I739 Peripheral vascular disease, unspecified: Secondary | ICD-10-CM | POA: Diagnosis not present

## 2015-07-16 DIAGNOSIS — L97221 Non-pressure chronic ulcer of left calf limited to breakdown of skin: Secondary | ICD-10-CM | POA: Diagnosis not present

## 2015-07-16 DIAGNOSIS — S91302A Unspecified open wound, left foot, initial encounter: Secondary | ICD-10-CM | POA: Diagnosis not present

## 2015-07-16 DIAGNOSIS — L989 Disorder of the skin and subcutaneous tissue, unspecified: Secondary | ICD-10-CM | POA: Diagnosis not present

## 2015-07-16 DIAGNOSIS — L97811 Non-pressure chronic ulcer of other part of right lower leg limited to breakdown of skin: Secondary | ICD-10-CM | POA: Diagnosis not present

## 2015-07-16 DIAGNOSIS — Z8673 Personal history of transient ischemic attack (TIA), and cerebral infarction without residual deficits: Secondary | ICD-10-CM | POA: Diagnosis not present

## 2015-07-16 DIAGNOSIS — S91301A Unspecified open wound, right foot, initial encounter: Secondary | ICD-10-CM | POA: Diagnosis not present

## 2015-07-16 DIAGNOSIS — I890 Lymphedema, not elsewhere classified: Secondary | ICD-10-CM | POA: Diagnosis not present

## 2015-07-19 DIAGNOSIS — Z79891 Long term (current) use of opiate analgesic: Secondary | ICD-10-CM | POA: Diagnosis not present

## 2015-07-19 DIAGNOSIS — M4322 Fusion of spine, cervical region: Secondary | ICD-10-CM | POA: Diagnosis not present

## 2015-07-19 DIAGNOSIS — I110 Hypertensive heart disease with heart failure: Secondary | ICD-10-CM | POA: Diagnosis not present

## 2015-07-19 DIAGNOSIS — I872 Venous insufficiency (chronic) (peripheral): Secondary | ICD-10-CM | POA: Diagnosis not present

## 2015-07-19 DIAGNOSIS — Z8673 Personal history of transient ischemic attack (TIA), and cerebral infarction without residual deficits: Secondary | ICD-10-CM | POA: Diagnosis not present

## 2015-07-19 DIAGNOSIS — Z4800 Encounter for change or removal of nonsurgical wound dressing: Secondary | ICD-10-CM | POA: Diagnosis not present

## 2015-07-19 DIAGNOSIS — I5030 Unspecified diastolic (congestive) heart failure: Secondary | ICD-10-CM | POA: Diagnosis not present

## 2015-07-19 DIAGNOSIS — Z7982 Long term (current) use of aspirin: Secondary | ICD-10-CM | POA: Diagnosis not present

## 2015-07-19 DIAGNOSIS — Z86718 Personal history of other venous thrombosis and embolism: Secondary | ICD-10-CM | POA: Diagnosis not present

## 2015-07-21 DIAGNOSIS — M4322 Fusion of spine, cervical region: Secondary | ICD-10-CM | POA: Diagnosis not present

## 2015-07-21 DIAGNOSIS — Z7982 Long term (current) use of aspirin: Secondary | ICD-10-CM | POA: Diagnosis not present

## 2015-07-21 DIAGNOSIS — I5030 Unspecified diastolic (congestive) heart failure: Secondary | ICD-10-CM | POA: Diagnosis not present

## 2015-07-21 DIAGNOSIS — Z79891 Long term (current) use of opiate analgesic: Secondary | ICD-10-CM | POA: Diagnosis not present

## 2015-07-21 DIAGNOSIS — Z8673 Personal history of transient ischemic attack (TIA), and cerebral infarction without residual deficits: Secondary | ICD-10-CM | POA: Diagnosis not present

## 2015-07-21 DIAGNOSIS — Z86718 Personal history of other venous thrombosis and embolism: Secondary | ICD-10-CM | POA: Diagnosis not present

## 2015-07-21 DIAGNOSIS — I872 Venous insufficiency (chronic) (peripheral): Secondary | ICD-10-CM | POA: Diagnosis not present

## 2015-07-21 DIAGNOSIS — I110 Hypertensive heart disease with heart failure: Secondary | ICD-10-CM | POA: Diagnosis not present

## 2015-07-21 DIAGNOSIS — Z4800 Encounter for change or removal of nonsurgical wound dressing: Secondary | ICD-10-CM | POA: Diagnosis not present

## 2015-07-23 ENCOUNTER — Telehealth: Payer: Self-pay

## 2015-07-23 ENCOUNTER — Encounter (HOSPITAL_BASED_OUTPATIENT_CLINIC_OR_DEPARTMENT_OTHER): Payer: Medicare Other | Attending: Surgery

## 2015-07-23 DIAGNOSIS — I890 Lymphedema, not elsewhere classified: Secondary | ICD-10-CM | POA: Insufficient documentation

## 2015-07-23 DIAGNOSIS — M1990 Unspecified osteoarthritis, unspecified site: Secondary | ICD-10-CM | POA: Insufficient documentation

## 2015-07-23 DIAGNOSIS — Z8673 Personal history of transient ischemic attack (TIA), and cerebral infarction without residual deficits: Secondary | ICD-10-CM | POA: Diagnosis not present

## 2015-07-23 DIAGNOSIS — L97221 Non-pressure chronic ulcer of left calf limited to breakdown of skin: Secondary | ICD-10-CM | POA: Insufficient documentation

## 2015-07-23 DIAGNOSIS — L97211 Non-pressure chronic ulcer of right calf limited to breakdown of skin: Secondary | ICD-10-CM | POA: Diagnosis not present

## 2015-07-23 DIAGNOSIS — L97511 Non-pressure chronic ulcer of other part of right foot limited to breakdown of skin: Secondary | ICD-10-CM | POA: Diagnosis not present

## 2015-07-23 DIAGNOSIS — I10 Essential (primary) hypertension: Secondary | ICD-10-CM | POA: Insufficient documentation

## 2015-07-23 DIAGNOSIS — L97521 Non-pressure chronic ulcer of other part of left foot limited to breakdown of skin: Secondary | ICD-10-CM | POA: Diagnosis not present

## 2015-07-23 DIAGNOSIS — L97811 Non-pressure chronic ulcer of other part of right lower leg limited to breakdown of skin: Secondary | ICD-10-CM | POA: Insufficient documentation

## 2015-07-23 DIAGNOSIS — L97821 Non-pressure chronic ulcer of other part of left lower leg limited to breakdown of skin: Secondary | ICD-10-CM | POA: Diagnosis not present

## 2015-07-23 NOTE — Telephone Encounter (Signed)
Home Health Cert/Plan of Care received (07/12/2015 - 09/09/2015) and placed on MD's desk for signature

## 2015-07-24 ENCOUNTER — Ambulatory Visit (INDEPENDENT_AMBULATORY_CARE_PROVIDER_SITE_OTHER): Payer: Medicare Other | Admitting: Neurology

## 2015-07-24 ENCOUNTER — Encounter: Payer: Self-pay | Admitting: Neurology

## 2015-07-24 VITALS — BP 140/80 | HR 72 | Ht 76.0 in | Wt 217.2 lb

## 2015-07-24 DIAGNOSIS — M48061 Spinal stenosis, lumbar region without neurogenic claudication: Secondary | ICD-10-CM

## 2015-07-24 DIAGNOSIS — R29898 Other symptoms and signs involving the musculoskeletal system: Secondary | ICD-10-CM

## 2015-07-24 DIAGNOSIS — Z4800 Encounter for change or removal of nonsurgical wound dressing: Secondary | ICD-10-CM | POA: Diagnosis not present

## 2015-07-24 DIAGNOSIS — G8310 Monoplegia of lower limb affecting unspecified side: Secondary | ICD-10-CM | POA: Diagnosis not present

## 2015-07-24 DIAGNOSIS — M4806 Spinal stenosis, lumbar region: Secondary | ICD-10-CM

## 2015-07-24 DIAGNOSIS — M6289 Other specified disorders of muscle: Secondary | ICD-10-CM | POA: Diagnosis not present

## 2015-07-24 DIAGNOSIS — Z79891 Long term (current) use of opiate analgesic: Secondary | ICD-10-CM

## 2015-07-24 DIAGNOSIS — I110 Hypertensive heart disease with heart failure: Secondary | ICD-10-CM | POA: Diagnosis not present

## 2015-07-24 DIAGNOSIS — I5030 Unspecified diastolic (congestive) heart failure: Secondary | ICD-10-CM | POA: Diagnosis not present

## 2015-07-24 DIAGNOSIS — M4322 Fusion of spine, cervical region: Secondary | ICD-10-CM

## 2015-07-24 DIAGNOSIS — I872 Venous insufficiency (chronic) (peripheral): Secondary | ICD-10-CM | POA: Diagnosis not present

## 2015-07-24 MED ORDER — TIZANIDINE HCL 2 MG PO TABS: 2.0000 mg | ORAL_TABLET | Freq: Every evening | ORAL | Status: AC | PRN

## 2015-07-24 NOTE — Patient Instructions (Addendum)
Start tizanidine 2mg  at bedtime If not improvement, start gabapentin 300mg  at bedtime Start out-patient PT Call with an update in 6 weeks  Return to clinic 2 months
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
## 2015-07-25 DIAGNOSIS — I110 Hypertensive heart disease with heart failure: Secondary | ICD-10-CM | POA: Diagnosis not present

## 2015-07-25 DIAGNOSIS — Z86718 Personal history of other venous thrombosis and embolism: Secondary | ICD-10-CM | POA: Diagnosis not present

## 2015-07-25 DIAGNOSIS — I5030 Unspecified diastolic (congestive) heart failure: Secondary | ICD-10-CM | POA: Diagnosis not present

## 2015-07-25 DIAGNOSIS — Z8673 Personal history of transient ischemic attack (TIA), and cerebral infarction without residual deficits: Secondary | ICD-10-CM | POA: Diagnosis not present

## 2015-07-25 DIAGNOSIS — Z4800 Encounter for change or removal of nonsurgical wound dressing: Secondary | ICD-10-CM | POA: Diagnosis not present

## 2015-07-25 DIAGNOSIS — Z7982 Long term (current) use of aspirin: Secondary | ICD-10-CM | POA: Diagnosis not present

## 2015-07-25 DIAGNOSIS — I872 Venous insufficiency (chronic) (peripheral): Secondary | ICD-10-CM | POA: Diagnosis not present

## 2015-07-25 DIAGNOSIS — M4322 Fusion of spine, cervical region: Secondary | ICD-10-CM | POA: Diagnosis not present

## 2015-07-25 DIAGNOSIS — Z79891 Long term (current) use of opiate analgesic: Secondary | ICD-10-CM | POA: Diagnosis not present

## 2015-07-25 NOTE — Telephone Encounter (Signed)
Paperwork signed, faxed, copy sent to scan 

## 2015-07-28 ENCOUNTER — Ambulatory Visit: Payer: Medicare Other | Admitting: Neurology

## 2015-07-28 DIAGNOSIS — I872 Venous insufficiency (chronic) (peripheral): Secondary | ICD-10-CM | POA: Diagnosis not present

## 2015-07-28 DIAGNOSIS — Z4800 Encounter for change or removal of nonsurgical wound dressing: Secondary | ICD-10-CM | POA: Diagnosis not present

## 2015-07-28 DIAGNOSIS — I110 Hypertensive heart disease with heart failure: Secondary | ICD-10-CM | POA: Diagnosis not present

## 2015-07-28 DIAGNOSIS — M4322 Fusion of spine, cervical region: Secondary | ICD-10-CM | POA: Diagnosis not present

## 2015-07-28 DIAGNOSIS — Z79891 Long term (current) use of opiate analgesic: Secondary | ICD-10-CM | POA: Diagnosis not present

## 2015-07-28 DIAGNOSIS — I5030 Unspecified diastolic (congestive) heart failure: Secondary | ICD-10-CM | POA: Diagnosis not present

## 2015-07-28 DIAGNOSIS — Z7982 Long term (current) use of aspirin: Secondary | ICD-10-CM | POA: Diagnosis not present

## 2015-07-28 DIAGNOSIS — Z86718 Personal history of other venous thrombosis and embolism: Secondary | ICD-10-CM | POA: Diagnosis not present

## 2015-07-28 DIAGNOSIS — Z8673 Personal history of transient ischemic attack (TIA), and cerebral infarction without residual deficits: Secondary | ICD-10-CM | POA: Diagnosis not present

## 2015-07-30 DIAGNOSIS — L97511 Non-pressure chronic ulcer of other part of right foot limited to breakdown of skin: Secondary | ICD-10-CM | POA: Diagnosis not present

## 2015-07-30 DIAGNOSIS — Z8673 Personal history of transient ischemic attack (TIA), and cerebral infarction without residual deficits: Secondary | ICD-10-CM | POA: Diagnosis not present

## 2015-07-30 DIAGNOSIS — I10 Essential (primary) hypertension: Secondary | ICD-10-CM | POA: Diagnosis not present

## 2015-07-30 DIAGNOSIS — I890 Lymphedema, not elsewhere classified: Secondary | ICD-10-CM | POA: Diagnosis not present

## 2015-07-30 DIAGNOSIS — L97211 Non-pressure chronic ulcer of right calf limited to breakdown of skin: Secondary | ICD-10-CM | POA: Diagnosis not present

## 2015-07-30 DIAGNOSIS — L97821 Non-pressure chronic ulcer of other part of left lower leg limited to breakdown of skin: Secondary | ICD-10-CM | POA: Diagnosis not present

## 2015-07-30 DIAGNOSIS — L97811 Non-pressure chronic ulcer of other part of right lower leg limited to breakdown of skin: Secondary | ICD-10-CM | POA: Diagnosis not present

## 2015-07-30 DIAGNOSIS — L97221 Non-pressure chronic ulcer of left calf limited to breakdown of skin: Secondary | ICD-10-CM | POA: Diagnosis not present

## 2015-07-30 DIAGNOSIS — L97521 Non-pressure chronic ulcer of other part of left foot limited to breakdown of skin: Secondary | ICD-10-CM | POA: Diagnosis not present

## 2015-07-30 DIAGNOSIS — M1990 Unspecified osteoarthritis, unspecified site: Secondary | ICD-10-CM | POA: Diagnosis not present

## 2015-08-01 ENCOUNTER — Encounter: Payer: Self-pay | Admitting: Internal Medicine

## 2015-08-01 DIAGNOSIS — I872 Venous insufficiency (chronic) (peripheral): Secondary | ICD-10-CM | POA: Diagnosis not present

## 2015-08-01 DIAGNOSIS — I110 Hypertensive heart disease with heart failure: Secondary | ICD-10-CM | POA: Diagnosis not present

## 2015-08-01 DIAGNOSIS — Z86718 Personal history of other venous thrombosis and embolism: Secondary | ICD-10-CM | POA: Diagnosis not present

## 2015-08-01 DIAGNOSIS — M4322 Fusion of spine, cervical region: Secondary | ICD-10-CM | POA: Diagnosis not present

## 2015-08-01 DIAGNOSIS — Z8673 Personal history of transient ischemic attack (TIA), and cerebral infarction without residual deficits: Secondary | ICD-10-CM | POA: Diagnosis not present

## 2015-08-01 DIAGNOSIS — Z7982 Long term (current) use of aspirin: Secondary | ICD-10-CM | POA: Diagnosis not present

## 2015-08-01 DIAGNOSIS — Z4800 Encounter for change or removal of nonsurgical wound dressing: Secondary | ICD-10-CM | POA: Diagnosis not present

## 2015-08-01 DIAGNOSIS — I5030 Unspecified diastolic (congestive) heart failure: Secondary | ICD-10-CM | POA: Diagnosis not present

## 2015-08-01 DIAGNOSIS — Z79891 Long term (current) use of opiate analgesic: Secondary | ICD-10-CM | POA: Diagnosis not present

## 2015-08-04 DIAGNOSIS — Z86718 Personal history of other venous thrombosis and embolism: Secondary | ICD-10-CM | POA: Diagnosis not present

## 2015-08-04 DIAGNOSIS — I872 Venous insufficiency (chronic) (peripheral): Secondary | ICD-10-CM | POA: Diagnosis not present

## 2015-08-04 DIAGNOSIS — Z79891 Long term (current) use of opiate analgesic: Secondary | ICD-10-CM | POA: Diagnosis not present

## 2015-08-04 DIAGNOSIS — Z8673 Personal history of transient ischemic attack (TIA), and cerebral infarction without residual deficits: Secondary | ICD-10-CM | POA: Diagnosis not present

## 2015-08-04 DIAGNOSIS — I110 Hypertensive heart disease with heart failure: Secondary | ICD-10-CM | POA: Diagnosis not present

## 2015-08-04 DIAGNOSIS — Z7982 Long term (current) use of aspirin: Secondary | ICD-10-CM | POA: Diagnosis not present

## 2015-08-04 DIAGNOSIS — I5030 Unspecified diastolic (congestive) heart failure: Secondary | ICD-10-CM | POA: Diagnosis not present

## 2015-08-04 DIAGNOSIS — M4322 Fusion of spine, cervical region: Secondary | ICD-10-CM | POA: Diagnosis not present

## 2015-08-04 DIAGNOSIS — Z4800 Encounter for change or removal of nonsurgical wound dressing: Secondary | ICD-10-CM | POA: Diagnosis not present

## 2015-08-05 DIAGNOSIS — R531 Weakness: Secondary | ICD-10-CM | POA: Diagnosis not present

## 2015-08-06 DIAGNOSIS — L97521 Non-pressure chronic ulcer of other part of left foot limited to breakdown of skin: Secondary | ICD-10-CM | POA: Diagnosis not present

## 2015-08-06 DIAGNOSIS — I890 Lymphedema, not elsewhere classified: Secondary | ICD-10-CM | POA: Diagnosis not present

## 2015-08-06 DIAGNOSIS — I10 Essential (primary) hypertension: Secondary | ICD-10-CM | POA: Diagnosis not present

## 2015-08-06 DIAGNOSIS — M1990 Unspecified osteoarthritis, unspecified site: Secondary | ICD-10-CM | POA: Diagnosis not present

## 2015-08-06 DIAGNOSIS — L97821 Non-pressure chronic ulcer of other part of left lower leg limited to breakdown of skin: Secondary | ICD-10-CM | POA: Diagnosis not present

## 2015-08-06 DIAGNOSIS — L97811 Non-pressure chronic ulcer of other part of right lower leg limited to breakdown of skin: Secondary | ICD-10-CM | POA: Diagnosis not present

## 2015-08-06 DIAGNOSIS — L97511 Non-pressure chronic ulcer of other part of right foot limited to breakdown of skin: Secondary | ICD-10-CM | POA: Diagnosis not present

## 2015-08-06 DIAGNOSIS — L97221 Non-pressure chronic ulcer of left calf limited to breakdown of skin: Secondary | ICD-10-CM | POA: Diagnosis not present

## 2015-08-06 DIAGNOSIS — Z8673 Personal history of transient ischemic attack (TIA), and cerebral infarction without residual deficits: Secondary | ICD-10-CM | POA: Diagnosis not present

## 2015-08-06 DIAGNOSIS — L97211 Non-pressure chronic ulcer of right calf limited to breakdown of skin: Secondary | ICD-10-CM | POA: Diagnosis not present

## 2015-08-07 ENCOUNTER — Ambulatory Visit (INDEPENDENT_AMBULATORY_CARE_PROVIDER_SITE_OTHER)
Admission: RE | Admit: 2015-08-07 | Discharge: 2015-08-07 | Disposition: A | Discharge status: Home / Self Care | Payer: Medicare Other | Source: Ambulatory Visit | Attending: Internal Medicine | Admitting: Internal Medicine

## 2015-08-07 ENCOUNTER — Telehealth: Payer: Self-pay

## 2015-08-07 ENCOUNTER — Encounter: Payer: Self-pay | Admitting: Internal Medicine

## 2015-08-07 ENCOUNTER — Ambulatory Visit (INDEPENDENT_AMBULATORY_CARE_PROVIDER_SITE_OTHER): Payer: Medicare Other | Admitting: Internal Medicine

## 2015-08-07 VITALS — BP 168/70 | HR 65 | Wt 223.0 lb

## 2015-08-07 DIAGNOSIS — M545 Low back pain, unspecified: Secondary | ICD-10-CM

## 2015-08-07 DIAGNOSIS — W19XXXA Unspecified fall, initial encounter: Secondary | ICD-10-CM | POA: Diagnosis not present

## 2015-08-07 DIAGNOSIS — M542 Cervicalgia: Secondary | ICD-10-CM

## 2015-08-07 DIAGNOSIS — R0789 Other chest pain: Secondary | ICD-10-CM

## 2015-08-07 DIAGNOSIS — G44309 Post-traumatic headache, unspecified, not intractable: Secondary | ICD-10-CM

## 2015-08-07 DIAGNOSIS — R102 Pelvic and perineal pain: Secondary | ICD-10-CM | POA: Diagnosis not present

## 2015-08-07 DIAGNOSIS — S0990XA Unspecified injury of head, initial encounter: Secondary | ICD-10-CM | POA: Diagnosis not present

## 2015-08-07 DIAGNOSIS — S3993XA Unspecified injury of pelvis, initial encounter: Secondary | ICD-10-CM | POA: Diagnosis not present

## 2015-08-07 DIAGNOSIS — S199XXA Unspecified injury of neck, initial encounter: Secondary | ICD-10-CM | POA: Diagnosis not present

## 2015-08-07 MED ORDER — TRAMADOL-ACETAMINOPHEN 37.5-325 MG PO TABS: 1.0000 | ORAL_TABLET | Freq: Three times a day (TID) | ORAL | Status: DC | PRN

## 2015-08-07 NOTE — Progress Notes (Signed)
Pre visit review using our clinic review tool, if applicable. No additional management support is needed unless otherwise documented below in the visit note. 

## 2015-08-07 NOTE — Progress Notes (Signed)
Subjective:  Patient ID: Kurt Sexton, male    DOB: 1940-08-14  Age: 75 y.o. MRN: [id number]  CC: No chief complaint on file.   HPI Kurt Sexton presents for a fall near [employer] last Sunday when he misjudged his step and fell on the pavement - landed on the R of his tailbone, hit hard - body jerked. C/o R side of the head pain, L chest pain, heart feels funny  Outpatient Prescriptions Prior to Visit  Medication Sig Dispense Refill  . aspirin 81 MG EC tablet Take 81 mg by mouth daily.      . betamethasone dipropionate (DIPROLENE) 0.05 % cream Apply 1 application topically 2 (two) times daily. Reported on 05/27/2015    . Cholecalciferol (VITAMIN D3) 2000 units capsule Take 1 capsule (2,000 Units total) by mouth daily. 100 capsule 3  . ergocalciferol (VITAMIN D2) 50000 UNITS capsule Take 1 capsule (50,000 Units total) by mouth once a week. 6 capsule 0  . ferrous sulfate 325 (65 FE) MG tablet Take 1 tablet (325 mg total) by mouth 2 (two) times daily with a meal. 100 tablet 3  . hydrOXYzine (ATARAX/VISTARIL) 25 MG tablet Take 1 tablet (25 mg total) by mouth every 8 (eight) hours as needed for itching or nausea. 60 tablet 1  . labetalol (NORMODYNE) 200 MG tablet Take 1 tablet (200 mg total) by mouth 3 (three) times daily. 180 tablet 3  . Magnesium Hydroxide (MILK OF MAGNESIA PO) Take by mouth. If no BM in 3 days give 30 cc milk of magnesium p.o. 1 dose in 24 hours as needed    . meclizine (ANTIVERT) 12.5 MG tablet Take 12.5 mg by mouth every 6 (six) hours as needed for dizziness.    Marland Kitchen tiZANidine (ZANAFLEX) 2 MG tablet Take 1 tablet (2 mg total) by mouth at bedtime as needed for muscle spasms. 30 tablet 5  . torsemide (DEMADEX) 20 MG tablet Take 1 tablet (20 mg total) by mouth daily. 30 tablet 2  . triamcinolone ointment (KENALOG) 0.1 % Apply 1 application topically 2 (two) times daily as needed (skin irritation). 80 g 2   No facility-administered medications prior to visit.     ROS Review of Systems  Constitutional: Positive for fatigue. Negative for appetite change and unexpected weight change.  HENT: Negative for congestion, nosebleeds, sneezing, sore throat and trouble swallowing.   Eyes: Negative for itching and visual disturbance.  Respiratory: Positive for shortness of breath. Negative for cough.   Cardiovascular: Positive for leg swelling. Negative for chest pain and palpitations.  Gastrointestinal: Negative for nausea, diarrhea, blood in stool and abdominal distention.  Genitourinary: Negative for frequency and hematuria.  Musculoskeletal: Positive for back pain, arthralgias and gait problem. Negative for joint swelling and neck pain.  Skin: Negative for rash.  Neurological: Positive for weakness, light-headedness and headaches. Negative for dizziness, tremors and speech difficulty.  Psychiatric/Behavioral: Negative for sleep disturbance, dysphoric mood and agitation. The patient is not nervous/anxious.     Objective:  BP 168/70 mmHg  Pulse 65  Wt 223 lb (101.152 kg)  SpO2 97%  BP Readings from Last 3 Encounters:  08/07/15 168/70  07/24/15 140/80  06/23/15 150/72    Wt Readings from Last 3 Encounters:  08/07/15 223 lb (101.152 kg)  07/24/15 217 lb 4 oz (98.544 kg)  06/23/15 222 lb (100.699 kg)    Physical Exam  Constitutional: He is oriented to person, place, and time. He appears well-developed. No distress.  NAD  HENT:  Mouth/Throat: Oropharynx is clear and moist.  Eyes: Conjunctivae are normal. Pupils are equal, round, and reactive to light.  Neck: Normal range of motion. No JVD present. No thyromegaly present.  Cardiovascular: Normal rate, regular rhythm, normal heart sounds and intact distal pulses.  Exam reveals no gallop and no friction rub.   No murmur heard. Pulmonary/Chest: Effort normal and breath sounds normal. No respiratory distress. He has no wheezes. He has no rales. He exhibits no tenderness.  Abdominal: Soft. Bowel  sounds are normal. He exhibits no distension and no mass. There is no tenderness. There is no rebound and no guarding.  Musculoskeletal: Normal range of motion. He exhibits edema and tenderness.  Lymphadenopathy:    He has no cervical adenopathy.  Neurological: He is alert and oriented to person, place, and time. He has normal reflexes. No cranial nerve deficit. He exhibits normal muscle tone. He displays a negative Romberg sign. Coordination abnormal. Gait normal.  Skin: Skin is warm and dry. No rash noted.  Psychiatric: He has a normal mood and affect. His behavior is normal. Judgment and thought content normal.  LEs wrapped Cane LS tender R temple tender R troch major tender   Procedure: EKG Indication: chest pain Impression: NSR. No acute changes.   Lab Results  Component Value Date   WBC 5.8 06/25/2015   HGB 11.1* 06/25/2015   HCT 35.0* 06/25/2015   PLT 287.0 06/25/2015   GLUCOSE 110* 06/25/2015   CHOL 198 06/25/2015   TRIG 121.0 06/25/2015   HDL 39.10 06/25/2015   LDLCALC 135* 06/25/2015   ALT 14 06/25/2015   AST 15 06/25/2015   NA 141 06/25/2015   K 4.5 06/25/2015   CL 104 06/25/2015   CREATININE 1.11 06/25/2015   BUN 17 06/25/2015   CO2 32 06/25/2015   TSH 2.49 06/25/2015   PSA 10.85* 06/25/2015   INR 1.01 10/25/2010   HGBA1C 5.2 06/25/2015    Mr Lumbar Spine Wo Contrast  04/18/2015  CLINICAL DATA:  Bilateral leg weakness with hyperreflexia. Gait instability. EXAM: MRI LUMBAR SPINE WITHOUT CONTRAST TECHNIQUE: Multiplanar, multisequence MR imaging of the lumbar spine was performed. No intravenous contrast was administered. COMPARISON:  CT scan of the abdomen and pelvis dated 10/24/2014 and lumbar MRI dated 10/18/2008 FINDINGS: Normal conus tip at L1.  Normal paraspinal soft tissues. T12-L1:  Normal. L1-2: Increased broad-based soft disc bulge with a new focal soft disc extrusion into the left lateral recess and proximal left neural foramen. New hypertrophy of the  left ligamentum flavum. This should affect the left L1 and L2 nerves. The thecal sac is slightly compressed. L2-3: Small broad-based soft disc protrusion extending foraminal and extra foraminal, unchanged. Hypertrophy of the facet joints and ligamentum flavum create moderately severe spinal stenosis and bilateral lateral recess compression, right more than left. L3-4: Broad-based soft disc protrusion extending foraminal and extra foraminal on the right with a slight mass effect upon the right L3 nerve lateral to the neural foramen. The protrusion is slightly more prominent than on the prior study. Increased bilateral facet arthritis and hypertrophy narrowing both lateral recesses, right more than left. L4-5: 3 mm spondylolisthesis due to severe bilateral facet arthritis. This has slightly increased since the prior study. Severe spinal stenosis due to hypertrophy of the ligamentum flavum and facet joints with marked compression of the lateral recesses which should affect both L5 nerves. Moderately severe right foraminal stenosis which might affect the right L4 nerve. L5-S1: Normal disc. Minimal degenerative changes of the  right facet joint. IMPRESSION: 1. Severe spinal stenosis at L4-5, slightly increased since the prior study with slightly increased grade 1 spondylolisthesis. 2. Increased disc protrusion at L3-4 far lateral on the right with a slight mass effect upon the right L3 nerve lateral to the neural foramen. 3. New soft disc extrusion into the left lateral recess and left neural foramen at L1-2 which could affect the left L1 and L2 nerves. 4. Chronic moderately severe spinal stenosis at L2-3. Electronically Signed   By: Lorriane Shire M.D.   On: 04/18/2015 13:50    Assessment & Plan:   There are no diagnoses linked to this encounter. I am having Kurt Sexton maintain his aspirin, hydrOXYzine, betamethasone dipropionate, labetalol, meclizine, Magnesium Hydroxide (MILK OF MAGNESIA PO), ergocalciferol,  ferrous sulfate, Vitamin D3, torsemide, triamcinolone ointment, and tiZANidine.  No orders of the defined types were placed in this encounter.     Follow-up: No Follow-up on file.  [name], MD

## 2015-08-07 NOTE — Assessment & Plan Note (Signed)
  5/17 R HA due to a fall CT head

## 2015-08-07 NOTE — Assessment & Plan Note (Signed)
5/17 repeat strain due to a fall CT neck

## 2015-08-07 NOTE — Telephone Encounter (Signed)
Lisa/imaging called to ask if dr plotnikovs order for this patient could be changed so that test would be looking for brain hemorrhage and neck fx ----per dr plotniko ok for order to be changed to look for these on tests ordered----lisa advised

## 2015-08-07 NOTE — Assessment & Plan Note (Signed)
5/17 a fall near [employer] last Sunday when he misjudged his step and fell on the pavement - landed on the R of his tailbone, hit hard - body jerked. C/o R side of the head pain, L chest pain, heart feels funny

## 2015-08-08 DIAGNOSIS — I110 Hypertensive heart disease with heart failure: Secondary | ICD-10-CM | POA: Diagnosis not present

## 2015-08-08 DIAGNOSIS — I872 Venous insufficiency (chronic) (peripheral): Secondary | ICD-10-CM | POA: Diagnosis not present

## 2015-08-08 DIAGNOSIS — Z8673 Personal history of transient ischemic attack (TIA), and cerebral infarction without residual deficits: Secondary | ICD-10-CM | POA: Diagnosis not present

## 2015-08-08 DIAGNOSIS — G44309 Post-traumatic headache, unspecified, not intractable: Secondary | ICD-10-CM | POA: Diagnosis not present

## 2015-08-08 DIAGNOSIS — M4322 Fusion of spine, cervical region: Secondary | ICD-10-CM | POA: Diagnosis not present

## 2015-08-08 DIAGNOSIS — M542 Cervicalgia: Secondary | ICD-10-CM | POA: Diagnosis not present

## 2015-08-08 DIAGNOSIS — M545 Low back pain: Secondary | ICD-10-CM | POA: Diagnosis not present

## 2015-08-08 DIAGNOSIS — Z86718 Personal history of other venous thrombosis and embolism: Secondary | ICD-10-CM | POA: Diagnosis not present

## 2015-08-08 DIAGNOSIS — R0789 Other chest pain: Secondary | ICD-10-CM | POA: Diagnosis not present

## 2015-08-08 DIAGNOSIS — Z7982 Long term (current) use of aspirin: Secondary | ICD-10-CM | POA: Diagnosis not present

## 2015-08-08 DIAGNOSIS — Z4800 Encounter for change or removal of nonsurgical wound dressing: Secondary | ICD-10-CM | POA: Diagnosis not present

## 2015-08-08 DIAGNOSIS — I5030 Unspecified diastolic (congestive) heart failure: Secondary | ICD-10-CM | POA: Diagnosis not present

## 2015-08-08 DIAGNOSIS — Z79891 Long term (current) use of opiate analgesic: Secondary | ICD-10-CM | POA: Diagnosis not present

## 2015-08-08 MED ORDER — KETOROLAC TROMETHAMINE 30 MG/ML IJ SOLN
30.0000 mg | Freq: Once | INTRAMUSCULAR | Status: AC
  Administered 2015-08-08: 30 mg via INTRAMUSCULAR

## 2015-08-08 NOTE — Addendum Note (Signed)
Addended by: Cresenciano Lick on: 08/08/2015 04:36 PM   Modules accepted: Orders

## 2015-08-11 DIAGNOSIS — I5030 Unspecified diastolic (congestive) heart failure: Secondary | ICD-10-CM | POA: Diagnosis not present

## 2015-08-11 DIAGNOSIS — Z4800 Encounter for change or removal of nonsurgical wound dressing: Secondary | ICD-10-CM | POA: Diagnosis not present

## 2015-08-11 DIAGNOSIS — M4322 Fusion of spine, cervical region: Secondary | ICD-10-CM | POA: Diagnosis not present

## 2015-08-11 DIAGNOSIS — I872 Venous insufficiency (chronic) (peripheral): Secondary | ICD-10-CM | POA: Diagnosis not present

## 2015-08-11 DIAGNOSIS — I110 Hypertensive heart disease with heart failure: Secondary | ICD-10-CM | POA: Diagnosis not present

## 2015-08-11 DIAGNOSIS — Z8673 Personal history of transient ischemic attack (TIA), and cerebral infarction without residual deficits: Secondary | ICD-10-CM | POA: Diagnosis not present

## 2015-08-11 DIAGNOSIS — Z7982 Long term (current) use of aspirin: Secondary | ICD-10-CM | POA: Diagnosis not present

## 2015-08-11 DIAGNOSIS — Z86718 Personal history of other venous thrombosis and embolism: Secondary | ICD-10-CM | POA: Diagnosis not present

## 2015-08-11 DIAGNOSIS — Z79891 Long term (current) use of opiate analgesic: Secondary | ICD-10-CM | POA: Diagnosis not present

## 2015-08-11 NOTE — Progress Notes (Signed)
HISTORY AND PHYSICAL   DATE: 02/24/15  Location:  [facility]    Place of Service: SNF (31)   Extended Emergency Contact Information Primary Emergency Contact: Deangelo,Patricia Address: [address]          [place], [place] [address] Johnnette Litter of [place] Phone: [phone] Mobile Phone: [phone] Relation: Spouse Secondary Emergency Contact: Keng,Tony  [place] Relation: Son  Advanced Directive information Does patient have an advance directive?: Yes, Type of Advance Directive: Out of facility DNR (pink MOST or yellow form)  Chief Complaint  Patient presents with  . New Admit To SNF    HPI:  75 yo male seen today as a new admission into [facility] following hospital stay for LE edema, leg pain, venous wounds, chronic RLE DVT, OA/gout, hyperlipidemia, HTN, anemia.  LE doppler revealed no acute DVT or Baker's cyst. 2D echo showed nml EF with Grade 1 DD. demadex continued. Wound care followed for leg wounds. Hgb 9.7; iron 16; ferritin 74. He given a dose of feraheme  He c/o constipation but had a BM last night. He is c/a when he will start PT. No other concerns. No nursing issues. No falls  HTN/LE edema - stable on labetalol. He is not on diuretic at this time  Chronic venous insufficiency - followed by wound care. Topical steroid agents used  RLE DVT chronic - takes ASA daily  OA/gout - pain stable on percocet and obaxin  Hyperlipidemia - stable on lipitor  Anemia - Hgb stable   Past Medical History  Diagnosis Date  . HTN (hypertension)   . BPH (benign prostatic hyperplasia)     Dr [name]  . Vitamin D deficiency   . LBP (low back pain) 2010  . History of gout   . High cholesterol   . Prostate cancer ([place]) 01/2008    gleason 3+4=7, PSA 4.93, vol 34.18 cc  . Venous insufficiency   . CHF (congestive heart failure) ([place])   . CVA (cerebral vascular accident) ([place]) 1998    denies residual on 04/17/2015  . Osteoarthritis     "right knee; right hand" (04/17/2015)    Past Surgical History  Procedure Laterality Date  . Knee arthroscopy w/ pcl and lcl  repair & tendon graft Left   . Correction hammer toe Left   . Prostate biopsy  01/2008    gleason 7  . Prostate biopsy  03/27/13    gleason 3+4=7, volume 34.18 cc  . Tonsillectomy    . Anterior cervical decomp/discectomy fusion N/A 11/22/2014    Procedure: Cervical Six Corpectomy with graft and plating Cervical Five-Seven;  Surgeon: Erline Levine, MD;  Location: [facility];  Service: Neurosurgery;  Laterality: N/A;  . Inguinal hernia repair Bilateral   . Back surgery      Patient Care Team: [name], MD as PCP - General Milus Banister, MD (Gastroenterology) Justice Britain, MD (Orthopedic Surgery) Lowella Bandy, MD as Consulting Physician (Urology) Rolm Bookbinder, MD as Consulting Physician (Dermatology)  Social History   Social History  . Marital Status: Married    Spouse Name: N/A  . Number of Children: 2  . Years of Education: N/A   Occupational History  . Retired Astronomer [employer]   Social History Main Topics  . Smoking status: Never Smoker   . Smokeless tobacco: Never Used  . Alcohol Use: No  . Drug Use: No  . Sexual Activity: No   Other Topics Concern  .  Not on file   Social History Narrative   Regular Exercise -  YES           reports that he has never smoked. He has never used smokeless tobacco. He reports that he does not drink alcohol or use illicit drugs.  Family History  Problem Relation Age of Onset  . Diabetes Other     1st degree relative  . Hypertension Other   . Cancer Brother     prostate, tx w/xrt   Family Status  Relation Status Death Age  . Mother Deceased   . Father Deceased   . Sister Alive   . Brother Marketing executive History  Administered Date(s) Administered  . Influenza Split 12/07/2010, 12/07/2011  . Influenza Whole 12/25/2008, 12/18/2009  . Influenza,inj,Quad PF,36+ Mos 12/04/2012,  12/05/2013, 01/02/2015  . Pneumococcal Conjugate-13 06/23/2015  . Pneumococcal Polysaccharide-23 05/02/2008  . Td 03/20/2009  . Zoster 03/27/2014    Allergies  Allergen Reactions  . Diazepam Other (See Comments)    lethargic  . Amlodipine Besylate Other (See Comments)    dizzy  . Cozaar     Severe headache  . Doxazosin Mesylate Other (See Comments)    dizzy  . Lisinopril Other (See Comments)    headache  . Ramipril Other (See Comments)    headache  . Spironolactone Other (See Comments)    High potassium  . Bactrim [Sulfamethoxazole-Trimethoprim] Rash    Medications: Patient's Medications  New Prescriptions   CHOLECALCIFEROL (VITAMIN D3) 2000 UNITS CAPSULE    Take 1 capsule (2,000 Units total) by mouth daily.   ERGOCALCIFEROL (VITAMIN D2) 50000 UNITS CAPSULE    Take 1 capsule (50,000 Units total) by mouth once a week.   TIZANIDINE (ZANAFLEX) 2 MG TABLET    Take 1 tablet (2 mg total) by mouth at bedtime as needed for muscle spasms.   TRAMADOL-ACETAMINOPHEN (ULTRACET) 37.5-325 MG TABLET    Take 1 tablet by mouth every 8 (eight) hours as needed for severe pain.  Previous Medications   ASPIRIN 81 MG EC TABLET    Take 81 mg by mouth daily.     BETAMETHASONE DIPROPIONATE (DIPROLENE) 0.05 % CREAM    Apply 1 application topically 2 (two) times daily. Reported on 05/27/2015   HYDROXYZINE (ATARAX/VISTARIL) 25 MG TABLET    Take 1 tablet (25 mg total) by mouth every 8 (eight) hours as needed for itching or nausea.   LABETALOL (NORMODYNE) 200 MG TABLET    Take 1 tablet (200 mg total) by mouth 3 (three) times daily.   MAGNESIUM HYDROXIDE (MILK OF MAGNESIA PO)    Take by mouth. If no BM in 3 days give 30 cc milk of magnesium p.o. 1 dose in 24 hours as needed   MECLIZINE (ANTIVERT) 12.5 MG TABLET    Take 12.5 mg by mouth every 6 (six) hours as needed for dizziness.  Modified Medications   Modified Medication Previous Medication   FERROUS SULFATE 325 (65 FE) MG TABLET ferrous sulfate 325 (65  FE) MG tablet      Take 1 tablet (325 mg total) by mouth 2 (two) times daily with a meal.    Take 1 tablet (325 mg total) by mouth daily.   TORSEMIDE (DEMADEX) 20 MG TABLET torsemide (DEMADEX) 20 MG tablet      Take 1 tablet (20 mg total) by mouth daily.    Take 1 tablet (20 mg total) by mouth daily.   TRIAMCINOLONE OINTMENT (KENALOG) 0.1 % triamcinolone  ointment (KENALOG) 0.1 %      Apply 1 application topically 2 (two) times daily as needed (skin irritation).    Apply 1 application topically 2 (two) times daily as needed (skin irritation).  Discontinued Medications   ATORVASTATIN (LIPITOR) 10 MG TABLET    Take 1 tablet (10 mg total) by mouth daily.   BISACODYL (DULCOLAX) 10 MG SUPPOSITORY    Reported on 03/19/2015   METHOCARBAMOL (ROBAXIN) 500 MG TABLET    Take 1.5 tablets (750 mg total) by mouth every 6 (six) hours as needed for muscle spasms.   METHOCARBAMOL (ROBAXIN) 750 MG TABLET    Take 750 mg by mouth every 6 (six) hours as needed for muscle spasms.   OXYCODONE-ACETAMINOPHEN (PERCOCET/ROXICET) 5-325 MG TABLET    Take 1-2 tablets by mouth every 4 (four) hours as needed for moderate pain.   SENNA PO    Take by mouth.   TRIAMCINOLONE OINTMENT (KENALOG) 0.1 %    Apply 1 application topically 2 (two) times daily as needed (skin irritation).     Review of Systems  Cardiovascular: Positive for leg swelling.  Gastrointestinal: Positive for constipation.  Neurological: Positive for dizziness (on prn meclizine).  All other systems reviewed and are negative.   Filed Vitals:   02/24/15 1613  BP: 133/63  Pulse: 69  Temp: 98.6 F (37 C)  TempSrc: Oral  Resp: 20   There is no weight on file to calculate BMI.  Physical Exam  Constitutional: He is oriented to person, place, and time. He appears well-developed and well-nourished.  Sitting on bed in NAD  HENT:  Mouth/Throat: Oropharynx is clear and moist.  Eyes: Pupils are equal, round, and reactive to light. No scleral icterus.  Neck:  Neck supple. Carotid bruit is not present. No thyromegaly present.  Cardiovascular: Normal rate, regular rhythm, normal heart sounds and intact distal pulses.  Exam reveals no gallop and no friction rub.   No murmur heard. R>LLE edema. Min right calf TTP but no left TTP.   Pulmonary/Chest: Effort normal and breath sounds normal. He has no wheezes. He has no rales. He exhibits no tenderness.  Abdominal: Soft. Bowel sounds are normal. He exhibits distension. He exhibits no abdominal bruit, no pulsatile midline mass and no mass. There is no tenderness. There is no rebound and no guarding.  Musculoskeletal: He exhibits edema and tenderness.  R>L knee swelling with increased warmth to touch and reduced ROM; FROM toes b/l  Lymphadenopathy:    He has no cervical adenopathy.  Neurological: He is alert and oriented to person, place, and time.  Skin: Skin is warm and dry. Rash noted.  B/l prevalon boots intact; b/l LE dsg intact, clean and dry; chronic venous stasis changes b/l  Psychiatric: He has a normal mood and affect. His behavior is normal. Judgment and thought content normal.     Labs reviewed: Admission on 02/18/2015, Discharged on 02/20/2015  Component Date Value Ref Range Status  . Glucose-Capillary 02/18/2015 118* 65 - 99 mg/dL Final  . Comment 1 63/60/9734 Notify RN   Final  . Sodium 02/18/2015 137  135 - 145 mmol/L Final  . Potassium 02/18/2015 3.8  3.5 - 5.1 mmol/L Final  . Chloride 02/18/2015 104  101 - 111 mmol/L Final  . CO2 02/18/2015 27  22 - 32 mmol/L Final  . Glucose, Bld 02/18/2015 114* 65 - 99 mg/dL Final  . BUN 77/04/3408 16  6 - 20 mg/dL Final  . Creatinine, Ser 02/18/2015 1.29* 0.61 -  1.24 mg/dL Final  . Calcium 02/18/2015 8.7* 8.9 - 10.3 mg/dL Final  . GFR calc non Af Amer 02/18/2015 53* >60 mL/min Final  . GFR calc Af Amer 02/18/2015 >60  >60 mL/min Final   Comment: (NOTE) The eGFR has been calculated using the CKD EPI equation. This calculation has not been  validated in all clinical situations. eGFR's persistently <60 mL/min signify possible Chronic Kidney Disease.   . Anion gap 02/18/2015 6  5 - 15 Final  . WBC 02/18/2015 8.9  4.0 - 10.5 K/uL Final  . RBC 02/18/2015 4.23  4.22 - 5.81 MIL/uL Final  . Hemoglobin 02/18/2015 10.9* 13.0 - 17.0 g/dL Final  . HCT 02/18/2015 34.5* 39.0 - 52.0 % Final  . MCV 02/18/2015 81.6  78.0 - 100.0 fL Final  . MCH 02/18/2015 25.8* 26.0 - 34.0 pg Final  . MCHC 02/18/2015 31.6  30.0 - 36.0 g/dL Final  . RDW 02/18/2015 13.9  11.5 - 15.5 % Final  . Platelets 02/18/2015 243  150 - 400 K/uL Final  . Color, Urine 02/18/2015 YELLOW  YELLOW Final  . APPearance 02/18/2015 CLEAR  CLEAR Final  . Specific Gravity, Urine 02/18/2015 1.023  1.005 - 1.030 Final  . pH 02/18/2015 6.0  5.0 - 8.0 Final  . Glucose, UA 02/18/2015 NEGATIVE  NEGATIVE mg/dL Final  . Hgb urine dipstick 02/18/2015 NEGATIVE  NEGATIVE Final  . Bilirubin Urine 02/18/2015 NEGATIVE  NEGATIVE Final  . Ketones, ur 02/18/2015 NEGATIVE  NEGATIVE mg/dL Final  . Protein, ur 02/18/2015 NEGATIVE  NEGATIVE mg/dL Final  . Nitrite 02/18/2015 NEGATIVE  NEGATIVE Final  . Leukocytes, UA 02/18/2015 NEGATIVE  NEGATIVE Final   MICROSCOPIC NOT DONE ON URINES WITH NEGATIVE PROTEIN, BLOOD, LEUKOCYTES, NITRITE, OR GLUCOSE <1000 mg/dL.  . Troponin i, poc 02/18/2015 0.02  0.00 - 0.08 ng/mL Final  . Comment 3 02/18/2015          Final   Comment: Due to the release kinetics of cTnI, a negative result within the first hours of the onset of symptoms does not rule out myocardial infarction with certainty. If myocardial infarction is still suspected, repeat the test at appropriate intervals.   . B Natriuretic Peptide 02/18/2015 159.2* 0.0 - 100.0 pg/mL Final  . WBC 02/19/2015 6.9  4.0 - 10.5 K/uL Final  . RBC 02/19/2015 4.22  4.22 - 5.81 MIL/uL Final  . Hemoglobin 02/19/2015 10.9* 13.0 - 17.0 g/dL Final  . HCT 02/19/2015 34.4* 39.0 - 52.0 % Final  . MCV 02/19/2015 81.5  78.0  - 100.0 fL Final  . MCH 02/19/2015 25.8* 26.0 - 34.0 pg Final  . MCHC 02/19/2015 31.7  30.0 - 36.0 g/dL Final  . RDW 02/19/2015 13.8  11.5 - 15.5 % Final  . Platelets 02/19/2015 227  150 - 400 K/uL Final  . Sodium 02/19/2015 138  135 - 145 mmol/L Final  . Potassium 02/19/2015 3.6  3.5 - 5.1 mmol/L Final  . Chloride 02/19/2015 103  101 - 111 mmol/L Final  . CO2 02/19/2015 28  22 - 32 mmol/L Final  . Glucose, Bld 02/19/2015 113* 65 - 99 mg/dL Final  . BUN 02/19/2015 16  6 - 20 mg/dL Final  . Creatinine, Ser 02/19/2015 1.17  0.61 - 1.24 mg/dL Final  . Calcium 02/19/2015 8.7* 8.9 - 10.3 mg/dL Final  . Total Protein 02/19/2015 7.0  6.5 - 8.1 g/dL Final  . Albumin 02/19/2015 3.7  3.5 - 5.0 g/dL Final  . AST 02/19/2015 26  15 -  41 U/L Final  . ALT 02/19/2015 24  17 - 63 U/L Final  . Alkaline Phosphatase 02/19/2015 104  38 - 126 U/L Final  . Total Bilirubin 02/19/2015 1.1  0.3 - 1.2 mg/dL Final  . GFR calc non Af Amer 02/19/2015 59* >60 mL/min Final  . GFR calc Af Amer 02/19/2015 >60  >60 mL/min Final   Comment: (NOTE) The eGFR has been calculated using the CKD EPI equation. This calculation has not been validated in all clinical situations. eGFR's persistently <60 mL/min signify possible Chronic Kidney Disease.   . Anion gap 02/19/2015 7  5 - 15 Final  . Iron 02/19/2015 16* 45 - 182 ug/dL Final  . TIBC 02/19/2015 322  250 - 450 ug/dL Final  . Saturation Ratios 02/19/2015 5* 17.9 - 39.5 % Final  . UIBC 02/19/2015 306   Final   Performed at [facility]  . Ferritin 02/19/2015 74  24 - 336 ng/mL Final   Performed at [place] 02/19/2015 1.3  0.4 - 3.1 % Final  . RBC. 02/19/2015 4.27  4.22 - 5.81 MIL/uL Final  . Retic Count, Manual 02/19/2015 55.5  19.0 - 186.0 K/uL Final  . Vitamin B-12 02/19/2015 462  180 - 914 pg/mL Final   Comment: (NOTE) This assay is not validated for testing neonatal or myeloproliferative syndrome specimens for Vitamin B12  levels. Performed at [facility]   . Folate 02/19/2015 13.2  >5.9 ng/mL Final   Performed at [facility]  . Magnesium 02/19/2015 2.1  1.7 - 2.4 mg/dL Final  . Recommendations-F5LEID: 02/19/2015 Comment   Final   Comment: (NOTE) Result:  Negative (no mutation found) Factor V Leiden is a specific mutation ([id number]) in the factor V gene that is associated with an increased risk of venous thrombosis. Factor V Leiden is more resistant to inactivation by activated protein C.  As a result, factor V persists in the circulation leading to a mild hyper- coagulable state.  The Leiden mutation accounts for 90% - 95% of APC resistance.  Factor V Leiden has been reported in patients with deep vein thrombosis, pulmonary embolus, central retinal vein occlusion, cerebral sinus thrombosis and hepatic vein thrombosis. Other risk factors to be considered in the workup for venous thrombosis include the [id number] mutation in the factor II (prothrombin) gene, protein S and C deficiency, and antithrombin deficiencies. Anticardiolipin antibody and lupus anticoagulant analysis may be appropriate for certain patients, as well as homocysteine levels. Contact your local [facility] for information on how to order additi                          onal testing if desired.   . Comment 02/19/2015 Comment   Corrected   Comment: (NOTE) **Genetic counselors are available for health care providers to**  discuss results at [phone]). Methodology: DNA analysis of the Factor V gene was performed by allele-specific PCR. The diagnostic sensitivity and specificity is >99% for both. Molecular-based testing is highly accurate, but as in any laboratory test, diagnostic errors may occur. All test results must be combined with clinical information for the most accurate interpretation. References: Voelkerding K (1996).  Clin Lab Med [phone]. Allison Quarry, PhD, [facility] Ruben Reason, PhD,  [facility] Jens Som, PhD, [facility] Annetta Maw, M.S., PhD, [facility] Alfredo Bach, PhD, [facility] Norva Riffle, PhD, [facility] Earlean Polka PhD, [facility] Performed At: [facility]  [address] [place], [place] [id number] Nechama Guard MD NO:[id number]   . Protein C Activity 02/19/2015 111  73 - 180 % Final   Comment: (NOTE) Performed At: [facility] [place], [place] [id number] Lindon Romp MD [id number]   . Protein S Activity 02/19/2015 88  63 - 140 % Final   Comment: (NOTE) Protein S activity may be falsely increased (masking an abnormal, low result) in patients receiving direct Xa inhibitor (e.g., rivaroxaban, apixaban, edoxaban) or a direct thrombin inhibitor (e.g., dabigatran) anticoagulant treatment due to assay interference by these drugs. Performed At: [facility] [place], [place] [id number] Lindon Romp MD GY:[id number]   . Protein S Ag, Total 02/19/2015 111  60 - 150 % Final   Comment: (NOTE) Performed At: [facility] [place], [place] [id number] Lindon Romp MD [id number]   . Protein C, Total 02/19/2015 72  60 - 150 % Final   Comment: (NOTE) Performed At: [facility] [place], [place] [id number] Lindon Romp MD [id number]   . WBC 02/20/2015 7.8  4.0 - 10.5 K/uL Final  . RBC 02/20/2015 3.66* 4.22 - 5.81 MIL/uL Final  . Hemoglobin 02/20/2015 9.7* 13.0 - 17.0 g/dL Final  . HCT 02/20/2015 30.2* 39.0 - 52.0 % Final  . MCV 02/20/2015 82.5  78.0 - 100.0 fL Final  . MCH 02/20/2015 26.5  26.0 - 34.0 pg Final  . MCHC 02/20/2015 32.1  30.0 - 36.0 g/dL Final  . RDW 02/20/2015 13.9  11.5 - 15.5 % Final  . Platelets 02/20/2015 213  150 - 400 K/uL Final  . Sodium 02/20/2015 141  135 - 145 mmol/L Final  . Potassium 02/20/2015 4.4  3.5 - 5.1 mmol/L Final   Comment: DELTA CHECK NOTED REPEATED TO VERIFY NO VISIBLE HEMOLYSIS   . Chloride 02/20/2015  106  101 - 111 mmol/L Final  . CO2 02/20/2015 28  22 - 32 mmol/L Final  . Glucose, Bld 02/20/2015 119* 65 - 99 mg/dL Final  . BUN 02/20/2015 17  6 - 20 mg/dL Final  . Creatinine, Ser 02/20/2015 1.19  0.61 - 1.24 mg/dL Final  . Calcium 02/20/2015 8.4* 8.9 - 10.3 mg/dL Final  . GFR calc non Af Amer 02/20/2015 58* >60 mL/min Final  . GFR calc Af Amer 02/20/2015 >60  >60 mL/min Final   Comment: (NOTE) The eGFR has been calculated using the CKD EPI equation. This calculation has not been validated in all clinical situations. eGFR's persistently <60 mL/min signify possible Chronic Kidney Disease.   . Anion gap 02/20/2015 7  5 - 15 Final    Dg Pelvis 1-2 Views  08/07/2015  CLINICAL DATA:  Fall 5 days ago.  Pelvis pain. EXAM: PELVIS - 1-2 VIEW COMPARISON:  None. FINDINGS: Both hips appear located. There is no evidence for acute fracture or subluxation. Mild bilateral hip osteoarthritis noted. Degenerative disc disease noted within the lumbar spine. IMPRESSION: 1. No acute findings identified. 2. Degenerative change as above. Electronically Signed   By: Kerby Moors M.D.   On: 08/07/2015 15:34   Ct Head Wo Contrast  08/07/2015  CLINICAL DATA:  Status post fall 5 days ago. Persistent right neck and right hand pain. Initial encounter. EXAM: CT HEAD WITHOUT CONTRAST CT CERVICAL SPINE WITHOUT CONTRAST TECHNIQUE: Multidetector CT imaging of the head and cervical spine was performed following the standard protocol without intravenous contrast. Multiplanar CT image reconstructions of the cervical spine were also generated. COMPARISON:  Head and cervical spine CT scans 10/24/2014. FINDINGS: CT HEAD FINDINGS There is some cortical atrophy and chronic microvascular ischemic change as seen on the prior exam. No evidence of acute intracranial abnormality including hemorrhage, infarct, mass lesion, mass effect, midline shift or abnormal extra-axial fluid collection is identified. The calvarium is intact. Imaged  paranasal sinuses and mastoid air cells are clear. CT CERVICAL SPINE FINDINGS Since the prior CT scan, the patient has undergone C6 corpectomy and C5-7 anterior fusion. No fracture or malalignment is identified. Lung apices are clear. IMPRESSION: No acute abnormality head or cervical spine. Atrophy and chronic microvascular ischemic change. Status post C6-5 7 anterior fusion in C6 corpectomy. Electronically Signed   By: Inge Rise M.D.   On: 08/07/2015 14:39   Ct Cervical Spine Wo Contrast  08/07/2015  CLINICAL DATA:  Status post fall 5 days ago. Persistent right neck and right hand pain. Initial encounter. EXAM: CT HEAD WITHOUT CONTRAST CT CERVICAL SPINE WITHOUT CONTRAST TECHNIQUE: Multidetector CT imaging of the head and cervical spine was performed following the standard protocol without intravenous contrast. Multiplanar CT image reconstructions of the cervical spine were also generated. COMPARISON:  Head and cervical spine CT scans 10/24/2014. FINDINGS: CT HEAD FINDINGS There is some cortical atrophy and chronic microvascular ischemic change as seen on the prior exam. No evidence of acute intracranial abnormality including hemorrhage, infarct, mass lesion, mass effect, midline shift or abnormal extra-axial fluid collection is identified. The calvarium is intact. Imaged paranasal sinuses and mastoid air cells are clear. CT CERVICAL SPINE FINDINGS Since the prior CT scan, the patient has undergone C6 corpectomy and C5-7 anterior fusion. No fracture or malalignment is identified. Lung apices are clear. IMPRESSION: No acute abnormality head or cervical spine. Atrophy and chronic microvascular ischemic change. Status post C6-5 7 anterior fusion in C6 corpectomy. Electronically Signed   By: Inge Rise M.D.   On: 08/07/2015 14:39     Assessment/Plan   ICD-9-CM ICD-10-CM   1. Constipation, unspecified constipation type 564.00 K59.00   2. Bilateral leg edema 782.3 R60.0   3. Venous stasis ulcers of  both lower extremities 459.81 I87.2    707.10    4. Chronic deep vein thrombosis (DVT) of femoral vein of right lower extremity (HCC) 453.51 I82.511   5. Essential hypertension 401.9 I10   6. Anemia, unspecified anemia type 285.9 D64.9   7. Pain in joint, multiple sites 719.49 M25.50     Start colace '100mg'$  po daily  Cont other meds as ordered  Wound care as indicated  PT/OT/St as ordered  GOAL: short term rehab and d/c home when medically appropriate. Communicated with pt and nursing.  Will follow  Alessia Gonsalez S. Perlie Gold  [facility] [address] [place], [place] [address] [phone] Cell (Monday-Friday 8 AM - 5 PM) [phone] After 5 PM and follow prompts

## 2015-08-12 DIAGNOSIS — R531 Weakness: Secondary | ICD-10-CM | POA: Diagnosis not present

## 2015-08-13 DIAGNOSIS — S91302A Unspecified open wound, left foot, initial encounter: Secondary | ICD-10-CM | POA: Diagnosis not present

## 2015-08-13 DIAGNOSIS — L97821 Non-pressure chronic ulcer of other part of left lower leg limited to breakdown of skin: Secondary | ICD-10-CM | POA: Diagnosis not present

## 2015-08-13 DIAGNOSIS — L97811 Non-pressure chronic ulcer of other part of right lower leg limited to breakdown of skin: Secondary | ICD-10-CM | POA: Diagnosis not present

## 2015-08-13 DIAGNOSIS — L97221 Non-pressure chronic ulcer of left calf limited to breakdown of skin: Secondary | ICD-10-CM | POA: Diagnosis not present

## 2015-08-13 DIAGNOSIS — S81801A Unspecified open wound, right lower leg, initial encounter: Secondary | ICD-10-CM | POA: Diagnosis not present

## 2015-08-13 DIAGNOSIS — S91301A Unspecified open wound, right foot, initial encounter: Secondary | ICD-10-CM | POA: Diagnosis not present

## 2015-08-13 DIAGNOSIS — M1990 Unspecified osteoarthritis, unspecified site: Secondary | ICD-10-CM | POA: Diagnosis not present

## 2015-08-13 DIAGNOSIS — L97521 Non-pressure chronic ulcer of other part of left foot limited to breakdown of skin: Secondary | ICD-10-CM | POA: Diagnosis not present

## 2015-08-13 DIAGNOSIS — I10 Essential (primary) hypertension: Secondary | ICD-10-CM | POA: Diagnosis not present

## 2015-08-13 DIAGNOSIS — S81802A Unspecified open wound, left lower leg, initial encounter: Secondary | ICD-10-CM | POA: Diagnosis not present

## 2015-08-13 DIAGNOSIS — L97511 Non-pressure chronic ulcer of other part of right foot limited to breakdown of skin: Secondary | ICD-10-CM | POA: Diagnosis not present

## 2015-08-13 DIAGNOSIS — Z8673 Personal history of transient ischemic attack (TIA), and cerebral infarction without residual deficits: Secondary | ICD-10-CM | POA: Diagnosis not present

## 2015-08-13 DIAGNOSIS — L97211 Non-pressure chronic ulcer of right calf limited to breakdown of skin: Secondary | ICD-10-CM | POA: Diagnosis not present

## 2015-08-13 DIAGNOSIS — I890 Lymphedema, not elsewhere classified: Secondary | ICD-10-CM | POA: Diagnosis not present

## 2015-08-14 DIAGNOSIS — R531 Weakness: Secondary | ICD-10-CM | POA: Diagnosis not present

## 2015-08-15 DIAGNOSIS — Z86718 Personal history of other venous thrombosis and embolism: Secondary | ICD-10-CM | POA: Diagnosis not present

## 2015-08-15 DIAGNOSIS — Z4800 Encounter for change or removal of nonsurgical wound dressing: Secondary | ICD-10-CM | POA: Diagnosis not present

## 2015-08-15 DIAGNOSIS — I10 Essential (primary) hypertension: Secondary | ICD-10-CM | POA: Diagnosis not present

## 2015-08-15 DIAGNOSIS — M542 Cervicalgia: Secondary | ICD-10-CM | POA: Diagnosis not present

## 2015-08-15 DIAGNOSIS — M5412 Radiculopathy, cervical region: Secondary | ICD-10-CM | POA: Diagnosis not present

## 2015-08-15 DIAGNOSIS — I872 Venous insufficiency (chronic) (peripheral): Secondary | ICD-10-CM | POA: Diagnosis not present

## 2015-08-15 DIAGNOSIS — Z7982 Long term (current) use of aspirin: Secondary | ICD-10-CM | POA: Diagnosis not present

## 2015-08-15 DIAGNOSIS — Z79891 Long term (current) use of opiate analgesic: Secondary | ICD-10-CM | POA: Diagnosis not present

## 2015-08-15 DIAGNOSIS — Z8673 Personal history of transient ischemic attack (TIA), and cerebral infarction without residual deficits: Secondary | ICD-10-CM | POA: Diagnosis not present

## 2015-08-15 DIAGNOSIS — I110 Hypertensive heart disease with heart failure: Secondary | ICD-10-CM | POA: Diagnosis not present

## 2015-08-15 DIAGNOSIS — I5030 Unspecified diastolic (congestive) heart failure: Secondary | ICD-10-CM | POA: Diagnosis not present

## 2015-08-15 DIAGNOSIS — M4322 Fusion of spine, cervical region: Secondary | ICD-10-CM | POA: Diagnosis not present

## 2015-08-18 DIAGNOSIS — I110 Hypertensive heart disease with heart failure: Secondary | ICD-10-CM | POA: Diagnosis not present

## 2015-08-18 DIAGNOSIS — Z4800 Encounter for change or removal of nonsurgical wound dressing: Secondary | ICD-10-CM | POA: Diagnosis not present

## 2015-08-18 DIAGNOSIS — I872 Venous insufficiency (chronic) (peripheral): Secondary | ICD-10-CM | POA: Diagnosis not present

## 2015-08-18 DIAGNOSIS — Z8673 Personal history of transient ischemic attack (TIA), and cerebral infarction without residual deficits: Secondary | ICD-10-CM | POA: Diagnosis not present

## 2015-08-18 DIAGNOSIS — M4322 Fusion of spine, cervical region: Secondary | ICD-10-CM | POA: Diagnosis not present

## 2015-08-18 DIAGNOSIS — Z86718 Personal history of other venous thrombosis and embolism: Secondary | ICD-10-CM | POA: Diagnosis not present

## 2015-08-18 DIAGNOSIS — I5030 Unspecified diastolic (congestive) heart failure: Secondary | ICD-10-CM | POA: Diagnosis not present

## 2015-08-18 DIAGNOSIS — Z7982 Long term (current) use of aspirin: Secondary | ICD-10-CM | POA: Diagnosis not present

## 2015-08-18 DIAGNOSIS — Z79891 Long term (current) use of opiate analgesic: Secondary | ICD-10-CM | POA: Diagnosis not present

## 2015-08-19 DIAGNOSIS — R531 Weakness: Secondary | ICD-10-CM | POA: Diagnosis not present

## 2015-08-20 DIAGNOSIS — I10 Essential (primary) hypertension: Secondary | ICD-10-CM | POA: Diagnosis not present

## 2015-08-20 DIAGNOSIS — L97211 Non-pressure chronic ulcer of right calf limited to breakdown of skin: Secondary | ICD-10-CM | POA: Diagnosis not present

## 2015-08-20 DIAGNOSIS — Z8673 Personal history of transient ischemic attack (TIA), and cerebral infarction without residual deficits: Secondary | ICD-10-CM | POA: Diagnosis not present

## 2015-08-20 DIAGNOSIS — I890 Lymphedema, not elsewhere classified: Secondary | ICD-10-CM | POA: Diagnosis not present

## 2015-08-20 DIAGNOSIS — L97821 Non-pressure chronic ulcer of other part of left lower leg limited to breakdown of skin: Secondary | ICD-10-CM | POA: Diagnosis not present

## 2015-08-20 DIAGNOSIS — S91301A Unspecified open wound, right foot, initial encounter: Secondary | ICD-10-CM | POA: Diagnosis not present

## 2015-08-20 DIAGNOSIS — L97221 Non-pressure chronic ulcer of left calf limited to breakdown of skin: Secondary | ICD-10-CM | POA: Diagnosis not present

## 2015-08-20 DIAGNOSIS — S91302A Unspecified open wound, left foot, initial encounter: Secondary | ICD-10-CM | POA: Diagnosis not present

## 2015-08-20 DIAGNOSIS — L97511 Non-pressure chronic ulcer of other part of right foot limited to breakdown of skin: Secondary | ICD-10-CM | POA: Diagnosis not present

## 2015-08-20 DIAGNOSIS — L97811 Non-pressure chronic ulcer of other part of right lower leg limited to breakdown of skin: Secondary | ICD-10-CM | POA: Diagnosis not present

## 2015-08-20 DIAGNOSIS — S81801A Unspecified open wound, right lower leg, initial encounter: Secondary | ICD-10-CM | POA: Diagnosis not present

## 2015-08-20 DIAGNOSIS — M1990 Unspecified osteoarthritis, unspecified site: Secondary | ICD-10-CM | POA: Diagnosis not present

## 2015-08-20 DIAGNOSIS — L97521 Non-pressure chronic ulcer of other part of left foot limited to breakdown of skin: Secondary | ICD-10-CM | POA: Diagnosis not present

## 2015-08-20 DIAGNOSIS — S81802A Unspecified open wound, left lower leg, initial encounter: Secondary | ICD-10-CM | POA: Diagnosis not present

## 2015-08-21 DIAGNOSIS — R531 Weakness: Secondary | ICD-10-CM | POA: Diagnosis not present

## 2015-08-22 DIAGNOSIS — M4322 Fusion of spine, cervical region: Secondary | ICD-10-CM | POA: Diagnosis not present

## 2015-08-22 DIAGNOSIS — I110 Hypertensive heart disease with heart failure: Secondary | ICD-10-CM | POA: Diagnosis not present

## 2015-08-22 DIAGNOSIS — Z86718 Personal history of other venous thrombosis and embolism: Secondary | ICD-10-CM | POA: Diagnosis not present

## 2015-08-22 DIAGNOSIS — I872 Venous insufficiency (chronic) (peripheral): Secondary | ICD-10-CM | POA: Diagnosis not present

## 2015-08-22 DIAGNOSIS — I5030 Unspecified diastolic (congestive) heart failure: Secondary | ICD-10-CM | POA: Diagnosis not present

## 2015-08-22 DIAGNOSIS — Z8673 Personal history of transient ischemic attack (TIA), and cerebral infarction without residual deficits: Secondary | ICD-10-CM | POA: Diagnosis not present

## 2015-08-22 DIAGNOSIS — Z79891 Long term (current) use of opiate analgesic: Secondary | ICD-10-CM | POA: Diagnosis not present

## 2015-08-22 DIAGNOSIS — Z7982 Long term (current) use of aspirin: Secondary | ICD-10-CM | POA: Diagnosis not present

## 2015-08-22 DIAGNOSIS — Z4800 Encounter for change or removal of nonsurgical wound dressing: Secondary | ICD-10-CM | POA: Diagnosis not present

## 2015-08-25 DIAGNOSIS — Z86718 Personal history of other venous thrombosis and embolism: Secondary | ICD-10-CM | POA: Diagnosis not present

## 2015-08-25 DIAGNOSIS — Z4800 Encounter for change or removal of nonsurgical wound dressing: Secondary | ICD-10-CM | POA: Diagnosis not present

## 2015-08-25 DIAGNOSIS — Z7982 Long term (current) use of aspirin: Secondary | ICD-10-CM | POA: Diagnosis not present

## 2015-08-25 DIAGNOSIS — Z8673 Personal history of transient ischemic attack (TIA), and cerebral infarction without residual deficits: Secondary | ICD-10-CM | POA: Diagnosis not present

## 2015-08-25 DIAGNOSIS — M4322 Fusion of spine, cervical region: Secondary | ICD-10-CM | POA: Diagnosis not present

## 2015-08-25 DIAGNOSIS — I110 Hypertensive heart disease with heart failure: Secondary | ICD-10-CM | POA: Diagnosis not present

## 2015-08-25 DIAGNOSIS — Z79891 Long term (current) use of opiate analgesic: Secondary | ICD-10-CM | POA: Diagnosis not present

## 2015-08-25 DIAGNOSIS — I872 Venous insufficiency (chronic) (peripheral): Secondary | ICD-10-CM | POA: Diagnosis not present

## 2015-08-25 DIAGNOSIS — I5030 Unspecified diastolic (congestive) heart failure: Secondary | ICD-10-CM | POA: Diagnosis not present

## 2015-08-26 DIAGNOSIS — R531 Weakness: Secondary | ICD-10-CM | POA: Diagnosis not present

## 2015-08-27 ENCOUNTER — Encounter (HOSPITAL_BASED_OUTPATIENT_CLINIC_OR_DEPARTMENT_OTHER): Payer: Medicare Other | Attending: Surgery

## 2015-08-27 DIAGNOSIS — S91302A Unspecified open wound, left foot, initial encounter: Secondary | ICD-10-CM | POA: Diagnosis not present

## 2015-08-27 DIAGNOSIS — M1990 Unspecified osteoarthritis, unspecified site: Secondary | ICD-10-CM | POA: Insufficient documentation

## 2015-08-27 DIAGNOSIS — L97221 Non-pressure chronic ulcer of left calf limited to breakdown of skin: Secondary | ICD-10-CM | POA: Diagnosis not present

## 2015-08-27 DIAGNOSIS — S81802A Unspecified open wound, left lower leg, initial encounter: Secondary | ICD-10-CM | POA: Diagnosis not present

## 2015-08-27 DIAGNOSIS — L97211 Non-pressure chronic ulcer of right calf limited to breakdown of skin: Secondary | ICD-10-CM | POA: Diagnosis not present

## 2015-08-27 DIAGNOSIS — L97521 Non-pressure chronic ulcer of other part of left foot limited to breakdown of skin: Secondary | ICD-10-CM | POA: Insufficient documentation

## 2015-08-27 DIAGNOSIS — S91301A Unspecified open wound, right foot, initial encounter: Secondary | ICD-10-CM | POA: Diagnosis not present

## 2015-08-27 DIAGNOSIS — I890 Lymphedema, not elsewhere classified: Secondary | ICD-10-CM | POA: Diagnosis not present

## 2015-08-27 DIAGNOSIS — S81801A Unspecified open wound, right lower leg, initial encounter: Secondary | ICD-10-CM | POA: Diagnosis not present

## 2015-08-27 DIAGNOSIS — L97821 Non-pressure chronic ulcer of other part of left lower leg limited to breakdown of skin: Secondary | ICD-10-CM | POA: Insufficient documentation

## 2015-08-27 DIAGNOSIS — L97811 Non-pressure chronic ulcer of other part of right lower leg limited to breakdown of skin: Secondary | ICD-10-CM | POA: Insufficient documentation

## 2015-08-27 DIAGNOSIS — I10 Essential (primary) hypertension: Secondary | ICD-10-CM | POA: Diagnosis not present

## 2015-08-27 DIAGNOSIS — L97511 Non-pressure chronic ulcer of other part of right foot limited to breakdown of skin: Secondary | ICD-10-CM | POA: Diagnosis not present

## 2015-08-28 DIAGNOSIS — R531 Weakness: Secondary | ICD-10-CM | POA: Diagnosis not present

## 2015-09-02 DIAGNOSIS — R531 Weakness: Secondary | ICD-10-CM | POA: Diagnosis not present

## 2015-09-03 DIAGNOSIS — L97221 Non-pressure chronic ulcer of left calf limited to breakdown of skin: Secondary | ICD-10-CM | POA: Diagnosis not present

## 2015-09-03 DIAGNOSIS — L97521 Non-pressure chronic ulcer of other part of left foot limited to breakdown of skin: Secondary | ICD-10-CM | POA: Diagnosis not present

## 2015-09-03 DIAGNOSIS — L97211 Non-pressure chronic ulcer of right calf limited to breakdown of skin: Secondary | ICD-10-CM | POA: Diagnosis not present

## 2015-09-03 DIAGNOSIS — I890 Lymphedema, not elsewhere classified: Secondary | ICD-10-CM | POA: Diagnosis not present

## 2015-09-03 DIAGNOSIS — L97511 Non-pressure chronic ulcer of other part of right foot limited to breakdown of skin: Secondary | ICD-10-CM | POA: Diagnosis not present

## 2015-09-03 DIAGNOSIS — I10 Essential (primary) hypertension: Secondary | ICD-10-CM | POA: Diagnosis not present

## 2015-09-03 DIAGNOSIS — L97811 Non-pressure chronic ulcer of other part of right lower leg limited to breakdown of skin: Secondary | ICD-10-CM | POA: Diagnosis not present

## 2015-09-03 DIAGNOSIS — M1990 Unspecified osteoarthritis, unspecified site: Secondary | ICD-10-CM | POA: Diagnosis not present

## 2015-09-03 DIAGNOSIS — L97821 Non-pressure chronic ulcer of other part of left lower leg limited to breakdown of skin: Secondary | ICD-10-CM | POA: Diagnosis not present

## 2015-09-04 DIAGNOSIS — R531 Weakness: Secondary | ICD-10-CM | POA: Diagnosis not present

## 2015-09-05 DIAGNOSIS — Z8673 Personal history of transient ischemic attack (TIA), and cerebral infarction without residual deficits: Secondary | ICD-10-CM | POA: Diagnosis not present

## 2015-09-05 DIAGNOSIS — Z86718 Personal history of other venous thrombosis and embolism: Secondary | ICD-10-CM | POA: Diagnosis not present

## 2015-09-05 DIAGNOSIS — M4322 Fusion of spine, cervical region: Secondary | ICD-10-CM | POA: Diagnosis not present

## 2015-09-05 DIAGNOSIS — I110 Hypertensive heart disease with heart failure: Secondary | ICD-10-CM | POA: Diagnosis not present

## 2015-09-05 DIAGNOSIS — I5030 Unspecified diastolic (congestive) heart failure: Secondary | ICD-10-CM | POA: Diagnosis not present

## 2015-09-05 DIAGNOSIS — Z7982 Long term (current) use of aspirin: Secondary | ICD-10-CM | POA: Diagnosis not present

## 2015-09-05 DIAGNOSIS — Z4800 Encounter for change or removal of nonsurgical wound dressing: Secondary | ICD-10-CM | POA: Diagnosis not present

## 2015-09-05 DIAGNOSIS — I872 Venous insufficiency (chronic) (peripheral): Secondary | ICD-10-CM | POA: Diagnosis not present

## 2015-09-05 DIAGNOSIS — Z79891 Long term (current) use of opiate analgesic: Secondary | ICD-10-CM | POA: Diagnosis not present

## 2015-09-08 DIAGNOSIS — Z4800 Encounter for change or removal of nonsurgical wound dressing: Secondary | ICD-10-CM | POA: Diagnosis not present

## 2015-09-08 DIAGNOSIS — M4322 Fusion of spine, cervical region: Secondary | ICD-10-CM | POA: Diagnosis not present

## 2015-09-08 DIAGNOSIS — I5030 Unspecified diastolic (congestive) heart failure: Secondary | ICD-10-CM | POA: Diagnosis not present

## 2015-09-08 DIAGNOSIS — Z7982 Long term (current) use of aspirin: Secondary | ICD-10-CM | POA: Diagnosis not present

## 2015-09-08 DIAGNOSIS — I110 Hypertensive heart disease with heart failure: Secondary | ICD-10-CM | POA: Diagnosis not present

## 2015-09-08 DIAGNOSIS — Z79891 Long term (current) use of opiate analgesic: Secondary | ICD-10-CM | POA: Diagnosis not present

## 2015-09-08 DIAGNOSIS — Z86718 Personal history of other venous thrombosis and embolism: Secondary | ICD-10-CM | POA: Diagnosis not present

## 2015-09-08 DIAGNOSIS — I872 Venous insufficiency (chronic) (peripheral): Secondary | ICD-10-CM | POA: Diagnosis not present

## 2015-09-08 DIAGNOSIS — Z8673 Personal history of transient ischemic attack (TIA), and cerebral infarction without residual deficits: Secondary | ICD-10-CM | POA: Diagnosis not present

## 2015-09-09 ENCOUNTER — Ambulatory Visit: Payer: Medicare Other | Admitting: Podiatry

## 2015-09-09 DIAGNOSIS — R531 Weakness: Secondary | ICD-10-CM | POA: Diagnosis not present

## 2015-09-10 DIAGNOSIS — L97821 Non-pressure chronic ulcer of other part of left lower leg limited to breakdown of skin: Secondary | ICD-10-CM | POA: Diagnosis not present

## 2015-09-10 DIAGNOSIS — M1990 Unspecified osteoarthritis, unspecified site: Secondary | ICD-10-CM | POA: Diagnosis not present

## 2015-09-10 DIAGNOSIS — L97521 Non-pressure chronic ulcer of other part of left foot limited to breakdown of skin: Secondary | ICD-10-CM | POA: Diagnosis not present

## 2015-09-10 DIAGNOSIS — L97511 Non-pressure chronic ulcer of other part of right foot limited to breakdown of skin: Secondary | ICD-10-CM | POA: Diagnosis not present

## 2015-09-10 DIAGNOSIS — L97211 Non-pressure chronic ulcer of right calf limited to breakdown of skin: Secondary | ICD-10-CM | POA: Diagnosis not present

## 2015-09-10 DIAGNOSIS — L97811 Non-pressure chronic ulcer of other part of right lower leg limited to breakdown of skin: Secondary | ICD-10-CM | POA: Diagnosis not present

## 2015-09-10 DIAGNOSIS — I890 Lymphedema, not elsewhere classified: Secondary | ICD-10-CM | POA: Diagnosis not present

## 2015-09-10 DIAGNOSIS — I10 Essential (primary) hypertension: Secondary | ICD-10-CM | POA: Diagnosis not present

## 2015-09-10 DIAGNOSIS — L97221 Non-pressure chronic ulcer of left calf limited to breakdown of skin: Secondary | ICD-10-CM | POA: Diagnosis not present

## 2015-09-11 DIAGNOSIS — R531 Weakness: Secondary | ICD-10-CM | POA: Diagnosis not present

## 2015-09-12 DIAGNOSIS — I872 Venous insufficiency (chronic) (peripheral): Secondary | ICD-10-CM | POA: Diagnosis not present

## 2015-09-12 DIAGNOSIS — I110 Hypertensive heart disease with heart failure: Secondary | ICD-10-CM | POA: Diagnosis not present

## 2015-09-12 DIAGNOSIS — I5030 Unspecified diastolic (congestive) heart failure: Secondary | ICD-10-CM | POA: Diagnosis not present

## 2015-09-12 DIAGNOSIS — Z86718 Personal history of other venous thrombosis and embolism: Secondary | ICD-10-CM | POA: Diagnosis not present

## 2015-09-12 DIAGNOSIS — Z7982 Long term (current) use of aspirin: Secondary | ICD-10-CM | POA: Diagnosis not present

## 2015-09-12 DIAGNOSIS — Z4800 Encounter for change or removal of nonsurgical wound dressing: Secondary | ICD-10-CM | POA: Diagnosis not present

## 2015-09-12 DIAGNOSIS — Z79891 Long term (current) use of opiate analgesic: Secondary | ICD-10-CM | POA: Diagnosis not present

## 2015-09-12 DIAGNOSIS — M4322 Fusion of spine, cervical region: Secondary | ICD-10-CM | POA: Diagnosis not present

## 2015-09-12 DIAGNOSIS — Z8673 Personal history of transient ischemic attack (TIA), and cerebral infarction without residual deficits: Secondary | ICD-10-CM | POA: Diagnosis not present

## 2015-09-15 DIAGNOSIS — Z8673 Personal history of transient ischemic attack (TIA), and cerebral infarction without residual deficits: Secondary | ICD-10-CM | POA: Diagnosis not present

## 2015-09-15 DIAGNOSIS — I5030 Unspecified diastolic (congestive) heart failure: Secondary | ICD-10-CM | POA: Diagnosis not present

## 2015-09-15 DIAGNOSIS — Z4800 Encounter for change or removal of nonsurgical wound dressing: Secondary | ICD-10-CM | POA: Diagnosis not present

## 2015-09-15 DIAGNOSIS — Z86718 Personal history of other venous thrombosis and embolism: Secondary | ICD-10-CM | POA: Diagnosis not present

## 2015-09-15 DIAGNOSIS — Z79891 Long term (current) use of opiate analgesic: Secondary | ICD-10-CM | POA: Diagnosis not present

## 2015-09-15 DIAGNOSIS — M4322 Fusion of spine, cervical region: Secondary | ICD-10-CM | POA: Diagnosis not present

## 2015-09-15 DIAGNOSIS — Z7982 Long term (current) use of aspirin: Secondary | ICD-10-CM | POA: Diagnosis not present

## 2015-09-15 DIAGNOSIS — I110 Hypertensive heart disease with heart failure: Secondary | ICD-10-CM | POA: Diagnosis not present

## 2015-09-15 DIAGNOSIS — I872 Venous insufficiency (chronic) (peripheral): Secondary | ICD-10-CM | POA: Diagnosis not present

## 2015-09-16 ENCOUNTER — Other Ambulatory Visit: Payer: Self-pay | Admitting: *Deleted

## 2015-09-16 MED ORDER — TRIAMCINOLONE ACETONIDE 0.1 % EX OINT: 1.0000 "application " | TOPICAL_OINTMENT | Freq: Two times a day (BID) | CUTANEOUS | Status: DC | PRN

## 2015-09-17 DIAGNOSIS — L97811 Non-pressure chronic ulcer of other part of right lower leg limited to breakdown of skin: Secondary | ICD-10-CM | POA: Diagnosis not present

## 2015-09-17 DIAGNOSIS — L97521 Non-pressure chronic ulcer of other part of left foot limited to breakdown of skin: Secondary | ICD-10-CM | POA: Diagnosis not present

## 2015-09-17 DIAGNOSIS — R531 Weakness: Secondary | ICD-10-CM | POA: Diagnosis not present

## 2015-09-17 DIAGNOSIS — L97211 Non-pressure chronic ulcer of right calf limited to breakdown of skin: Secondary | ICD-10-CM | POA: Diagnosis not present

## 2015-09-17 DIAGNOSIS — I10 Essential (primary) hypertension: Secondary | ICD-10-CM | POA: Diagnosis not present

## 2015-09-17 DIAGNOSIS — M1990 Unspecified osteoarthritis, unspecified site: Secondary | ICD-10-CM | POA: Diagnosis not present

## 2015-09-17 DIAGNOSIS — L97221 Non-pressure chronic ulcer of left calf limited to breakdown of skin: Secondary | ICD-10-CM | POA: Diagnosis not present

## 2015-09-17 DIAGNOSIS — I890 Lymphedema, not elsewhere classified: Secondary | ICD-10-CM | POA: Diagnosis not present

## 2015-09-17 DIAGNOSIS — L97511 Non-pressure chronic ulcer of other part of right foot limited to breakdown of skin: Secondary | ICD-10-CM | POA: Diagnosis not present

## 2015-09-17 DIAGNOSIS — L97821 Non-pressure chronic ulcer of other part of left lower leg limited to breakdown of skin: Secondary | ICD-10-CM | POA: Diagnosis not present

## 2015-09-19 ENCOUNTER — Ambulatory Visit (INDEPENDENT_AMBULATORY_CARE_PROVIDER_SITE_OTHER): Payer: Medicare Other | Admitting: Internal Medicine

## 2015-09-19 ENCOUNTER — Encounter: Payer: Self-pay | Admitting: Internal Medicine

## 2015-09-19 VITALS — BP 150/70 | HR 65 | Wt 226.0 lb

## 2015-09-19 DIAGNOSIS — M159 Polyosteoarthritis, unspecified: Secondary | ICD-10-CM

## 2015-09-19 DIAGNOSIS — L97909 Non-pressure chronic ulcer of unspecified part of unspecified lower leg with unspecified severity: Secondary | ICD-10-CM

## 2015-09-19 DIAGNOSIS — I872 Venous insufficiency (chronic) (peripheral): Secondary | ICD-10-CM | POA: Diagnosis not present

## 2015-09-19 DIAGNOSIS — Z4800 Encounter for change or removal of nonsurgical wound dressing: Secondary | ICD-10-CM | POA: Diagnosis not present

## 2015-09-19 DIAGNOSIS — Z8673 Personal history of transient ischemic attack (TIA), and cerebral infarction without residual deficits: Secondary | ICD-10-CM | POA: Diagnosis not present

## 2015-09-19 DIAGNOSIS — R0609 Other forms of dyspnea: Secondary | ICD-10-CM

## 2015-09-19 DIAGNOSIS — I110 Hypertensive heart disease with heart failure: Secondary | ICD-10-CM | POA: Diagnosis not present

## 2015-09-19 DIAGNOSIS — M5000 Cervical disc disorder with myelopathy, unspecified cervical region: Secondary | ICD-10-CM | POA: Diagnosis not present

## 2015-09-19 DIAGNOSIS — Z79891 Long term (current) use of opiate analgesic: Secondary | ICD-10-CM | POA: Diagnosis not present

## 2015-09-19 DIAGNOSIS — M4322 Fusion of spine, cervical region: Secondary | ICD-10-CM | POA: Diagnosis not present

## 2015-09-19 DIAGNOSIS — I5030 Unspecified diastolic (congestive) heart failure: Secondary | ICD-10-CM | POA: Diagnosis not present

## 2015-09-19 DIAGNOSIS — M150 Primary generalized (osteo)arthritis: Secondary | ICD-10-CM

## 2015-09-19 DIAGNOSIS — I83009 Varicose veins of unspecified lower extremity with ulcer of unspecified site: Secondary | ICD-10-CM

## 2015-09-19 DIAGNOSIS — I9589 Other hypotension: Secondary | ICD-10-CM | POA: Diagnosis not present

## 2015-09-19 DIAGNOSIS — Z7982 Long term (current) use of aspirin: Secondary | ICD-10-CM | POA: Diagnosis not present

## 2015-09-19 DIAGNOSIS — Z86718 Personal history of other venous thrombosis and embolism: Secondary | ICD-10-CM | POA: Diagnosis not present

## 2015-09-19 DIAGNOSIS — L309 Dermatitis, unspecified: Secondary | ICD-10-CM

## 2015-09-19 MED ORDER — TRAMADOL-ACETAMINOPHEN 37.5-325 MG PO TABS: 1.0000 | ORAL_TABLET | Freq: Three times a day (TID) | ORAL | Status: DC | PRN

## 2015-09-19 NOTE — Assessment & Plan Note (Signed)
Ultracet prn  Potential benefits of a long term opioids use as well as potential risks (i.e. addiction risk, apnea etc) and complications (i.e. Somnolence, constipation and others) were explained to the patient and were aknowledged. 

## 2015-09-19 NOTE — Progress Notes (Signed)
Pre visit review using our clinic review tool, if applicable. No additional management support is needed unless otherwise documented below in the visit note. 

## 2015-09-19 NOTE — Assessment & Plan Note (Signed)
Per dermatology. 

## 2015-09-19 NOTE — Assessment & Plan Note (Addendum)
EKG - no change Deconditioned - more active now

## 2015-09-19 NOTE — Progress Notes (Signed)
Subjective:  Patient ID: Kurt Sexton, male    DOB: 1941-03-09  Age: 75 y.o. MRN: [id number]  CC: No chief complaint on file.   HPI Kurt Sexton presents for HTN, leg edema, leg wounds f/u. C/o rash - worse  Outpatient Prescriptions Prior to Visit  Medication Sig Dispense Refill  . aspirin 81 MG EC tablet Take 81 mg by mouth daily.      . betamethasone dipropionate (DIPROLENE) 0.05 % cream Apply 1 application topically 2 (two) times daily. Reported on 05/27/2015    . Cholecalciferol (VITAMIN D3) 2000 units capsule Take 1 capsule (2,000 Units total) by mouth daily. 100 capsule 3  . ergocalciferol (VITAMIN D2) 50000 UNITS capsule Take 1 capsule (50,000 Units total) by mouth once a week. 6 capsule 0  . ferrous sulfate 325 (65 FE) MG tablet Take 1 tablet (325 mg total) by mouth 2 (two) times daily with a meal. 100 tablet 3  . hydrOXYzine (ATARAX/VISTARIL) 25 MG tablet Take 1 tablet (25 mg total) by mouth every 8 (eight) hours as needed for itching or nausea. 60 tablet 1  . labetalol (NORMODYNE) 200 MG tablet Take 1 tablet (200 mg total) by mouth 3 (three) times daily. 180 tablet 3  . Magnesium Hydroxide (MILK OF MAGNESIA PO) Take by mouth. If no BM in 3 days give 30 cc milk of magnesium p.o. 1 dose in 24 hours as needed    . meclizine (ANTIVERT) 12.5 MG tablet Take 12.5 mg by mouth every 6 (six) hours as needed for dizziness.    Marland Kitchen tiZANidine (ZANAFLEX) 2 MG tablet Take 1 tablet (2 mg total) by mouth at bedtime as needed for muscle spasms. 30 tablet 5  . torsemide (DEMADEX) 20 MG tablet Take 1 tablet (20 mg total) by mouth daily. 30 tablet 2  . traMADol-acetaminophen (ULTRACET) 37.5-325 MG tablet Take 1 tablet by mouth every 8 (eight) hours as needed for severe pain. 60 tablet 1  . triamcinolone ointment (KENALOG) 0.1 % Apply 1 application topically 2 (two) times daily as needed (skin irritation). 80 g 2   No facility-administered medications prior to visit.    ROS Review of Systems    Constitutional: Positive for fatigue. Negative for appetite change and unexpected weight change.  HENT: Negative for congestion, nosebleeds, sneezing, sore throat and trouble swallowing.   Eyes: Negative for itching and visual disturbance.  Respiratory: Negative for cough.   Cardiovascular: Positive for leg swelling. Negative for chest pain and palpitations.  Gastrointestinal: Negative for nausea, diarrhea, blood in stool and abdominal distention.  Genitourinary: Negative for frequency and hematuria.  Musculoskeletal: Negative for back pain, joint swelling, gait problem and neck pain.  Skin: Positive for color change, rash and wound.  Neurological: Negative for dizziness, tremors, speech difficulty and weakness.  Psychiatric/Behavioral: Negative for sleep disturbance, dysphoric mood and agitation. The patient is not nervous/anxious.     Objective:  BP 150/70 mmHg  Pulse 65  Wt 226 lb (102.513 kg)  SpO2 97%  BP Readings from Last 3 Encounters:  09/19/15 150/70  08/07/15 168/70  07/24/15 140/80    Wt Readings from Last 3 Encounters:  09/19/15 226 lb (102.513 kg)  08/07/15 223 lb (101.152 kg)  07/24/15 217 lb 4 oz (98.544 kg)    Physical Exam  Constitutional: He is oriented to person, place, and time. He appears well-developed. No distress.  NAD  HENT:  Mouth/Throat: Oropharynx is clear and moist.  Eyes: Conjunctivae are normal. Pupils are equal, round, and  reactive to light.  Neck: Normal range of motion. No JVD present. No thyromegaly present.  Cardiovascular: Normal rate, regular rhythm, normal heart sounds and intact distal pulses.  Exam reveals no gallop and no friction rub.   No murmur heard. Pulmonary/Chest: Effort normal and breath sounds normal. No respiratory distress. He has no wheezes. He has no rales. He exhibits no tenderness.  Abdominal: Soft. Bowel sounds are normal. He exhibits no distension and no mass. There is no tenderness. There is no rebound and no  guarding.  Musculoskeletal: Normal range of motion. He exhibits edema. He exhibits no tenderness.  Lymphadenopathy:    He has no cervical adenopathy.  Neurological: He is alert and oriented to person, place, and time. He has normal reflexes. No cranial nerve deficit. He exhibits normal muscle tone. He displays a negative Romberg sign. Coordination abnormal. Gait normal.  Skin: Skin is warm and dry. No rash noted. No erythema.  Psychiatric: He has a normal mood and affect. His behavior is normal. Judgment and thought content normal.  Cane Eczema patches LE wounds are dressed  Procedure: EKG Indication: DOE - chronic Impression: NSR. IRBBB. No acute changes.   Lab Results  Component Value Date   WBC 5.8 06/25/2015   HGB 11.1* 06/25/2015   HCT 35.0* 06/25/2015   PLT 287.0 06/25/2015   GLUCOSE 110* 06/25/2015   CHOL 198 06/25/2015   TRIG 121.0 06/25/2015   HDL 39.10 06/25/2015   LDLCALC 135* 06/25/2015   ALT 14 06/25/2015   AST 15 06/25/2015   NA 141 06/25/2015   K 4.5 06/25/2015   CL 104 06/25/2015   CREATININE 1.11 06/25/2015   BUN 17 06/25/2015   CO2 32 06/25/2015   TSH 2.49 06/25/2015   PSA 10.85* 06/25/2015   INR 1.01 10/25/2010   HGBA1C 5.2 06/25/2015    Dg Pelvis 1-2 Views  08/07/2015  CLINICAL DATA:  Fall 5 days ago.  Pelvis pain. EXAM: PELVIS - 1-2 VIEW COMPARISON:  None. FINDINGS: Both hips appear located. There is no evidence for acute fracture or subluxation. Mild bilateral hip osteoarthritis noted. Degenerative disc disease noted within the lumbar spine. IMPRESSION: 1. No acute findings identified. 2. Degenerative change as above. Electronically Signed   By: Kerby Moors M.D.   On: 08/07/2015 15:34   Ct Head Wo Contrast  08/07/2015  CLINICAL DATA:  Status post fall 5 days ago. Persistent right neck and right hand pain. Initial encounter. EXAM: CT HEAD WITHOUT CONTRAST CT CERVICAL SPINE WITHOUT CONTRAST TECHNIQUE: Multidetector CT imaging of the head and  cervical spine was performed following the standard protocol without intravenous contrast. Multiplanar CT image reconstructions of the cervical spine were also generated. COMPARISON:  Head and cervical spine CT scans 10/24/2014. FINDINGS: CT HEAD FINDINGS There is some cortical atrophy and chronic microvascular ischemic change as seen on the prior exam. No evidence of acute intracranial abnormality including hemorrhage, infarct, mass lesion, mass effect, midline shift or abnormal extra-axial fluid collection is identified. The calvarium is intact. Imaged paranasal sinuses and mastoid air cells are clear. CT CERVICAL SPINE FINDINGS Since the prior CT scan, the patient has undergone C6 corpectomy and C5-7 anterior fusion. No fracture or malalignment is identified. Lung apices are clear. IMPRESSION: No acute abnormality head or cervical spine. Atrophy and chronic microvascular ischemic change. Status post C6-5 7 anterior fusion in C6 corpectomy. Electronically Signed   By: Inge Rise M.D.   On: 08/07/2015 14:39   Ct Cervical Spine Wo Contrast  08/07/2015  CLINICAL DATA:  Status post fall 5 days ago. Persistent right neck and right hand pain. Initial encounter. EXAM: CT HEAD WITHOUT CONTRAST CT CERVICAL SPINE WITHOUT CONTRAST TECHNIQUE: Multidetector CT imaging of the head and cervical spine was performed following the standard protocol without intravenous contrast. Multiplanar CT image reconstructions of the cervical spine were also generated. COMPARISON:  Head and cervical spine CT scans 10/24/2014. FINDINGS: CT HEAD FINDINGS There is some cortical atrophy and chronic microvascular ischemic change as seen on the prior exam. No evidence of acute intracranial abnormality including hemorrhage, infarct, mass lesion, mass effect, midline shift or abnormal extra-axial fluid collection is identified. The calvarium is intact. Imaged paranasal sinuses and mastoid air cells are clear. CT CERVICAL SPINE FINDINGS Since  the prior CT scan, the patient has undergone C6 corpectomy and C5-7 anterior fusion. No fracture or malalignment is identified. Lung apices are clear. IMPRESSION: No acute abnormality head or cervical spine. Atrophy and chronic microvascular ischemic change. Status post C6-5 7 anterior fusion in C6 corpectomy. Electronically Signed   By: Inge Rise M.D.   On: 08/07/2015 14:39    Assessment & Plan:   There are no diagnoses linked to this encounter. I am having Mr. Meth maintain his aspirin, hydrOXYzine, betamethasone dipropionate, labetalol, meclizine, Magnesium Hydroxide (MILK OF MAGNESIA PO), ergocalciferol, ferrous sulfate, Vitamin D3, torsemide, tiZANidine, traMADol-acetaminophen, and triamcinolone ointment.  No orders of the defined types were placed in this encounter.     Follow-up: No Follow-up on file.  [name], MD

## 2015-09-19 NOTE — Assessment & Plan Note (Signed)
Wound clinic f/u

## 2015-09-22 DIAGNOSIS — R531 Weakness: Secondary | ICD-10-CM | POA: Diagnosis not present

## 2015-09-24 ENCOUNTER — Encounter (HOSPITAL_BASED_OUTPATIENT_CLINIC_OR_DEPARTMENT_OTHER): Payer: Medicare Other | Attending: Surgery

## 2015-09-24 DIAGNOSIS — L309 Dermatitis, unspecified: Secondary | ICD-10-CM | POA: Diagnosis not present

## 2015-09-24 DIAGNOSIS — S91301A Unspecified open wound, right foot, initial encounter: Secondary | ICD-10-CM | POA: Insufficient documentation

## 2015-09-24 DIAGNOSIS — I890 Lymphedema, not elsewhere classified: Secondary | ICD-10-CM | POA: Insufficient documentation

## 2015-09-24 DIAGNOSIS — L97821 Non-pressure chronic ulcer of other part of left lower leg limited to breakdown of skin: Secondary | ICD-10-CM | POA: Diagnosis not present

## 2015-09-24 DIAGNOSIS — X58XXXA Exposure to other specified factors, initial encounter: Secondary | ICD-10-CM | POA: Insufficient documentation

## 2015-09-24 DIAGNOSIS — R531 Weakness: Secondary | ICD-10-CM | POA: Diagnosis not present

## 2015-09-24 DIAGNOSIS — Z8673 Personal history of transient ischemic attack (TIA), and cerebral infarction without residual deficits: Secondary | ICD-10-CM | POA: Diagnosis not present

## 2015-09-24 DIAGNOSIS — L97811 Non-pressure chronic ulcer of other part of right lower leg limited to breakdown of skin: Secondary | ICD-10-CM | POA: Insufficient documentation

## 2015-09-24 DIAGNOSIS — I10 Essential (primary) hypertension: Secondary | ICD-10-CM | POA: Insufficient documentation

## 2015-09-24 DIAGNOSIS — Z8546 Personal history of malignant neoplasm of prostate: Secondary | ICD-10-CM | POA: Insufficient documentation

## 2015-09-24 DIAGNOSIS — S91302A Unspecified open wound, left foot, initial encounter: Secondary | ICD-10-CM | POA: Insufficient documentation

## 2015-09-26 DIAGNOSIS — Z86718 Personal history of other venous thrombosis and embolism: Secondary | ICD-10-CM | POA: Diagnosis not present

## 2015-09-26 DIAGNOSIS — Z4800 Encounter for change or removal of nonsurgical wound dressing: Secondary | ICD-10-CM | POA: Diagnosis not present

## 2015-09-26 DIAGNOSIS — I110 Hypertensive heart disease with heart failure: Secondary | ICD-10-CM | POA: Diagnosis not present

## 2015-09-26 DIAGNOSIS — I872 Venous insufficiency (chronic) (peripheral): Secondary | ICD-10-CM | POA: Diagnosis not present

## 2015-09-26 DIAGNOSIS — M4322 Fusion of spine, cervical region: Secondary | ICD-10-CM | POA: Diagnosis not present

## 2015-09-26 DIAGNOSIS — Z8673 Personal history of transient ischemic attack (TIA), and cerebral infarction without residual deficits: Secondary | ICD-10-CM | POA: Diagnosis not present

## 2015-09-26 DIAGNOSIS — Z79891 Long term (current) use of opiate analgesic: Secondary | ICD-10-CM | POA: Diagnosis not present

## 2015-09-26 DIAGNOSIS — I5030 Unspecified diastolic (congestive) heart failure: Secondary | ICD-10-CM | POA: Diagnosis not present

## 2015-09-26 DIAGNOSIS — Z7982 Long term (current) use of aspirin: Secondary | ICD-10-CM | POA: Diagnosis not present

## 2015-09-29 DIAGNOSIS — Z8673 Personal history of transient ischemic attack (TIA), and cerebral infarction without residual deficits: Secondary | ICD-10-CM | POA: Diagnosis not present

## 2015-09-29 DIAGNOSIS — Z7982 Long term (current) use of aspirin: Secondary | ICD-10-CM | POA: Diagnosis not present

## 2015-09-29 DIAGNOSIS — I872 Venous insufficiency (chronic) (peripheral): Secondary | ICD-10-CM | POA: Diagnosis not present

## 2015-09-29 DIAGNOSIS — I110 Hypertensive heart disease with heart failure: Secondary | ICD-10-CM | POA: Diagnosis not present

## 2015-09-29 DIAGNOSIS — M4322 Fusion of spine, cervical region: Secondary | ICD-10-CM | POA: Diagnosis not present

## 2015-09-29 DIAGNOSIS — I5030 Unspecified diastolic (congestive) heart failure: Secondary | ICD-10-CM | POA: Diagnosis not present

## 2015-09-29 DIAGNOSIS — Z4800 Encounter for change or removal of nonsurgical wound dressing: Secondary | ICD-10-CM | POA: Diagnosis not present

## 2015-09-29 DIAGNOSIS — Z86718 Personal history of other venous thrombosis and embolism: Secondary | ICD-10-CM | POA: Diagnosis not present

## 2015-09-29 DIAGNOSIS — Z79891 Long term (current) use of opiate analgesic: Secondary | ICD-10-CM | POA: Diagnosis not present

## 2015-10-01 ENCOUNTER — Telehealth: Payer: Self-pay

## 2015-10-01 DIAGNOSIS — S91302A Unspecified open wound, left foot, initial encounter: Secondary | ICD-10-CM | POA: Diagnosis not present

## 2015-10-01 DIAGNOSIS — S91301A Unspecified open wound, right foot, initial encounter: Secondary | ICD-10-CM | POA: Diagnosis not present

## 2015-10-01 DIAGNOSIS — L309 Dermatitis, unspecified: Secondary | ICD-10-CM | POA: Diagnosis not present

## 2015-10-01 DIAGNOSIS — I890 Lymphedema, not elsewhere classified: Secondary | ICD-10-CM | POA: Diagnosis not present

## 2015-10-01 DIAGNOSIS — S81802A Unspecified open wound, left lower leg, initial encounter: Secondary | ICD-10-CM | POA: Diagnosis not present

## 2015-10-01 DIAGNOSIS — Z8673 Personal history of transient ischemic attack (TIA), and cerebral infarction without residual deficits: Secondary | ICD-10-CM | POA: Diagnosis not present

## 2015-10-01 DIAGNOSIS — L97811 Non-pressure chronic ulcer of other part of right lower leg limited to breakdown of skin: Secondary | ICD-10-CM | POA: Diagnosis not present

## 2015-10-01 DIAGNOSIS — L97821 Non-pressure chronic ulcer of other part of left lower leg limited to breakdown of skin: Secondary | ICD-10-CM | POA: Diagnosis not present

## 2015-10-01 DIAGNOSIS — S81801A Unspecified open wound, right lower leg, initial encounter: Secondary | ICD-10-CM | POA: Diagnosis not present

## 2015-10-01 DIAGNOSIS — I10 Essential (primary) hypertension: Secondary | ICD-10-CM | POA: Diagnosis not present

## 2015-10-01 NOTE — Telephone Encounter (Signed)
Home Health Cert/Plan of Care received (09/10/2015 - 11/08/2015) and placed on MD's desk for signature

## 2015-10-02 DIAGNOSIS — R531 Weakness: Secondary | ICD-10-CM | POA: Diagnosis not present

## 2015-10-06 DIAGNOSIS — I5030 Unspecified diastolic (congestive) heart failure: Secondary | ICD-10-CM | POA: Diagnosis not present

## 2015-10-06 DIAGNOSIS — Z7982 Long term (current) use of aspirin: Secondary | ICD-10-CM | POA: Diagnosis not present

## 2015-10-06 DIAGNOSIS — Z8673 Personal history of transient ischemic attack (TIA), and cerebral infarction without residual deficits: Secondary | ICD-10-CM | POA: Diagnosis not present

## 2015-10-06 DIAGNOSIS — I110 Hypertensive heart disease with heart failure: Secondary | ICD-10-CM | POA: Diagnosis not present

## 2015-10-06 DIAGNOSIS — Z4800 Encounter for change or removal of nonsurgical wound dressing: Secondary | ICD-10-CM | POA: Diagnosis not present

## 2015-10-06 DIAGNOSIS — Z79891 Long term (current) use of opiate analgesic: Secondary | ICD-10-CM | POA: Diagnosis not present

## 2015-10-06 DIAGNOSIS — M4322 Fusion of spine, cervical region: Secondary | ICD-10-CM | POA: Diagnosis not present

## 2015-10-06 DIAGNOSIS — I872 Venous insufficiency (chronic) (peripheral): Secondary | ICD-10-CM | POA: Diagnosis not present

## 2015-10-06 DIAGNOSIS — Z86718 Personal history of other venous thrombosis and embolism: Secondary | ICD-10-CM | POA: Diagnosis not present

## 2015-10-07 DIAGNOSIS — R531 Weakness: Secondary | ICD-10-CM | POA: Diagnosis not present

## 2015-10-07 NOTE — Telephone Encounter (Signed)
Paperwork signed, faxed, copy sent to scan 

## 2015-10-08 DIAGNOSIS — L97811 Non-pressure chronic ulcer of other part of right lower leg limited to breakdown of skin: Secondary | ICD-10-CM | POA: Diagnosis not present

## 2015-10-08 DIAGNOSIS — L309 Dermatitis, unspecified: Secondary | ICD-10-CM | POA: Diagnosis not present

## 2015-10-08 DIAGNOSIS — B49 Unspecified mycosis: Secondary | ICD-10-CM | POA: Diagnosis not present

## 2015-10-08 DIAGNOSIS — I10 Essential (primary) hypertension: Secondary | ICD-10-CM | POA: Diagnosis not present

## 2015-10-08 DIAGNOSIS — S91301A Unspecified open wound, right foot, initial encounter: Secondary | ICD-10-CM | POA: Diagnosis not present

## 2015-10-08 DIAGNOSIS — Z8673 Personal history of transient ischemic attack (TIA), and cerebral infarction without residual deficits: Secondary | ICD-10-CM | POA: Diagnosis not present

## 2015-10-08 DIAGNOSIS — L97821 Non-pressure chronic ulcer of other part of left lower leg limited to breakdown of skin: Secondary | ICD-10-CM | POA: Diagnosis not present

## 2015-10-08 DIAGNOSIS — I890 Lymphedema, not elsewhere classified: Secondary | ICD-10-CM | POA: Diagnosis not present

## 2015-10-08 DIAGNOSIS — S81801A Unspecified open wound, right lower leg, initial encounter: Secondary | ICD-10-CM | POA: Diagnosis not present

## 2015-10-08 DIAGNOSIS — S91302A Unspecified open wound, left foot, initial encounter: Secondary | ICD-10-CM | POA: Diagnosis not present

## 2015-10-08 DIAGNOSIS — S81802A Unspecified open wound, left lower leg, initial encounter: Secondary | ICD-10-CM | POA: Diagnosis not present

## 2015-10-14 ENCOUNTER — Ambulatory Visit: Payer: Medicare Other | Admitting: Podiatry

## 2015-10-14 DIAGNOSIS — R531 Weakness: Secondary | ICD-10-CM | POA: Diagnosis not present

## 2015-10-15 DIAGNOSIS — L97221 Non-pressure chronic ulcer of left calf limited to breakdown of skin: Secondary | ICD-10-CM | POA: Diagnosis not present

## 2015-10-15 DIAGNOSIS — I10 Essential (primary) hypertension: Secondary | ICD-10-CM | POA: Diagnosis not present

## 2015-10-15 DIAGNOSIS — L309 Dermatitis, unspecified: Secondary | ICD-10-CM | POA: Diagnosis not present

## 2015-10-15 DIAGNOSIS — S91301A Unspecified open wound, right foot, initial encounter: Secondary | ICD-10-CM | POA: Diagnosis not present

## 2015-10-15 DIAGNOSIS — S81811A Laceration without foreign body, right lower leg, initial encounter: Secondary | ICD-10-CM | POA: Diagnosis not present

## 2015-10-15 DIAGNOSIS — L97211 Non-pressure chronic ulcer of right calf limited to breakdown of skin: Secondary | ICD-10-CM | POA: Diagnosis not present

## 2015-10-15 DIAGNOSIS — Z8673 Personal history of transient ischemic attack (TIA), and cerebral infarction without residual deficits: Secondary | ICD-10-CM | POA: Diagnosis not present

## 2015-10-15 DIAGNOSIS — L97811 Non-pressure chronic ulcer of other part of right lower leg limited to breakdown of skin: Secondary | ICD-10-CM | POA: Diagnosis not present

## 2015-10-15 DIAGNOSIS — I890 Lymphedema, not elsewhere classified: Secondary | ICD-10-CM | POA: Diagnosis not present

## 2015-10-15 DIAGNOSIS — S91302A Unspecified open wound, left foot, initial encounter: Secondary | ICD-10-CM | POA: Diagnosis not present

## 2015-10-15 DIAGNOSIS — L97821 Non-pressure chronic ulcer of other part of left lower leg limited to breakdown of skin: Secondary | ICD-10-CM | POA: Diagnosis not present

## 2015-10-16 ENCOUNTER — Encounter: Payer: Self-pay | Admitting: Neurology

## 2015-10-16 ENCOUNTER — Ambulatory Visit (INDEPENDENT_AMBULATORY_CARE_PROVIDER_SITE_OTHER): Payer: Medicare Other | Admitting: Neurology

## 2015-10-16 VITALS — BP 148/80 | HR 82 | Ht 76.0 in | Wt 220.2 lb

## 2015-10-16 DIAGNOSIS — M5412 Radiculopathy, cervical region: Secondary | ICD-10-CM

## 2015-10-16 DIAGNOSIS — R531 Weakness: Secondary | ICD-10-CM | POA: Diagnosis not present

## 2015-10-16 DIAGNOSIS — M48061 Spinal stenosis, lumbar region without neurogenic claudication: Secondary | ICD-10-CM

## 2015-10-16 DIAGNOSIS — M4806 Spinal stenosis, lumbar region: Secondary | ICD-10-CM | POA: Diagnosis not present

## 2015-10-16 DIAGNOSIS — G609 Hereditary and idiopathic neuropathy, unspecified: Secondary | ICD-10-CM | POA: Diagnosis not present
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
## 2015-10-16 NOTE — Patient Instructions (Addendum)
You look great!  Keep up the hard work with your home exercises  You can try Argan oil (100%) to your hands to see if this helps with healing  Return to clinic as needed

## 2015-10-22 ENCOUNTER — Encounter (HOSPITAL_BASED_OUTPATIENT_CLINIC_OR_DEPARTMENT_OTHER): Payer: Medicare Other | Attending: Surgery

## 2015-10-22 DIAGNOSIS — L97521 Non-pressure chronic ulcer of other part of left foot limited to breakdown of skin: Secondary | ICD-10-CM | POA: Diagnosis not present

## 2015-10-22 DIAGNOSIS — S81811A Laceration without foreign body, right lower leg, initial encounter: Secondary | ICD-10-CM | POA: Diagnosis not present

## 2015-10-22 DIAGNOSIS — L97821 Non-pressure chronic ulcer of other part of left lower leg limited to breakdown of skin: Secondary | ICD-10-CM | POA: Insufficient documentation

## 2015-10-22 DIAGNOSIS — M1990 Unspecified osteoarthritis, unspecified site: Secondary | ICD-10-CM | POA: Diagnosis not present

## 2015-10-22 DIAGNOSIS — S81802A Unspecified open wound, left lower leg, initial encounter: Secondary | ICD-10-CM | POA: Diagnosis not present

## 2015-10-22 DIAGNOSIS — L97211 Non-pressure chronic ulcer of right calf limited to breakdown of skin: Secondary | ICD-10-CM | POA: Diagnosis not present

## 2015-10-22 DIAGNOSIS — I10 Essential (primary) hypertension: Secondary | ICD-10-CM | POA: Insufficient documentation

## 2015-10-22 DIAGNOSIS — I890 Lymphedema, not elsewhere classified: Secondary | ICD-10-CM | POA: Diagnosis not present

## 2015-10-22 DIAGNOSIS — L97511 Non-pressure chronic ulcer of other part of right foot limited to breakdown of skin: Secondary | ICD-10-CM | POA: Insufficient documentation

## 2015-10-29 DIAGNOSIS — L97511 Non-pressure chronic ulcer of other part of right foot limited to breakdown of skin: Secondary | ICD-10-CM | POA: Diagnosis not present

## 2015-10-29 DIAGNOSIS — I890 Lymphedema, not elsewhere classified: Secondary | ICD-10-CM | POA: Diagnosis not present

## 2015-10-29 DIAGNOSIS — I10 Essential (primary) hypertension: Secondary | ICD-10-CM | POA: Diagnosis not present

## 2015-10-29 DIAGNOSIS — S81801D Unspecified open wound, right lower leg, subsequent encounter: Secondary | ICD-10-CM | POA: Diagnosis not present

## 2015-10-29 DIAGNOSIS — L97211 Non-pressure chronic ulcer of right calf limited to breakdown of skin: Secondary | ICD-10-CM | POA: Diagnosis not present

## 2015-10-29 DIAGNOSIS — M1990 Unspecified osteoarthritis, unspecified site: Secondary | ICD-10-CM | POA: Diagnosis not present

## 2015-10-29 DIAGNOSIS — L97821 Non-pressure chronic ulcer of other part of left lower leg limited to breakdown of skin: Secondary | ICD-10-CM | POA: Diagnosis not present

## 2015-10-29 DIAGNOSIS — L97521 Non-pressure chronic ulcer of other part of left foot limited to breakdown of skin: Secondary | ICD-10-CM | POA: Diagnosis not present

## 2015-10-29 DIAGNOSIS — S81802D Unspecified open wound, left lower leg, subsequent encounter: Secondary | ICD-10-CM | POA: Diagnosis not present

## 2015-10-31 ENCOUNTER — Ambulatory Visit: Payer: Medicare Other | Admitting: Podiatry

## 2015-11-04 DIAGNOSIS — Z7982 Long term (current) use of aspirin: Secondary | ICD-10-CM

## 2015-11-04 DIAGNOSIS — I5030 Unspecified diastolic (congestive) heart failure: Secondary | ICD-10-CM

## 2015-11-04 DIAGNOSIS — Z4800 Encounter for change or removal of nonsurgical wound dressing: Secondary | ICD-10-CM

## 2015-11-04 DIAGNOSIS — I110 Hypertensive heart disease with heart failure: Secondary | ICD-10-CM

## 2015-11-04 DIAGNOSIS — I872 Venous insufficiency (chronic) (peripheral): Secondary | ICD-10-CM

## 2015-11-04 DIAGNOSIS — M4322 Fusion of spine, cervical region: Secondary | ICD-10-CM

## 2015-11-05 DIAGNOSIS — I10 Essential (primary) hypertension: Secondary | ICD-10-CM | POA: Diagnosis not present

## 2015-11-05 DIAGNOSIS — S81801D Unspecified open wound, right lower leg, subsequent encounter: Secondary | ICD-10-CM | POA: Diagnosis not present

## 2015-11-05 DIAGNOSIS — I890 Lymphedema, not elsewhere classified: Secondary | ICD-10-CM | POA: Diagnosis not present

## 2015-11-05 DIAGNOSIS — S81802D Unspecified open wound, left lower leg, subsequent encounter: Secondary | ICD-10-CM | POA: Diagnosis not present

## 2015-11-05 DIAGNOSIS — L97821 Non-pressure chronic ulcer of other part of left lower leg limited to breakdown of skin: Secondary | ICD-10-CM | POA: Diagnosis not present

## 2015-11-05 DIAGNOSIS — M1990 Unspecified osteoarthritis, unspecified site: Secondary | ICD-10-CM | POA: Diagnosis not present

## 2015-11-05 DIAGNOSIS — L97211 Non-pressure chronic ulcer of right calf limited to breakdown of skin: Secondary | ICD-10-CM | POA: Diagnosis not present

## 2015-11-05 DIAGNOSIS — L97511 Non-pressure chronic ulcer of other part of right foot limited to breakdown of skin: Secondary | ICD-10-CM | POA: Diagnosis not present

## 2015-11-05 DIAGNOSIS — L97521 Non-pressure chronic ulcer of other part of left foot limited to breakdown of skin: Secondary | ICD-10-CM | POA: Diagnosis not present

## 2015-11-07 DIAGNOSIS — M4322 Fusion of spine, cervical region: Secondary | ICD-10-CM | POA: Diagnosis not present

## 2015-11-07 DIAGNOSIS — I5030 Unspecified diastolic (congestive) heart failure: Secondary | ICD-10-CM | POA: Diagnosis not present

## 2015-11-07 DIAGNOSIS — Z4800 Encounter for change or removal of nonsurgical wound dressing: Secondary | ICD-10-CM | POA: Diagnosis not present

## 2015-11-07 DIAGNOSIS — Z7982 Long term (current) use of aspirin: Secondary | ICD-10-CM | POA: Diagnosis not present

## 2015-11-07 DIAGNOSIS — Z86718 Personal history of other venous thrombosis and embolism: Secondary | ICD-10-CM | POA: Diagnosis not present

## 2015-11-07 DIAGNOSIS — I872 Venous insufficiency (chronic) (peripheral): Secondary | ICD-10-CM | POA: Diagnosis not present

## 2015-11-07 DIAGNOSIS — Z79891 Long term (current) use of opiate analgesic: Secondary | ICD-10-CM | POA: Diagnosis not present

## 2015-11-07 DIAGNOSIS — I110 Hypertensive heart disease with heart failure: Secondary | ICD-10-CM | POA: Diagnosis not present

## 2015-11-07 DIAGNOSIS — Z8673 Personal history of transient ischemic attack (TIA), and cerebral infarction without residual deficits: Secondary | ICD-10-CM | POA: Diagnosis not present

## 2015-11-12 DIAGNOSIS — L97811 Non-pressure chronic ulcer of other part of right lower leg limited to breakdown of skin: Secondary | ICD-10-CM | POA: Diagnosis not present

## 2015-11-12 DIAGNOSIS — L97521 Non-pressure chronic ulcer of other part of left foot limited to breakdown of skin: Secondary | ICD-10-CM | POA: Diagnosis not present

## 2015-11-12 DIAGNOSIS — I890 Lymphedema, not elsewhere classified: Secondary | ICD-10-CM | POA: Diagnosis not present

## 2015-11-12 DIAGNOSIS — L97511 Non-pressure chronic ulcer of other part of right foot limited to breakdown of skin: Secondary | ICD-10-CM | POA: Diagnosis not present

## 2015-11-12 DIAGNOSIS — I10 Essential (primary) hypertension: Secondary | ICD-10-CM | POA: Diagnosis not present

## 2015-11-12 DIAGNOSIS — L97821 Non-pressure chronic ulcer of other part of left lower leg limited to breakdown of skin: Secondary | ICD-10-CM | POA: Diagnosis not present

## 2015-11-12 DIAGNOSIS — L97211 Non-pressure chronic ulcer of right calf limited to breakdown of skin: Secondary | ICD-10-CM | POA: Diagnosis not present

## 2015-11-12 DIAGNOSIS — M1990 Unspecified osteoarthritis, unspecified site: Secondary | ICD-10-CM | POA: Diagnosis not present

## 2015-11-14 DIAGNOSIS — H6123 Impacted cerumen, bilateral: Secondary | ICD-10-CM | POA: Diagnosis not present

## 2015-11-17 DIAGNOSIS — I5030 Unspecified diastolic (congestive) heart failure: Secondary | ICD-10-CM | POA: Diagnosis not present

## 2015-11-17 DIAGNOSIS — Z8673 Personal history of transient ischemic attack (TIA), and cerebral infarction without residual deficits: Secondary | ICD-10-CM | POA: Diagnosis not present

## 2015-11-17 DIAGNOSIS — Z79891 Long term (current) use of opiate analgesic: Secondary | ICD-10-CM | POA: Diagnosis not present

## 2015-11-17 DIAGNOSIS — Z7982 Long term (current) use of aspirin: Secondary | ICD-10-CM | POA: Diagnosis not present

## 2015-11-17 DIAGNOSIS — I872 Venous insufficiency (chronic) (peripheral): Secondary | ICD-10-CM | POA: Diagnosis not present

## 2015-11-17 DIAGNOSIS — M4322 Fusion of spine, cervical region: Secondary | ICD-10-CM | POA: Diagnosis not present

## 2015-11-17 DIAGNOSIS — I110 Hypertensive heart disease with heart failure: Secondary | ICD-10-CM | POA: Diagnosis not present

## 2015-11-17 DIAGNOSIS — Z4800 Encounter for change or removal of nonsurgical wound dressing: Secondary | ICD-10-CM | POA: Diagnosis not present

## 2015-11-17 DIAGNOSIS — Z86718 Personal history of other venous thrombosis and embolism: Secondary | ICD-10-CM | POA: Diagnosis not present

## 2015-11-19 DIAGNOSIS — I890 Lymphedema, not elsewhere classified: Secondary | ICD-10-CM | POA: Diagnosis not present

## 2015-11-19 DIAGNOSIS — L97821 Non-pressure chronic ulcer of other part of left lower leg limited to breakdown of skin: Secondary | ICD-10-CM | POA: Diagnosis not present

## 2015-11-19 DIAGNOSIS — L97211 Non-pressure chronic ulcer of right calf limited to breakdown of skin: Secondary | ICD-10-CM | POA: Diagnosis not present

## 2015-11-19 DIAGNOSIS — I10 Essential (primary) hypertension: Secondary | ICD-10-CM | POA: Diagnosis not present

## 2015-11-19 DIAGNOSIS — M1990 Unspecified osteoarthritis, unspecified site: Secondary | ICD-10-CM | POA: Diagnosis not present

## 2015-11-19 DIAGNOSIS — L97521 Non-pressure chronic ulcer of other part of left foot limited to breakdown of skin: Secondary | ICD-10-CM | POA: Diagnosis not present

## 2015-11-19 DIAGNOSIS — L97511 Non-pressure chronic ulcer of other part of right foot limited to breakdown of skin: Secondary | ICD-10-CM | POA: Diagnosis not present

## 2015-11-26 ENCOUNTER — Encounter (HOSPITAL_BASED_OUTPATIENT_CLINIC_OR_DEPARTMENT_OTHER): Payer: Medicare Other | Attending: Surgery

## 2015-11-26 DIAGNOSIS — Z8546 Personal history of malignant neoplasm of prostate: Secondary | ICD-10-CM | POA: Diagnosis not present

## 2015-11-26 DIAGNOSIS — M109 Gout, unspecified: Secondary | ICD-10-CM | POA: Insufficient documentation

## 2015-11-26 DIAGNOSIS — Z8673 Personal history of transient ischemic attack (TIA), and cerebral infarction without residual deficits: Secondary | ICD-10-CM | POA: Insufficient documentation

## 2015-11-26 DIAGNOSIS — I739 Peripheral vascular disease, unspecified: Secondary | ICD-10-CM | POA: Diagnosis not present

## 2015-11-26 DIAGNOSIS — I10 Essential (primary) hypertension: Secondary | ICD-10-CM | POA: Insufficient documentation

## 2015-11-26 DIAGNOSIS — Z872 Personal history of diseases of the skin and subcutaneous tissue: Secondary | ICD-10-CM | POA: Insufficient documentation

## 2015-11-26 DIAGNOSIS — Z09 Encounter for follow-up examination after completed treatment for conditions other than malignant neoplasm: Secondary | ICD-10-CM | POA: Insufficient documentation

## 2015-11-26 DIAGNOSIS — I890 Lymphedema, not elsewhere classified: Secondary | ICD-10-CM | POA: Insufficient documentation

## 2015-11-26 DIAGNOSIS — S81801D Unspecified open wound, right lower leg, subsequent encounter: Secondary | ICD-10-CM | POA: Diagnosis not present

## 2015-12-04 ENCOUNTER — Telehealth: Payer: Self-pay | Admitting: *Deleted

## 2015-12-04 NOTE — Telephone Encounter (Signed)
OK to fill this prescription with additional refills x0 Thank you!  

## 2015-12-04 NOTE — Telephone Encounter (Signed)
Rec'd call pt requesting refill on Hydrocodone...Johny Chess

## 2015-12-05 ENCOUNTER — Other Ambulatory Visit: Payer: Self-pay | Admitting: *Deleted

## 2015-12-05 MED ORDER — HYDROCODONE-ACETAMINOPHEN 7.5-325 MG PO TABS: 1.0000 | ORAL_TABLET | Freq: Four times a day (QID) | ORAL | 0 refills | Status: DC | PRN

## 2015-12-05 NOTE — Telephone Encounter (Signed)
Called pt to verify which dosage of the hydrocodone he is taking. Wife states MD gave him 7.5mg /325 mg, but he haven't had to have it refill. Inform wife MD did ok refill will print and have MD to sign and she can pick rx up this afternoon....Kurt Sexton

## 2015-12-09 ENCOUNTER — Ambulatory Visit (INDEPENDENT_AMBULATORY_CARE_PROVIDER_SITE_OTHER): Payer: Medicare Other

## 2015-12-09 DIAGNOSIS — Z23 Encounter for immunization: Secondary | ICD-10-CM | POA: Diagnosis not present

## 2015-12-20 ENCOUNTER — Other Ambulatory Visit: Payer: Self-pay | Admitting: Internal Medicine

## 2015-12-22 ENCOUNTER — Other Ambulatory Visit (INDEPENDENT_AMBULATORY_CARE_PROVIDER_SITE_OTHER): Payer: Medicare Other

## 2015-12-22 ENCOUNTER — Encounter: Payer: Self-pay | Admitting: Internal Medicine

## 2015-12-22 ENCOUNTER — Ambulatory Visit (INDEPENDENT_AMBULATORY_CARE_PROVIDER_SITE_OTHER): Payer: Medicare Other | Admitting: Internal Medicine

## 2015-12-22 VITALS — BP 136/72 | HR 67 | Wt 227.0 lb

## 2015-12-22 DIAGNOSIS — C61 Malignant neoplasm of prostate: Secondary | ICD-10-CM

## 2015-12-22 DIAGNOSIS — I10 Essential (primary) hypertension: Secondary | ICD-10-CM

## 2015-12-22 DIAGNOSIS — I674 Hypertensive encephalopathy: Secondary | ICD-10-CM

## 2015-12-22 DIAGNOSIS — L97309 Non-pressure chronic ulcer of unspecified ankle with unspecified severity: Secondary | ICD-10-CM

## 2015-12-22 DIAGNOSIS — I872 Venous insufficiency (chronic) (peripheral): Secondary | ICD-10-CM | POA: Diagnosis not present

## 2015-12-22 DIAGNOSIS — R0609 Other forms of dyspnea: Secondary | ICD-10-CM

## 2015-12-22 DIAGNOSIS — Z8546 Personal history of malignant neoplasm of prostate: Secondary | ICD-10-CM

## 2015-12-22 LAB — BASIC METABOLIC PANEL
BUN: 20 mg/dL (ref 6–23)
CHLORIDE: 105 meq/L (ref 96–112)
CO2: 29 meq/L (ref 19–32)
CREATININE: 1.28 mg/dL (ref 0.40–1.50)
Calcium: 8.6 mg/dL (ref 8.4–10.5)
GFR: 70.21 mL/min (ref >60.00)
Glucose, Bld: 86 mg/dL (ref 70–99)
POTASSIUM: 4.5 meq/L (ref 3.5–5.1)
Sodium: 139 mEq/L (ref 135–145)

## 2015-12-22 LAB — CBC WITH DIFFERENTIAL/PLATELET
BASOS PCT: 1 % (ref 0.0–3.0)
Basophils Absolute: 0.1 10*3/uL (ref 0.0–0.1)
Eosinophils Absolute: 1.8 10*3/uL — ABNORMAL HIGH (ref 0.0–0.7)
HEMATOCRIT: 32.2 % — AB (ref 39.0–52.0)
HEMOGLOBIN: 10.8 g/dL — AB (ref 13.0–17.0)
LYMPHS PCT: 16.9 % (ref 12.0–46.0)
Lymphs Abs: 1.3 10*3/uL (ref 0.7–4.0)
MCHC: 33.4 g/dL (ref 30.0–36.0)
MCV: 82.4 fl (ref 78.0–100.0)
Monocytes Absolute: 0.9 10*3/uL (ref 0.1–1.0)
Monocytes Relative: 11.1 % (ref 3.0–12.0)
NEUTROS ABS: 3.8 10*3/uL (ref 1.4–7.7)
Neutrophils Relative %: 48 % (ref 43.0–77.0)
PLATELETS: 253 10*3/uL (ref 150.0–400.0)
RBC: 3.91 Mil/uL — ABNORMAL LOW (ref 4.22–5.81)
RDW: 13.8 % (ref 11.5–15.5)
WBC: 7.9 10*3/uL (ref 4.0–10.5)

## 2015-12-22 LAB — BRAIN NATRIURETIC PEPTIDE: Pro B Natriuretic peptide (BNP): 51 pg/mL (ref 0.0–100.0)

## 2015-12-22 LAB — TSH: TSH: 2.02 u[IU]/mL (ref 0.35–4.50)

## 2015-12-22 MED ORDER — TRIAMCINOLONE ACETONIDE 0.1 % EX OINT: 1.0000 "application " | TOPICAL_OINTMENT | Freq: Two times a day (BID) | CUTANEOUS | 2 refills | Status: DC | PRN

## 2015-12-22 NOTE — Assessment & Plan Note (Addendum)
BP is better Labetalol, Torsemide

## 2015-12-22 NOTE — Assessment & Plan Note (Signed)
Labetalol, Torsemide

## 2015-12-22 NOTE — Assessment & Plan Note (Signed)
BNP

## 2015-12-22 NOTE — Assessment & Plan Note (Signed)
Home wound care Will ref to Wound Clinic Treat edema

## 2015-12-22 NOTE — Progress Notes (Signed)
Pre visit review using our clinic review tool, if applicable. No additional management support is needed unless otherwise documented below in the visit note. 

## 2015-12-22 NOTE — Assessment & Plan Note (Signed)
Monitor PSA 

## 2015-12-22 NOTE — Progress Notes (Signed)
Subjective:  Patient ID: Kurt Sexton, male    DOB: Jul 25, 1940  Age: 75 y.o. MRN: [id number]  CC: No chief complaint on file.   HPI Kurt Sexton presents for B leg swelling, leg wounds, rash, CHF f/u. Leg wounds are worse...  Outpatient Medications Prior to Visit  Medication Sig Dispense Refill  . aspirin 81 MG EC tablet Take 81 mg by mouth daily.      . betamethasone dipropionate (DIPROLENE) 0.05 % cream Apply 1 application topically 2 (two) times daily. Reported on 05/27/2015    . Cholecalciferol (VITAMIN D3) 2000 units capsule Take 1 capsule (2,000 Units total) by mouth daily. 100 capsule 3  . ergocalciferol (VITAMIN D2) 50000 UNITS capsule Take 1 capsule (50,000 Units total) by mouth once a week. 6 capsule 0  . ferrous sulfate 325 (65 FE) MG tablet Take 1 tablet (325 mg total) by mouth 2 (two) times daily with a meal. 100 tablet 3  . HYDROcodone-acetaminophen (NORCO) 7.5-325 MG tablet Take 1 tablet by mouth 4 (four) times daily as needed for moderate pain. 60 tablet 0  . hydrOXYzine (ATARAX/VISTARIL) 25 MG tablet Take 1 tablet (25 mg total) by mouth every 8 (eight) hours as needed for itching or nausea. 60 tablet 1  . labetalol (NORMODYNE) 200 MG tablet TAKE 1 TABLET BY MOUTH 3 TIMES DAILY 270 tablet 2  . Magnesium Hydroxide (MILK OF MAGNESIA PO) Take by mouth. If no BM in 3 days give 30 cc milk of magnesium p.o. 1 dose in 24 hours as needed    . meclizine (ANTIVERT) 12.5 MG tablet Take 12.5 mg by mouth every 6 (six) hours as needed for dizziness.    Marland Kitchen tiZANidine (ZANAFLEX) 2 MG tablet Take 1 tablet (2 mg total) by mouth at bedtime as needed for muscle spasms. 30 tablet 5  . torsemide (DEMADEX) 20 MG tablet Take 1 tablet (20 mg total) by mouth daily. 30 tablet 2  . traMADol-acetaminophen (ULTRACET) 37.5-325 MG tablet Take 1 tablet by mouth every 8 (eight) hours as needed for severe pain. 60 tablet 2  . triamcinolone ointment (KENALOG) 0.1 % Apply 1 application topically 2 (two) times  daily as needed (skin irritation). 80 g 2   No facility-administered medications prior to visit.     ROS Review of Systems  Constitutional: Positive for fatigue. Negative for appetite change and unexpected weight change.  HENT: Negative for congestion, nosebleeds, sneezing, sore throat and trouble swallowing.   Eyes: Negative for itching and visual disturbance.  Respiratory: Negative for cough.   Cardiovascular: Positive for leg swelling. Negative for chest pain and palpitations.  Gastrointestinal: Negative for abdominal distention, blood in stool, diarrhea and nausea.  Genitourinary: Negative for frequency and hematuria.  Musculoskeletal: Positive for gait problem. Negative for back pain, joint swelling and neck pain.  Skin: Positive for color change, rash and wound.  Neurological: Positive for weakness. Negative for dizziness, tremors and speech difficulty.  Psychiatric/Behavioral: Negative for agitation, dysphoric mood and sleep disturbance. The patient is not nervous/anxious.     Objective:  BP 136/72   Pulse 67   Wt 227 lb (103 kg)   SpO2 98%   BMI 27.63 kg/m   BP Readings from Last 3 Encounters:  12/22/15 136/72  10/16/15 (!) 148/80  09/19/15 (!) 150/70    Wt Readings from Last 3 Encounters:  12/22/15 227 lb (103 kg)  10/16/15 220 lb 4 oz (99.9 kg)  09/19/15 226 lb (102.5 kg)    Physical Exam  Constitutional: He is oriented to person, place, and time. He appears well-developed. No distress.  NAD  HENT:  Mouth/Throat: Oropharynx is clear and moist.  Eyes: Conjunctivae are normal. Pupils are equal, round, and reactive to light.  Neck: Normal range of motion. No JVD present. No thyromegaly present.  Cardiovascular: Normal rate, regular rhythm, normal heart sounds and intact distal pulses.  Exam reveals no gallop and no friction rub.   No murmur heard. Pulmonary/Chest: Effort normal and breath sounds normal. No respiratory distress. He has no wheezes. He has no  rales. He exhibits no tenderness.  Abdominal: Soft. Bowel sounds are normal. He exhibits no distension and no mass. There is no tenderness. There is no rebound and no guarding.  Musculoskeletal: Normal range of motion. He exhibits edema and tenderness.  Lymphadenopathy:    He has no cervical adenopathy.  Neurological: He is alert and oriented to person, place, and time. He has normal reflexes. No cranial nerve deficit. He exhibits normal muscle tone. He displays a negative Romberg sign. Coordination abnormal. Gait normal.  Skin: Skin is warm and dry. Rash noted. No erythema.  Psychiatric: He has a normal mood and affect. His behavior is normal. Judgment and thought content normal.  Edema 1+ B LEs dressed Hands w/eczema  Lab Results  Component Value Date   WBC 5.8 06/25/2015   HGB 11.1 (L) 06/25/2015   HCT 35.0 (L) 06/25/2015   PLT 287.0 06/25/2015   GLUCOSE 110 (H) 06/25/2015   CHOL 198 06/25/2015   TRIG 121.0 06/25/2015   HDL 39.10 06/25/2015   LDLCALC 135 (H) 06/25/2015   ALT 14 06/25/2015   AST 15 06/25/2015   NA 141 06/25/2015   K 4.5 06/25/2015   CL 104 06/25/2015   CREATININE 1.11 06/25/2015   BUN 17 06/25/2015   CO2 32 06/25/2015   TSH 2.49 06/25/2015   PSA 10.85 (H) 06/25/2015   INR 1.01 10/25/2010   HGBA1C 5.2 06/25/2015    Dg Pelvis 1-2 Views  Result Date: 08/07/2015 CLINICAL DATA:  Fall 5 days ago.  Pelvis pain. EXAM: PELVIS - 1-2 VIEW COMPARISON:  None. FINDINGS: Both hips appear located. There is no evidence for acute fracture or subluxation. Mild bilateral hip osteoarthritis noted. Degenerative disc disease noted within the lumbar spine. IMPRESSION: 1. No acute findings identified. 2. Degenerative change as above. Electronically Signed   By: Kerby Moors M.D.   On: 08/07/2015 15:34   Ct Head Wo Contrast  Result Date: 08/07/2015 CLINICAL DATA:  Status post fall 5 days ago. Persistent right neck and right hand pain. Initial encounter. EXAM: CT HEAD WITHOUT  CONTRAST CT CERVICAL SPINE WITHOUT CONTRAST TECHNIQUE: Multidetector CT imaging of the head and cervical spine was performed following the standard protocol without intravenous contrast. Multiplanar CT image reconstructions of the cervical spine were also generated. COMPARISON:  Head and cervical spine CT scans 10/24/2014. FINDINGS: CT HEAD FINDINGS There is some cortical atrophy and chronic microvascular ischemic change as seen on the prior exam. No evidence of acute intracranial abnormality including hemorrhage, infarct, mass lesion, mass effect, midline shift or abnormal extra-axial fluid collection is identified. The calvarium is intact. Imaged paranasal sinuses and mastoid air cells are clear. CT CERVICAL SPINE FINDINGS Since the prior CT scan, the patient has undergone C6 corpectomy and C5-7 anterior fusion. No fracture or malalignment is identified. Lung apices are clear. IMPRESSION: No acute abnormality head or cervical spine. Atrophy and chronic microvascular ischemic change. Status post C6-5 7 anterior fusion in C6  corpectomy. Electronically Signed   By: Inge Rise M.D.   On: 08/07/2015 14:39   Ct Cervical Spine Wo Contrast  Result Date: 08/07/2015 CLINICAL DATA:  Status post fall 5 days ago. Persistent right neck and right hand pain. Initial encounter. EXAM: CT HEAD WITHOUT CONTRAST CT CERVICAL SPINE WITHOUT CONTRAST TECHNIQUE: Multidetector CT imaging of the head and cervical spine was performed following the standard protocol without intravenous contrast. Multiplanar CT image reconstructions of the cervical spine were also generated. COMPARISON:  Head and cervical spine CT scans 10/24/2014. FINDINGS: CT HEAD FINDINGS There is some cortical atrophy and chronic microvascular ischemic change as seen on the prior exam. No evidence of acute intracranial abnormality including hemorrhage, infarct, mass lesion, mass effect, midline shift or abnormal extra-axial fluid collection is identified. The  calvarium is intact. Imaged paranasal sinuses and mastoid air cells are clear. CT CERVICAL SPINE FINDINGS Since the prior CT scan, the patient has undergone C6 corpectomy and C5-7 anterior fusion. No fracture or malalignment is identified. Lung apices are clear. IMPRESSION: No acute abnormality head or cervical spine. Atrophy and chronic microvascular ischemic change. Status post C6-5 7 anterior fusion in C6 corpectomy. Electronically Signed   By: Inge Rise M.D.   On: 08/07/2015 14:39    Assessment & Plan:   There are no diagnoses linked to this encounter. I am having Mr. Torosyan maintain his aspirin, hydrOXYzine, betamethasone dipropionate, meclizine, Magnesium Hydroxide (MILK OF MAGNESIA PO), ergocalciferol, ferrous sulfate, Vitamin D3, torsemide, tiZANidine, triamcinolone ointment, traMADol-acetaminophen, HYDROcodone-acetaminophen, and labetalol.  No orders of the defined types were placed in this encounter.    Follow-up: No Follow-up on file.  [name], MD

## 2015-12-22 NOTE — Assessment & Plan Note (Signed)
PSA

## 2015-12-30 ENCOUNTER — Ambulatory Visit (INDEPENDENT_AMBULATORY_CARE_PROVIDER_SITE_OTHER)
Admission: EM | Admit: 2015-12-30 | Discharge: 2015-12-30 | Disposition: A | Discharge status: Other Acute Inpatient Hospital | Payer: Medicare Other | Source: Home / Self Care | Attending: Emergency Medicine | Admitting: Emergency Medicine

## 2015-12-30 ENCOUNTER — Emergency Department (HOSPITAL_COMMUNITY): Payer: Medicare Other

## 2015-12-30 ENCOUNTER — Emergency Department (HOSPITAL_COMMUNITY)
Admission: EM | Admit: 2015-12-30 | Discharge: 2015-12-31 | Disposition: A | Discharge status: Home / Self Care | Payer: Medicare Other | Attending: Emergency Medicine | Admitting: Emergency Medicine

## 2015-12-30 ENCOUNTER — Encounter (HOSPITAL_COMMUNITY): Payer: Self-pay | Admitting: *Deleted

## 2015-12-30 ENCOUNTER — Encounter (HOSPITAL_COMMUNITY): Payer: Self-pay | Admitting: Emergency Medicine

## 2015-12-30 DIAGNOSIS — Y999 Unspecified external cause status: Secondary | ICD-10-CM | POA: Insufficient documentation

## 2015-12-30 DIAGNOSIS — S0990XA Unspecified injury of head, initial encounter: Secondary | ICD-10-CM

## 2015-12-30 DIAGNOSIS — R51 Headache: Secondary | ICD-10-CM | POA: Diagnosis not present

## 2015-12-30 DIAGNOSIS — Z8673 Personal history of transient ischemic attack (TIA), and cerebral infarction without residual deficits: Secondary | ICD-10-CM | POA: Diagnosis not present

## 2015-12-30 DIAGNOSIS — R0789 Other chest pain: Secondary | ICD-10-CM | POA: Diagnosis not present

## 2015-12-30 DIAGNOSIS — R42 Dizziness and giddiness: Secondary | ICD-10-CM

## 2015-12-30 DIAGNOSIS — Z7982 Long term (current) use of aspirin: Secondary | ICD-10-CM | POA: Insufficient documentation

## 2015-12-30 DIAGNOSIS — Y939 Activity, unspecified: Secondary | ICD-10-CM | POA: Diagnosis not present

## 2015-12-30 DIAGNOSIS — R55 Syncope and collapse: Secondary | ICD-10-CM | POA: Insufficient documentation

## 2015-12-30 DIAGNOSIS — W1839XA Other fall on same level, initial encounter: Secondary | ICD-10-CM | POA: Insufficient documentation

## 2015-12-30 DIAGNOSIS — Y92002 Bathroom of unspecified non-institutional (private) residence single-family (private) house as the place of occurrence of the external cause: Secondary | ICD-10-CM | POA: Insufficient documentation

## 2015-12-30 DIAGNOSIS — Z8546 Personal history of malignant neoplasm of prostate: Secondary | ICD-10-CM | POA: Diagnosis not present

## 2015-12-30 DIAGNOSIS — I509 Heart failure, unspecified: Secondary | ICD-10-CM | POA: Diagnosis not present

## 2015-12-30 DIAGNOSIS — R519 Headache, unspecified: Secondary | ICD-10-CM

## 2015-12-30 DIAGNOSIS — S2232XA Fracture of one rib, left side, initial encounter for closed fracture: Secondary | ICD-10-CM | POA: Diagnosis not present

## 2015-12-30 DIAGNOSIS — Z79899 Other long term (current) drug therapy: Secondary | ICD-10-CM | POA: Diagnosis not present

## 2015-12-30 DIAGNOSIS — I110 Hypertensive heart disease with heart failure: Secondary | ICD-10-CM | POA: Insufficient documentation

## 2015-12-30 LAB — COMPREHENSIVE METABOLIC PANEL
ALK PHOS: 116 U/L (ref 38–126)
ALT: 23 U/L (ref 17–63)
AST: 24 U/L (ref 15–41)
Albumin: 3.2 g/dL — ABNORMAL LOW (ref 3.5–5.0)
Anion gap: 7 (ref 5–15)
BILIRUBIN TOTAL: 0.8 mg/dL (ref 0.3–1.2)
BUN: 17 mg/dL (ref 6–20)
CO2: 25 mmol/L (ref 22–32)
CREATININE: 1.21 mg/dL (ref 0.61–1.24)
Calcium: 9.1 mg/dL (ref 8.9–10.3)
Chloride: 112 mmol/L — ABNORMAL HIGH (ref 101–111)
GFR calc Af Amer: >60 mL/min (ref >60)
GFR, EST NON AFRICAN AMERICAN: 56 mL/min — AB (ref >60)
Glucose, Bld: 106 mg/dL — ABNORMAL HIGH (ref 65–99)
Potassium: 5.1 mmol/L (ref 3.5–5.1)
Sodium: 144 mmol/L (ref 135–145)
TOTAL PROTEIN: 6.8 g/dL (ref 6.5–8.1)

## 2015-12-30 LAB — CBC
HCT: 35.1 % — ABNORMAL LOW (ref 39.0–52.0)
Hemoglobin: 11.1 g/dL — ABNORMAL LOW (ref 13.0–17.0)
MCH: 26.8 pg (ref 26.0–34.0)
MCHC: 31.6 g/dL (ref 30.0–36.0)
MCV: 84.8 fL (ref 78.0–100.0)
PLATELETS: 308 10*3/uL (ref 150–400)
RBC: 4.14 MIL/uL — ABNORMAL LOW (ref 4.22–5.81)
RDW: 13.1 % (ref 11.5–15.5)
WBC: 8.7 10*3/uL (ref 4.0–10.5)

## 2015-12-30 MED ORDER — OXYCODONE HCL 5 MG PO TABS
5.0000 mg | ORAL_TABLET | Freq: Once | ORAL | Status: AC
  Administered 2015-12-30: 5 mg via ORAL
  Filled 2015-12-30: qty 1

## 2015-12-30 MED ORDER — SODIUM CHLORIDE 0.9 % IV BOLUS (SEPSIS)
1000.0000 mL | Freq: Once | INTRAVENOUS | Status: AC
  Administered 2015-12-30: 1000 mL via INTRAVENOUS

## 2015-12-30 MED ORDER — ACETAMINOPHEN 500 MG PO TABS
1000.0000 mg | ORAL_TABLET | Freq: Once | ORAL | Status: AC
  Administered 2015-12-30: 1000 mg via ORAL
  Filled 2015-12-30: qty 2

## 2015-12-30 NOTE — Discharge Instructions (Signed)
Drink plenty of fluids, follow up with your family doc.
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
## 2015-12-30 NOTE — ED Notes (Signed)
Pt reports syncopal episode last Thursday in the bathroom at home where he lost consciousness, fell, and injured LEFT ribs

## 2015-12-30 NOTE — ED Provider Notes (Signed)
HPI  SUBJECTIVE:  Kurt Sexton is a 75 y.o. male who presents with 45 minute episode of dizziness described as lightheadedness and feeling "as if he was about to pass out" while walking earlier today. Patient states that he felt discoordinated. He denies vertigo. He had a syncopal episode after urinating 5 days ago, and hit his head and his anterior chest on the furniture in the bathroom. He was on the floor for an unknown period of time. . There are no aggravating factors. Symptoms are better with sitting and being still. He denies chest pain, shortness of breath, palpitations accompanying the dizziness. No arm or leg weakness, visual changes, dysarthria. He also complains of constant, sharp, left lower anterior rib pain which is worse with movement, palpation, deep inspiration and better with staying still. He has not tried anything for this. He denies shortness of breath or hemoptysis, bruising.  Of note, denies chest pain, shortness of breath, palpitations, tunnel vision, tinnitus, nausea, diarrhea recess, abdominal pain, back pain prior to his syncopal episode 5 days ago. This has never happened before. His only new medications are Norco which he is taking for his pain in his feet secondary to venous stasis. He states that he has not taken any recently. He denies any other change in his medications. He had an episode of syncope in January 2017 which was eventually attributed to him increasing his labetalol on his own. No cardiac cause was found. He also has a history of stroke, hypertension, spinal stenosis, lower extremity edema/venous stasis. No history of diabetes, atrial fibrillation, arrhythmia, MI, PE, DVT. He takes 81 mg aspirin daily. No other anticoagulant or antiplatelets. No history of carotid stenosis. PMD: Dr. Alain Marion    Past Medical History:  Diagnosis Date  . BPH (benign prostatic hyperplasia)    Dr [name]  . CHF (congestive heart failure) ([place])   . CVA (cerebral vascular  accident) ([place]) 1998   denies residual on 04/17/2015  . High cholesterol   . History of gout   . HTN (hypertension)   . LBP (low back pain) 2010  . Osteoarthritis    "right knee; right hand" (04/17/2015)  . Prostate cancer ([place]) 01/2008   gleason 3+4=7, PSA 4.93, vol 34.18 cc  . Venous insufficiency   . Vitamin D deficiency     Past Surgical History:  Procedure Laterality Date  . ANTERIOR CERVICAL DECOMP/DISCECTOMY FUSION N/A 11/22/2014   Procedure: Cervical Six Corpectomy with graft and plating Cervical Five-Seven;  Surgeon: Erline Levine, MD;  Location: [facility];  Service: Neurosurgery;  Laterality: N/A;  . BACK SURGERY    . CORRECTION HAMMER TOE Left   . INGUINAL HERNIA REPAIR Bilateral   . KNEE ARTHROSCOPY W/ PCL AND LCL  REPAIR & TENDON GRAFT Left   . PROSTATE BIOPSY  01/2008   gleason 7  . PROSTATE BIOPSY  03/27/13   gleason 3+4=7, volume 34.18 cc  . TONSILLECTOMY      Family History  Problem Relation Age of Onset  . Cancer Brother     prostate, tx w/xrt  . Diabetes Other     1st degree relative  . Hypertension Other     Social History  Substance Use Topics  . Smoking status: Never Smoker  . Smokeless tobacco: Never Used  . Alcohol use No    No current facility-administered medications for this encounter.   Current Outpatient Prescriptions:  .  aspirin 81 MG EC tablet, Take 81 mg by mouth daily.  , Disp: ,  Rfl:  .  betamethasone dipropionate (DIPROLENE) 0.05 % cream, Apply 1 application topically 2 (two) times daily. Reported on 05/27/2015, Disp: , Rfl:  .  Cholecalciferol (VITAMIN D3) 2000 units capsule, Take 1 capsule (2,000 Units total) by mouth daily., Disp: 100 capsule, Rfl: 3 .  ergocalciferol (VITAMIN D2) 50000 UNITS capsule, Take 1 capsule (50,000 Units total) by mouth once a week., Disp: 6 capsule, Rfl: 0 .  ferrous sulfate 325 (65 FE) MG tablet, Take 1 tablet (325 mg total) by mouth 2 (two) times daily with a meal., Disp: 100 tablet, Rfl: 3 .   HYDROcodone-acetaminophen (NORCO) 7.5-325 MG tablet, Take 1 tablet by mouth 4 (four) times daily as needed for moderate pain., Disp: 60 tablet, Rfl: 0 .  hydrOXYzine (ATARAX/VISTARIL) 25 MG tablet, Take 1 tablet (25 mg total) by mouth every 8 (eight) hours as needed for itching or nausea., Disp: 60 tablet, Rfl: 1 .  labetalol (NORMODYNE) 200 MG tablet, TAKE 1 TABLET BY MOUTH 3 TIMES DAILY, Disp: 270 tablet, Rfl: 2 .  Magnesium Hydroxide (MILK OF MAGNESIA PO), Take by mouth. If no BM in 3 days give 30 cc milk of magnesium p.o. 1 dose in 24 hours as needed, Disp: , Rfl:  .  meclizine (ANTIVERT) 12.5 MG tablet, Take 12.5 mg by mouth every 6 (six) hours as needed for dizziness., Disp: , Rfl:  .  tiZANidine (ZANAFLEX) 2 MG tablet, Take 1 tablet (2 mg total) by mouth at bedtime as needed for muscle spasms., Disp: 30 tablet, Rfl: 5 .  torsemide (DEMADEX) 20 MG tablet, Take 1 tablet (20 mg total) by mouth daily., Disp: 30 tablet, Rfl: 2 .  traMADol-acetaminophen (ULTRACET) 37.5-325 MG tablet, Take 1 tablet by mouth every 8 (eight) hours as needed for severe pain., Disp: 60 tablet, Rfl: 2 .  triamcinolone ointment (KENALOG) 0.1 %, Apply 1 application topically 2 (two) times daily as needed (skin irritation)., Disp: 80 g, Rfl: 2  Allergies  Allergen Reactions  . Diazepam Other (See Comments)    lethargic  . Amlodipine Besylate Other (See Comments)    dizzy  . Cozaar     Severe headache  . Doxazosin Mesylate Other (See Comments)    dizzy  . Lisinopril Other (See Comments)    headache  . Ramipril Other (See Comments)    headache  . Spironolactone Other (See Comments)    High potassium  . Bactrim [Sulfamethoxazole-Trimethoprim] Rash     ROS  As noted in HPI.   Physical Exam  BP 148/86 (BP Location: Left Arm)   Pulse 72   Temp 98.6 F (37 C) (Oral)   Resp 18   SpO2 98%   Constitutional: Well developed, well nourished, no acute distress Eyes: PERRL, EOMI, conjunctiva normal  bilaterally HENT: Normocephalic, atraumatic,mucus membranes moist and positive scabbing left mastoid process, tenderness. No hemotympanum. Tenderness over the right forehead. No bruising, swelling. No other evidence of skull fracture. No raccoon eyes. Neck: No C-spine tenderness. No carotid bruit Respiratory: Clear to auscultation bilaterally, no rales, no wheezing, no rhonchi. Positive point tenderness left anterior lower ribs, no bruising, crepitus. Cardiovascular: Normal rate and rhythm, no murmurs, no gallops, no rubs GI: Soft, nondistended, normal bowel sounds, nontender, no rebound, no guarding skin: No rash, skin intact Musculoskeletal: No edema, no tenderness, no deformities Neurologic: Alert & oriented x 3, CN II-XII intact, no motor deficits, sensation grossly intact, coordination grossly normal. Psychiatric: Speech and behavior appropriate   ED Course   Medications - No data  to display  No orders of the defined types were placed in this encounter.  No results found for this or any previous visit (from the past 24 hour(s)). No results found.  ED Clinical Impression  Syncope, unspecified syncope type  Acute nonintractable headache, unspecified headache type  Dizziness  Closed fracture of one rib of left side, initial encounter  Injury of head, initial encounter  ED Assessment/Plan  Concern for worsening headache and 45 minute episode of feeling lightheaded/dizzy with exertion today post head injury/ syncopal episode. Concern for subdural hematoma. Patient may have had an episode of micturition syncope, however, concern for serious cause of his syncope given that he had no warning prior to syncopizing, and he was also on the floor for an unknown amount of time. Transferring to the ED for comprehensive workup and imaging.  Clinically, patient has at least one rib fracture given point tenderness anterior lower rib cage. X-ray was not done here as patient was being sent to  the ED. Is in no respiratory distress. Lungs clear, good air movement.  Vitals are normal,she is completely neurologically intact, feel the patient is stable to go via shuttle.  Discussed  MDM, plan for transfer with patient and family. Patient and family agrees with plan.   No orders of the defined types were placed in this encounter.   *This clinic note was created using Dragon dictation software. Therefore, there may be occasional mistakes despite careful proofreading.  ?   Melynda Ripple, MD 12/30/15 1616

## 2015-12-30 NOTE — ED Notes (Signed)
Report   Phoned  To  Otila Kluver      -    Nurse   First

## 2015-12-30 NOTE — ED Triage Notes (Signed)
Pt  Reports   While  He  Was  Urinating  5  Days  Ago  He  Inez  In  [facility]  He  Has  Pain  In  l  Rib  Cage  Area      On palpation   And   On  Movement       He  Also reports   Headache  And  Pain  Behind  l  Ear  He  Phoned  His   pcp    And  Was  Told  To    Come  To the  ucc he  Is   Awake  And  Alert

## 2015-12-30 NOTE — ED Triage Notes (Signed)
Pt here from [facility] for CT scan; pt fell last Thursday with possibly syncope; pt sts broken left rib; pt sts did hit his head

## 2015-12-30 NOTE — ED Notes (Signed)
Fall  Risk    Band  Applied  To     Pt

## 2015-12-31 MED ORDER — OXYCODONE HCL 5 MG PO TABS: 5.0000 mg | ORAL_TABLET | ORAL | 0 refills | Status: DC | PRN

## 2016-01-06 ENCOUNTER — Ambulatory Visit (INDEPENDENT_AMBULATORY_CARE_PROVIDER_SITE_OTHER): Payer: Medicare Other | Admitting: Nurse Practitioner

## 2016-01-06 ENCOUNTER — Encounter: Payer: Self-pay | Admitting: Nurse Practitioner

## 2016-01-06 VITALS — BP 130/60 | HR 69 | Wt 225.0 lb

## 2016-01-06 DIAGNOSIS — S060X9A Concussion with loss of consciousness of unspecified duration, initial encounter: Secondary | ICD-10-CM

## 2016-01-06 DIAGNOSIS — I6523 Occlusion and stenosis of bilateral carotid arteries: Secondary | ICD-10-CM | POA: Diagnosis not present

## 2016-01-06 DIAGNOSIS — S2232XA Fracture of one rib, left side, initial encounter for closed fracture: Secondary | ICD-10-CM

## 2016-01-06 DIAGNOSIS — R55 Syncope and collapse: Secondary | ICD-10-CM

## 2016-01-06 NOTE — Progress Notes (Signed)
Subjective:  Patient ID: Kurt Sexton, male    DOB: [birth year]  Age: 75 y.o. MRN: [id number]  CC: Loss of Consciousness patient in room with wife who drove.  Loss of Consciousness  This is a recurrent problem. The current episode started in the past 7 days. The problem occurs intermittently. The problem has been resolved. Length of episode of loss of consciousness: unknown. The symptoms are aggravated by urination. Associated symptoms include malaise/fatigue and weakness. Pertinent negatives include no abdominal pain, auditory change, aura, bladder incontinence, bowel incontinence, chest pain, clumsiness, confusion, dizziness, fever, focal sensory loss, focal weakness, headaches, light-headedness, nausea, palpitations, slurred speech, vertigo, visual change or vomiting. He has tried nothing for the symptoms. His past medical history is significant for HTN. There is no history of a sudden death in family, TIA or vertigo.  First episode of syncope occurred January 2017 while in the office with Dr. Antilla Rea. Then he was transported to [facility]: head Ct, EKG and labs were normal. 12/25/2015 he had another syncopal episode while at home. He remembers going to the bathroom to urinate, then passed out. He does not remember how long he was on the floor. Denies any confusion or focal neurological deficit post incident.  12/30/2015 he went to urgent care for evaluation of dizziness and fatigue. He was transported to the hospital. Head CT and labs were normal.  Last echocardiogram December 2016: EF of 65-70%, moderate hypertrophy, normal wall motion, normal valve movement.  Past carotid Doppler November 2017: Mild bilateral stenosis (0-50%).  He denies any use of pain medication in the last 2 weeks. He uses torsemide as needed. Last use 12/24/2015.  Oxycodone prescribed for left rib fracture, has not filled prescription at this time. No SOB, no fever, no cough.  Outpatient Medications  Prior to Visit  Medication Sig Dispense Refill  . aspirin 81 MG EC tablet Take 81 mg by mouth daily.      . betamethasone dipropionate (DIPROLENE) 0.05 % cream Apply 1 application topically 2 (two) times daily. Reported on 05/27/2015    . Cholecalciferol (VITAMIN D3) 2000 units capsule Take 1 capsule (2,000 Units total) by mouth daily. 100 capsule 3  . ergocalciferol (VITAMIN D2) 50000 UNITS capsule Take 1 capsule (50,000 Units total) by mouth once a week. 6 capsule 0  . ferrous sulfate 325 (65 FE) MG tablet Take 1 tablet (325 mg total) by mouth 2 (two) times daily with a meal. 100 tablet 3  . hydrOXYzine (ATARAX/VISTARIL) 25 MG tablet Take 1 tablet (25 mg total) by mouth every 8 (eight) hours as needed for itching or nausea. 60 tablet 1  . labetalol (NORMODYNE) 200 MG tablet TAKE 1 TABLET BY MOUTH 3 TIMES DAILY 270 tablet 2  . meclizine (ANTIVERT) 12.5 MG tablet Take 12.5 mg by mouth every 6 (six) hours as needed for dizziness.    Marland Kitchen oxyCODONE (ROXICODONE) 5 MG immediate release tablet Take 1 tablet (5 mg total) by mouth every 4 (four) hours as needed for severe pain. 15 tablet 0  . tiZANidine (ZANAFLEX) 2 MG tablet Take 1 tablet (2 mg total) by mouth at bedtime as needed for muscle spasms. 30 tablet 5  . torsemide (DEMADEX) 20 MG tablet Take 1 tablet (20 mg total) by mouth daily. 30 tablet 2  . triamcinolone ointment (KENALOG) 0.1 % Apply 1 application topically 2 (two) times daily as needed (skin irritation). 80 g 2  . HYDROcodone-acetaminophen (NORCO) 7.5-325 MG tablet Take 1 tablet by mouth 4 (  four) times daily as needed for moderate pain. 60 tablet 0  . traMADol-acetaminophen (ULTRACET) 37.5-325 MG tablet Take 1 tablet by mouth every 8 (eight) hours as needed for severe pain. 60 tablet 2   No facility-administered medications prior to visit.     ROS See HPI  Objective:  BP 130/60   Pulse 69   Wt 225 lb (102.1 kg)   SpO2 97%   BMI 27.39 kg/m   BP Readings from Last 3 Encounters:    01/06/16 130/60  12/31/15 170/72  12/30/15 148/86    Wt Readings from Last 3 Encounters:  01/06/16 225 lb (102.1 kg)  12/22/15 227 lb (103 kg)  10/16/15 220 lb 4 oz (99.9 kg)    Physical Exam  Constitutional: He is oriented to person, place, and time. No distress.  Eyes: No scleral icterus.  Neck: Normal range of motion. Neck supple.  Cardiovascular: Normal rate and regular rhythm.   Pulmonary/Chest: Effort normal and breath sounds normal.  Abdominal: Soft. Bowel sounds are normal.  Musculoskeletal: He exhibits edema.  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 Ambulates with cane  Neurological: He is alert and oriented to person, place, and time.  Skin: Skin is warm and dry.  Psychiatric: He has a normal mood and affect. His behavior is normal.  Vitals reviewed.   Lab Results  Component Value Date   WBC 8.7 12/30/2015   HGB 11.1 (L) 12/30/2015   HCT 35.1 (L) 12/30/2015   PLT 308 12/30/2015   GLUCOSE 106 (H) 12/30/2015   CHOL 198 06/25/2015   TRIG 121.0 06/25/2015   HDL 39.10 06/25/2015   LDLCALC 135 (H) 06/25/2015   ALT 23 12/30/2015   AST 24 12/30/2015   NA 144 12/30/2015   K 5.1 12/30/2015   CL 112 (H) 12/30/2015   CREATININE 1.21 12/30/2015   BUN 17 12/30/2015   CO2 25 12/30/2015   TSH 2.02 12/22/2015   PSA 10.85 (H) 06/25/2015   INR 1.01 10/25/2010   HGBA1C 5.2 06/25/2015    Dg Ribs Unilateral W/chest Left  Result Date: 12/30/2015 CLINICAL DATA:  Fall last week.  Left chest pain EXAM: LEFT RIBS AND CHEST - 3+ VIEW COMPARISON:  04/16/2015 FINDINGS: Cardiac and mediastinal contours normal. Lungs are clear without infiltrate or effusion. No pneumothorax. BB marks an area of pain in the left anterior lower ribs. There is a nondisplaced fracture left anterior eighth rib. No other rib fracture. IMPRESSION: Nondisplaced fracture left anterior eighth rib. No acute cardiopulmonary abnormality. Electronically Signed   By: Franchot Gallo M.D.   On: 12/30/2015 19:03   Ct Head Wo Contrast  Result Date: 12/30/2015 CLINICAL DATA:  75 y/o M; 45 minutes episode of dizziness and lightheadedness with syncopal episode and fall 5 days prior. EXAM: CT HEAD WITHOUT CONTRAST TECHNIQUE: Contiguous axial images were obtained from the base of the skull through the vertex without intravenous contrast. COMPARISON:  08/07/2015 CT head. FINDINGS: Brain: No evidence of acute infarction, hemorrhage, hydrocephalus, extra-axial collection or mass lesion/mass effect. Stable mild chronic microvascular ischemic changes and parenchymal volume loss. Stable right superior cerebellar encephalomalacia likely representing chronic infarct. Vascular: No hyperdense vessel. Mild calcific atherosclerosis of vertebral and internal carotid arteries appear Skull: No displaced calvarial fracture. Sinuses/Orbits: Visualized paranasal sinuses are clear. Right mastoid air cells are clear. Sclerosis of left posterior mastoid air cells without interval change compatible with sequelae of chronic otomastoiditis. Left external auditory canal partial opacification is probably cerumen. Other: None.  IMPRESSION: 1. No acute intracranial abnormality or displaced calvarial fracture is identified. 2. Stable mild chronic microvascular ischemic changes and parenchymal volume loss. Electronically Signed   By: Kristine Garbe M.D.   On: 12/30/2015 23:52    Assessment & Plan:   Hawkins was seen today for loss of consciousness.  Diagnoses and all orders for this visit:  Syncope and collapse -     Ambulatory referral to Cardiology -     VAS US CAROTID; Future   I have discontinued Mr. Stimmel's traMADol-acetaminophen and HYDROcodone-acetaminophen. I am also having him maintain his aspirin, hydrOXYzine, betamethasone dipropionate, meclizine, ergocalciferol, ferrous sulfate, Vitamin D3, torsemide, tiZANidine, labetalol, triamcinolone ointment, and oxyCODONE.  No orders of the  defined types were placed in this encounter. Collaborated with Dr. Sharlet Salina. Recent Results (from the past 2160 hour(s))  Basic metabolic panel     Status: None   Collection Time: 12/22/15  2:25 PM  Result Value Ref Range   Sodium 139 135 - 145 mEq/L   Potassium 4.5 3.5 - 5.1 mEq/L   Chloride 105 96 - 112 mEq/L   CO2 29 19 - 32 mEq/L   Glucose, Bld 86 70 - 99 mg/dL   BUN 20 6 - 23 mg/dL   Creatinine, Ser 1.28 0.40 - 1.50 mg/dL   Calcium 8.6 8.4 - 10.5 mg/dL   GFR 70.21 >60.00 mL/min  CBC with Differential/Platelet     Status: Abnormal   Collection Time: 12/22/15  2:25 PM  Result Value Ref Range   WBC 7.9 4.0 - 10.5 K/uL   RBC 3.91 (L) 4.22 - 5.81 Mil/uL   Hemoglobin 10.8 (L) 13.0 - 17.0 g/dL   HCT 32.2 (L) 39.0 - 52.0 %   MCV 82.4 78.0 - 100.0 fl   MCHC 33.4 30.0 - 36.0 g/dL   RDW 13.8 11.5 - 15.5 %   Platelets 253.0 150.0 - 400.0 K/uL   Neutrophils Relative % 48.0 43.0 - 77.0 %   Lymphocytes Relative 16.9 12.0 - 46.0 %   Monocytes Relative 11.1 3.0 - 12.0 %   Eosinophils Relative 23.0 Repeated and verified X2. (H) 0.0 - 5.0 %   Basophils Relative 1.0 0.0 - 3.0 %   Neutro Abs 3.8 1.4 - 7.7 K/uL   Lymphs Abs 1.3 0.7 - 4.0 K/uL   Monocytes Absolute 0.9 0.1 - 1.0 K/uL   Eosinophils Absolute 1.8 (H) 0.0 - 0.7 K/uL   Basophils Absolute 0.1 0.0 - 0.1 K/uL  TSH     Status: None   Collection Time: 12/22/15  2:25 PM  Result Value Ref Range   TSH 2.02 0.35 - 4.50 uIU/mL  Brain natriuretic peptide     Status: None   Collection Time: 12/22/15  2:25 PM  Result Value Ref Range   Pro B Natriuretic peptide (BNP) 51.0 0.0 - 100.0 pg/mL  Comprehensive metabolic panel     Status: Abnormal   Collection Time: 12/30/15  5:04 PM  Result Value Ref Range   Sodium 144 135 - 145 mmol/L   Potassium 5.1 3.5 - 5.1 mmol/L   Chloride 112 (H) 101 - 111 mmol/L   CO2 25 22 - 32 mmol/L   Glucose, Bld 106 (H) 65 - 99 mg/dL   BUN 17 6 - 20 mg/dL   Creatinine, Ser 1.21 0.61 - 1.24 mg/dL   Calcium 9.1  8.9 - 10.3 mg/dL   Total Protein 6.8 6.5 - 8.1 g/dL   Albumin 3.2 (L) 3.5 - 5.0 g/dL   AST  24 15 - 41 U/L   ALT 23 17 - 63 U/L   Alkaline Phosphatase 116 38 - 126 U/L   Total Bilirubin 0.8 0.3 - 1.2 mg/dL   GFR calc non Af Amer 56 (L) >60 mL/min   GFR calc Af Amer >60 >60 mL/min    Comment: (NOTE) The eGFR has been calculated using the CKD EPI equation. This calculation has not been validated in all clinical situations. eGFR's persistently <60 mL/min signify possible Chronic Kidney Disease.    Anion gap 7 5 - 15  CBC     Status: Abnormal   Collection Time: 12/30/15  5:04 PM  Result Value Ref Range   WBC 8.7 4.0 - 10.5 K/uL   RBC 4.14 (L) 4.22 - 5.81 MIL/uL   Hemoglobin 11.1 (L) 13.0 - 17.0 g/dL   HCT 35.1 (L) 39.0 - 52.0 %   MCV 84.8 78.0 - 100.0 fL   MCH 26.8 26.0 - 34.0 pg   MCHC 31.6 30.0 - 36.0 g/dL   RDW 13.1 11.5 - 15.5 %   Platelets 308 150 - 400 K/uL   Follow-up: Return if symptoms worsen or fail to improve.  Wilfred Lacy, NP

## 2016-01-06 NOTE — Progress Notes (Signed)
Pre visit review using our clinic review tool, if applicable. No additional management support is needed unless otherwise documented below in the visit note. 

## 2016-01-06 NOTE — Patient Instructions (Addendum)
You will be contacted with appt for carotid US and cardiology evaluation.  Call 911 if syncope  Occurs again.

## 2016-01-07 ENCOUNTER — Encounter: Payer: Self-pay | Admitting: Cardiology

## 2016-01-12 ENCOUNTER — Encounter: Payer: Self-pay | Admitting: Internal Medicine

## 2016-01-12 ENCOUNTER — Encounter (HOSPITAL_BASED_OUTPATIENT_CLINIC_OR_DEPARTMENT_OTHER): Payer: Medicare Other | Attending: Internal Medicine

## 2016-01-12 DIAGNOSIS — H3561 Retinal hemorrhage, right eye: Secondary | ICD-10-CM | POA: Diagnosis not present

## 2016-01-12 DIAGNOSIS — H40023 Open angle with borderline findings, high risk, bilateral: Secondary | ICD-10-CM | POA: Diagnosis not present

## 2016-01-12 DIAGNOSIS — H34831 Tributary (branch) retinal vein occlusion, right eye, with macular edema: Secondary | ICD-10-CM | POA: Diagnosis not present

## 2016-01-12 DIAGNOSIS — H35033 Hypertensive retinopathy, bilateral: Secondary | ICD-10-CM | POA: Diagnosis not present

## 2016-01-13 NOTE — Progress Notes (Signed)
Electrophysiology Office Note   Date:  01/14/2016   ID:  Kurt, Sexton 1941/02/14, MRN [id number]  PCP:  [name], MD  Cardiologist:  Constance Haw, MD    Chief Complaint  Patient presents with  . New Patient (Initial Visit)    syncope and collapse      History of Present Illness: Kurt Sexton is a 75 y.o. male who presents today for electrophysiology evaluation.   Had episode of syncope.   First episode occurred 1/17 while in MD office.  Head CT, ECG labs normal. Second episode 12/25/15. Was going to the bathroom to urinate and passed out.  No confusion post syncope. Second episode 10/10 with dizziness. Head CT and labs normal. Last TTE showed normal EF. He says that he went to the bathroom and had syncope while urinating. He says that he thinks that he was out for approximately 1 hour as he was watching a TV show, and when he awoke had missed approximately an hour of the show. He did not have palpitations, chest pain, or shortness of breath prior to the episode of syncope. He has had one other episode of syncope when he was in his doctor's office. He says that at times his blood pressure was very low because he had increased his labetalol on his own.   Today, he denies symptoms of palpitations, chest pain, shortness of breath, orthopnea, PND, lower extremity edema, claudication, dizziness, presyncope, bleeding, or neurologic sequela. The patient is tolerating medications without difficulties and is otherwise without complaint today.    Past Medical History:  Diagnosis Date  . BPH (benign prostatic hyperplasia)    Dr [name]  . CHF (congestive heart failure) ([place])   . CVA (cerebral vascular accident) ([place]) 1998   denies residual on 04/17/2015  . High cholesterol   . History of gout   . HTN (hypertension)   . LBP (low back pain) 2010  . Osteoarthritis    "right knee; right hand" (04/17/2015)  . Prostate cancer ([place]) 01/2008   gleason 3+4=7, PSA 4.93, vol 34.18 cc    . Venous insufficiency   . Vitamin D deficiency    Past Surgical History:  Procedure Laterality Date  . ANTERIOR CERVICAL DECOMP/DISCECTOMY FUSION N/A 11/22/2014   Procedure: Cervical Six Corpectomy with graft and plating Cervical Five-Seven;  Surgeon: Erline Levine, MD;  Location: [facility];  Service: Neurosurgery;  Laterality: N/A;  . BACK SURGERY    . CORRECTION HAMMER TOE Left   . INGUINAL HERNIA REPAIR Bilateral   . KNEE ARTHROSCOPY W/ PCL AND LCL  REPAIR & TENDON GRAFT Left   . PROSTATE BIOPSY  01/2008   gleason 7  . PROSTATE BIOPSY  03/27/13   gleason 3+4=7, volume 34.18 cc  . TONSILLECTOMY       Current Outpatient Prescriptions  Medication Sig Dispense Refill  . aspirin 81 MG EC tablet Take 81 mg by mouth daily.      . betamethasone dipropionate (DIPROLENE) 0.05 % cream Apply 1 application topically 2 (two) times daily. Reported on 05/27/2015    . Cholecalciferol (VITAMIN D3) 2000 units capsule Take 1 capsule (2,000 Units total) by mouth daily. 100 capsule 3  . ergocalciferol (VITAMIN D2) 50000 UNITS capsule Take 1 capsule (50,000 Units total) by mouth once a week. 6 capsule 0  . ferrous sulfate 325 (65 FE) MG tablet Take 1 tablet (325 mg total) by mouth 2 (two) times daily with a meal. 100 tablet 3  .  hydrOXYzine (ATARAX/VISTARIL) 25 MG tablet Take 1 tablet (25 mg total) by mouth every 8 (eight) hours as needed for itching or nausea. 60 tablet 1  . labetalol (NORMODYNE) 200 MG tablet TAKE 1 TABLET BY MOUTH 3 TIMES DAILY 270 tablet 2  . meclizine (ANTIVERT) 12.5 MG tablet Take 12.5 mg by mouth every 6 (six) hours as needed for dizziness.    Marland Kitchen oxyCODONE (ROXICODONE) 5 MG immediate release tablet Take 1 tablet (5 mg total) by mouth every 4 (four) hours as needed for severe pain. 15 tablet 0  . tiZANidine (ZANAFLEX) 2 MG tablet Take 1 tablet (2 mg total) by mouth at bedtime as needed for muscle spasms. 30 tablet 5  . torsemide (DEMADEX) 20 MG tablet Take 1 tablet (20 mg total) by  mouth daily. 30 tablet 2  . triamcinolone ointment (KENALOG) 0.1 % Apply 1 application topically 2 (two) times daily as needed (skin irritation). 80 g 2   No current facility-administered medications for this visit.     Allergies:   Diazepam; Amlodipine besylate; Cozaar; Doxazosin mesylate; Lisinopril; Ramipril; Spironolactone; and Bactrim [sulfamethoxazole-trimethoprim]   Social History:  The patient  reports that he has never smoked. He has never used smokeless tobacco. He reports that he does not drink alcohol or use drugs.   Family History:  The patient's family history includes Cancer in his brother; Diabetes in his other; Hypertension in his other.    ROS:  Please see the history of present illness.   Otherwise, review of systems is positive for balance problems, syncope, rash, leg pain, weigh loss, leg swelling.   All other systems are reviewed and negative.    PHYSICAL EXAM: VS:  BP (!) 166/72 (BP Location: Right Arm)   Pulse 76   Ht 6' 4.5" (1.943 m)   Wt 231 lb 9.6 oz (105.1 kg)   BMI 27.82 kg/m  , BMI Body mass index is 27.82 kg/m. GEN: Well nourished, well developed, in no acute distress  HEENT: normal  Neck: no JVD, carotid bruits, or masses Cardiac: RRR; no murmurs, rubs, or gallops, 3+ edema to the knee  Respiratory:  clear to auscultation bilaterally, normal work of breathing GI: soft, nontender, nondistended, + BS MS: no deformity or atrophy  Skin: warm and dry Neuro:  Strength and sensation are intact Psych: euthymic mood, full affect  EKG:  EKG is ordered today. Personal review of the ekg ordered shows sinus rhythm, early R/S transition, rate 76  Recent Labs: 02/19/2015: Magnesium 2.1 04/16/2015: B Natriuretic Peptide 109.7 12/22/2015: Pro B Natriuretic peptide (BNP) 51.0; TSH 2.02 12/30/2015: ALT 23; BUN 17; Creatinine, Ser 1.21; Hemoglobin 11.1; Platelets 308; Potassium 5.1; Sodium 144    Lipid Panel     Component Value Date/Time   CHOL 198  06/25/2015 1210   TRIG 121.0 06/25/2015 1210   HDL 39.10 06/25/2015 1210   CHOLHDL 5 06/25/2015 1210   VLDL 24.2 06/25/2015 1210   LDLCALC 135 (H) 06/25/2015 1210     Wt Readings from Last 3 Encounters:  01/14/16 231 lb 9.6 oz (105.1 kg)  01/06/16 225 lb (102.1 kg)  12/22/15 227 lb (103 kg)      Other studies Reviewed: Additional studies/ records that were reviewed today include: TTE 12/17  Review of the above records today demonstrates:  - Left ventricle: The cavity size was normal. There was moderate   concentric hypertrophy. Systolic function was vigorous. The   estimated ejection fraction was in the range of 65% to 70%. Wall  motion was normal; there were no regional wall motion   abnormalities. Doppler parameters are consistent with abnormal   left ventricular relaxation (grade 1 diastolic dysfunction). - Aortic root: The aortic root was mildly dilated.   ASSESSMENT AND PLAN:  1.  Syncope: At this time the cause of his syncope appears to be due to micturition syncope. That being said, he does have significant lower extremity edema and a new tall R-wave in lead V1 that could be indicative of the posterior wall MI. Kurt Sexton order an echocardiogram to further determine his ejection fraction. He says that he does not drive.  2. Hypertension: Elevated today. He has had problems with hypotension in the past. We'll have him check blood pressures at home and call his back with results.     Current medicines are reviewed at length with the patient today.   The patient does not have concerns regarding his medicines.  The following changes were made today:  none  Labs/ tests ordered today include:  Orders Placed This Encounter  Procedures  . EKG 12-Lead  . ECHOCARDIOGRAM COMPLETE     Disposition:   FU with Roslyn Else pending TTE  Signed, Flordia Kassem Meredith Leeds, MD  01/14/2016 2:53 PM     [place]  [id number] [phone] (office) [phone] (fax)

## 2016-01-14 ENCOUNTER — Ambulatory Visit (INDEPENDENT_AMBULATORY_CARE_PROVIDER_SITE_OTHER): Payer: Medicare Other | Admitting: Cardiology

## 2016-01-14 ENCOUNTER — Encounter: Payer: Self-pay | Admitting: Cardiology

## 2016-01-14 VITALS — BP 166/72 | HR 76 | Ht 76.5 in | Wt 231.6 lb

## 2016-01-14 DIAGNOSIS — R55 Syncope and collapse: Secondary | ICD-10-CM | POA: Diagnosis not present

## 2016-01-14 NOTE — Patient Instructions (Addendum)
Medication Instructions:    Your physician recommends that you continue on your current medications as directed. Please refer to the Current Medication list given to you today.  --- If you need a refill on your cardiac medications before your next appointment, please call your pharmacy. ---  Labwork:  None ordered  Testing/Procedures: Your physician has requested that you have an echocardiogram. Echocardiography is a painless test that uses sound waves to create images of your heart. It provides your doctor with information about the size and shape of your heart and how well your heart's chambers and valves are working. This procedure takes approximately one hour. There are no restrictions for this procedure.  Follow-Up:  Follow up to be determined upon review of echocardiogram results.  Thank you for choosing [facility]!!   Trinidad Curet, RN [phone]   Any Other Special Instructions Will Be Listed Below (If Applicable).  Please monitor your blood pressure at home.

## 2016-01-21 ENCOUNTER — Other Ambulatory Visit (HOSPITAL_COMMUNITY): Payer: Medicare Other

## 2016-01-22 ENCOUNTER — Ambulatory Visit (HOSPITAL_COMMUNITY)
Admission: RE | Admit: 2016-01-22 | Discharge: 2016-01-22 | Disposition: A | Discharge status: Home / Self Care | Payer: Medicare Other | Source: Ambulatory Visit | Attending: Nurse Practitioner | Admitting: Nurse Practitioner

## 2016-01-22 DIAGNOSIS — I6523 Occlusion and stenosis of bilateral carotid arteries: Secondary | ICD-10-CM | POA: Diagnosis not present

## 2016-01-22 DIAGNOSIS — R55 Syncope and collapse: Secondary | ICD-10-CM | POA: Diagnosis not present

## 2016-01-22 LAB — VAS US CAROTID
LCCADSYS: -146 cm/s
LCCAPDIAS: 20 cm/s
LEFT ECA DIAS: -17 cm/s
LEFT VERTEBRAL DIAS: -27 cm/s
LICADDIAS: -33 cm/s
LICAPDIAS: -28 cm/s
LICAPSYS: -130 cm/s
Left CCA dist dias: -18 cm/s
Left CCA prox sys: 209 cm/s
Left ICA dist sys: -129 cm/s
RCCAPDIAS: 17 cm/s
RCCAPSYS: 172 cm/s
RIGHT ECA DIAS: -9 cm/s
RIGHT VERTEBRAL DIAS: -20 cm/s
Right cca dist sys: -142 cm/s

## 2016-01-22 NOTE — Progress Notes (Signed)
VASCULAR LAB PRELIMINARY  PRELIMINARY  PRELIMINARY  PRELIMINARY  Carotid duplex completed.    Preliminary report:  1-39% ICA plaquing.  Vertebral artery flow is antegrade.  Tanay Misuraca, RVT 01/22/2016, 2:21 PM

## 2016-02-11 ENCOUNTER — Other Ambulatory Visit: Payer: Self-pay

## 2016-02-11 ENCOUNTER — Ambulatory Visit (HOSPITAL_COMMUNITY): Payer: Medicare Other | Attending: Cardiology

## 2016-02-11 DIAGNOSIS — I872 Venous insufficiency (chronic) (peripheral): Secondary | ICD-10-CM | POA: Insufficient documentation

## 2016-02-11 DIAGNOSIS — I509 Heart failure, unspecified: Secondary | ICD-10-CM | POA: Diagnosis not present

## 2016-02-11 DIAGNOSIS — I110 Hypertensive heart disease with heart failure: Secondary | ICD-10-CM | POA: Diagnosis not present

## 2016-02-11 DIAGNOSIS — R55 Syncope and collapse: Secondary | ICD-10-CM | POA: Diagnosis not present

## 2016-02-11 DIAGNOSIS — Z8673 Personal history of transient ischemic attack (TIA), and cerebral infarction without residual deficits: Secondary | ICD-10-CM | POA: Insufficient documentation

## 2016-02-11 DIAGNOSIS — E785 Hyperlipidemia, unspecified: Secondary | ICD-10-CM | POA: Insufficient documentation

## 2016-02-11 DIAGNOSIS — I081 Rheumatic disorders of both mitral and tricuspid valves: Secondary | ICD-10-CM | POA: Diagnosis not present

## 2016-02-20 ENCOUNTER — Other Ambulatory Visit: Payer: Self-pay | Admitting: Internal Medicine

## 2016-03-03 ENCOUNTER — Encounter: Payer: Self-pay | Admitting: Cardiology

## 2016-03-09 ENCOUNTER — Ambulatory Visit (INDEPENDENT_AMBULATORY_CARE_PROVIDER_SITE_OTHER): Payer: Medicare Other | Admitting: Cardiology

## 2016-03-09 ENCOUNTER — Encounter: Payer: Self-pay | Admitting: Cardiology

## 2016-03-09 VITALS — BP 148/70 | HR 73 | Ht 76.0 in | Wt 236.4 lb

## 2016-03-09 DIAGNOSIS — R55 Syncope and collapse: Secondary | ICD-10-CM

## 2016-03-09 NOTE — Patient Instructions (Addendum)
Medication Instructions:  Your physician recommends that you continue on your current medications as directed. Please refer to the Current Medication list given to you today.  * If you need a refill on your cardiac medications before your next appointment, please call your pharmacy.   Labwork: None ordered  Testing/Procedures: None ordered  Follow-Up: No follow up is needed at this time with Dr. Camnitz.  He will see you on an as needed basis.   Thank you for choosing [facility]!!   Pocahontas Cohenour, RN [phone]     

## 2016-03-09 NOTE — Progress Notes (Signed)
Electrophysiology Office Note   Date:  03/09/2016   ID:  Kurt Sexton, DOB October 07, 1940, MRN [id number]  PCP:  Kurt Kehr, MD  Cardiologist:  Kurt Haw, MD    Chief Complaint  Patient presents with  . Follow-up    Syncope     History of Present Illness: Kurt Sexton is a 75 y.o. male who presents today for electrophysiology evaluation.   Had episode of syncope.   First episode occurred 1/17 while in MD office.  Head CT, ECG labs normal. Second episode 12/25/15. Was going to the bathroom to urinate and passed out.  No confusion post syncope. Second episode 10/10 with dizziness. Head CT and labs normal. Last TTE showed normal EF. He says that he went to the bathroom and had syncope while urinating. He has not had any further issues with syncope since last being seen. He says he has been feeling well without any major complaints. He is able to do all of his daily activities. He is continuing to not drive.   Today, he denies symptoms of palpitations, chest pain, shortness of breath, orthopnea, PND, lower extremity edema, claudication, dizziness, presyncope, bleeding, or neurologic sequela. The patient is tolerating medications without difficulties and is otherwise without complaint today.    Past Medical History:  Diagnosis Date  . BPH (benign prostatic hyperplasia)    Dr [name]  . CHF (congestive heart failure) ([place])   . CVA (cerebral vascular accident) ([place]) 1998   denies residual on 04/17/2015  . High cholesterol   . History of gout   . HTN (hypertension)   . LBP (low back pain) 2010  . Osteoarthritis    "right knee; right hand" (04/17/2015)  . Prostate cancer ([place]) 01/2008   gleason 3+4=7, PSA 4.93, vol 34.18 cc  . Venous insufficiency   . Vitamin D deficiency    Past Surgical History:  Procedure Laterality Date  . ANTERIOR CERVICAL DECOMP/DISCECTOMY FUSION N/A 11/22/2014   Procedure: Cervical Six Corpectomy with graft and plating Cervical Five-Seven;  Surgeon:  Erline Levine, MD;  Location: [facility];  Service: Neurosurgery;  Laterality: N/A;  . BACK SURGERY    . CORRECTION HAMMER TOE Left   . INGUINAL HERNIA REPAIR Bilateral   . KNEE ARTHROSCOPY W/ PCL AND LCL  REPAIR & TENDON GRAFT Left   . PROSTATE BIOPSY  01/2008   gleason 7  . PROSTATE BIOPSY  03/27/13   gleason 3+4=7, volume 34.18 cc  . TONSILLECTOMY       Current Outpatient Prescriptions  Medication Sig Dispense Refill  . aspirin 81 MG EC tablet Take 81 mg by mouth daily.      . betamethasone dipropionate (DIPROLENE) 0.05 % cream Apply 1 application topically 2 (two) times daily. Reported on 05/27/2015    . Cholecalciferol (VITAMIN D3) 2000 units capsule Take 1 capsule (2,000 Units total) by mouth daily. 100 capsule 3  . ergocalciferol (VITAMIN D2) 50000 UNITS capsule Take 1 capsule (50,000 Units total) by mouth once a week. 6 capsule 0  . ferrous sulfate 325 (65 FE) MG tablet Take 1 tablet (325 mg total) by mouth 2 (two) times daily with a meal. 100 tablet 3  . hydrOXYzine (ATARAX/VISTARIL) 25 MG tablet Take 1 tablet (25 mg total) by mouth every 8 (eight) hours as needed for itching or nausea. 60 tablet 1  . labetalol (NORMODYNE) 200 MG tablet TAKE 1 TABLET BY MOUTH 3 TIMES DAILY 270 tablet 2  . meclizine (ANTIVERT) 12.5 MG tablet  Take 12.5 mg by mouth every 6 (six) hours as needed for dizziness.    Marland Kitchen oxyCODONE (ROXICODONE) 5 MG immediate release tablet Take 1 tablet (5 mg total) by mouth every 4 (four) hours as needed for severe pain. 15 tablet 0  . tiZANidine (ZANAFLEX) 2 MG tablet Take 1 tablet (2 mg total) by mouth at bedtime as needed for muscle spasms. 30 tablet 5  . torsemide (DEMADEX) 20 MG tablet Take 1 tablet (20 mg total) by mouth daily. 30 tablet 2  . triamcinolone ointment (KENALOG) 0.1 % apply to affected area twice a day if needed for SKIN IRRITATION 80 g 2   No current facility-administered medications for this visit.     Allergies:   Diazepam; Amlodipine besylate;  Cozaar; Doxazosin mesylate; Lisinopril; Ramipril; Spironolactone; and Bactrim [sulfamethoxazole-trimethoprim]   Social History:  The patient  reports that he has never smoked. He has never used smokeless tobacco. He reports that he does not drink alcohol or use drugs.   Family History:  The patient's family history includes Cancer in his brother; Diabetes in his other; Hypertension in his other.    ROS:  Please see the history of present illness.   Otherwise, review of systems is positive for rash, balance problems, shoring, hearing loss, leg swelling, leg pain.   All other systems are reviewed and negative.    PHYSICAL EXAM: VS:  BP (!) 148/70   Pulse 73   Ht 6\' 4"  (1.93 m)   Wt 236 lb 6.4 oz (107.2 kg)   BMI 28.78 kg/m  , BMI Body mass index is 28.78 kg/m. GEN: Well nourished, well developed, in no acute distress  HEENT: normal  Neck: no JVD, carotid bruits, or masses Cardiac: RRR; no murmurs, rubs, or gallops, 3+ edema to the knee  Respiratory:  clear to auscultation bilaterally, normal work of breathing GI: soft, nontender, nondistended, + BS MS: no deformity or atrophy  Skin: warm and dry Neuro:  Strength and sensation are intact Psych: euthymic mood, full affect  EKG:  EKG is ordered today. Personal review of the ekg ordered  shows sinus rhythm, early R/S transition, rate 73   Recent Labs: 04/16/2015: B Natriuretic Peptide 109.7 12/22/2015: Pro B Natriuretic peptide (BNP) 51.0; TSH 2.02 12/30/2015: ALT 23; BUN 17; Creatinine, Ser 1.21; Hemoglobin 11.1; Platelets 308; Potassium 5.1; Sodium 144    Lipid Panel     Component Value Date/Time   CHOL 198 06/25/2015 1210   TRIG 121.0 06/25/2015 1210   HDL 39.10 06/25/2015 1210   CHOLHDL 5 06/25/2015 1210   VLDL 24.2 06/25/2015 1210   LDLCALC 135 (H) 06/25/2015 1210     Wt Readings from Last 3 Encounters:  03/09/16 236 lb 6.4 oz (107.2 kg)  01/14/16 231 lb 9.6 oz (105.1 kg)  01/06/16 225 lb (102.1 kg)      Other  studies Reviewed: Additional studies/ records that were reviewed today include: TTE 11/17  Review of the above records today demonstrates:  - Left ventricle: The cavity size was normal. Wall thickness was   increased in a pattern of mild LVH. Systolic function was normal.   The estimated ejection fraction was in the range of 55% to 60%.   Wall motion was normal; there were no regional wall motion   abnormalities. Features are consistent with a pseudonormal left   ventricular filling pattern, with concomitant abnormal relaxation   and increased filling pressure (grade 2 diastolic dysfunction). - Mitral valve: There was mild regurgitation. - Left  atrium: The atrium was moderately dilated. - Right ventricle: The cavity size was mildly dilated. Wall   thickness was normal. - Right atrium: The atrium was mildly dilated. - Tricuspid valve: There was mild regurgitation.   ASSESSMENT AND PLAN:  1.  Syncope: At this time the cause of his syncope appears to be due to micturition syncope. No further episodes of syncope. He passed out on October 4. He Omega Slager not drive for 6 months. He did have an echocardiogram for an abnormal EKG which showed no major abnormalities.  2. Hypertension: Elevated today. He Maziah Smola continue to monitor home and touch base with his primary physician should his blood pressure remain elevated.     Current medicines are reviewed at length with the patient today.   The patient does not have concerns regarding his medicines.  The following changes were made today:  none  Labs/ tests ordered today include:  Orders Placed This Encounter  Procedures  . EKG 12-Lead     Disposition:   FU with Meher Kucinski PRN  Signed, Jaice Lague Meredith Leeds, MD  03/09/2016 12:20 PM     [place] [id number] [phone] (office) [phone] (fax)

## 2016-04-09 ENCOUNTER — Ambulatory Visit: Payer: Medicare Other | Admitting: Podiatry

## 2016-04-20 ENCOUNTER — Ambulatory Visit: Payer: Medicare Other | Admitting: Sports Medicine

## 2016-04-22 ENCOUNTER — Encounter (HOSPITAL_COMMUNITY): Payer: Self-pay | Admitting: Emergency Medicine

## 2016-04-22 ENCOUNTER — Observation Stay (HOSPITAL_COMMUNITY)
Admission: EM | Admit: 2016-04-22 | Discharge: 2016-04-24 | Disposition: A | Discharge status: Home / Self Care | Payer: Medicare Other | Attending: Internal Medicine | Admitting: Internal Medicine

## 2016-04-22 ENCOUNTER — Emergency Department (HOSPITAL_COMMUNITY): Payer: Medicare Other

## 2016-04-22 DIAGNOSIS — M1711 Unilateral primary osteoarthritis, right knee: Secondary | ICD-10-CM | POA: Diagnosis not present

## 2016-04-22 DIAGNOSIS — Z8546 Personal history of malignant neoplasm of prostate: Secondary | ICD-10-CM | POA: Insufficient documentation

## 2016-04-22 DIAGNOSIS — I952 Hypotension due to drugs: Principal | ICD-10-CM | POA: Diagnosis present

## 2016-04-22 DIAGNOSIS — R55 Syncope and collapse: Secondary | ICD-10-CM | POA: Diagnosis not present

## 2016-04-22 DIAGNOSIS — Z882 Allergy status to sulfonamides status: Secondary | ICD-10-CM | POA: Insufficient documentation

## 2016-04-22 DIAGNOSIS — M19041 Primary osteoarthritis, right hand: Secondary | ICD-10-CM | POA: Diagnosis not present

## 2016-04-22 DIAGNOSIS — Z8673 Personal history of transient ischemic attack (TIA), and cerebral infarction without residual deficits: Secondary | ICD-10-CM | POA: Insufficient documentation

## 2016-04-22 DIAGNOSIS — N183 Chronic kidney disease, stage 3 unspecified: Secondary | ICD-10-CM | POA: Diagnosis present

## 2016-04-22 DIAGNOSIS — N400 Enlarged prostate without lower urinary tract symptoms: Secondary | ICD-10-CM | POA: Diagnosis not present

## 2016-04-22 DIAGNOSIS — I5032 Chronic diastolic (congestive) heart failure: Secondary | ICD-10-CM | POA: Diagnosis not present

## 2016-04-22 DIAGNOSIS — D649 Anemia, unspecified: Secondary | ICD-10-CM | POA: Diagnosis present

## 2016-04-22 DIAGNOSIS — G629 Polyneuropathy, unspecified: Secondary | ICD-10-CM | POA: Insufficient documentation

## 2016-04-22 DIAGNOSIS — Z9114 Patient's other noncompliance with medication regimen: Secondary | ICD-10-CM | POA: Diagnosis not present

## 2016-04-22 DIAGNOSIS — Q899 Congenital malformation, unspecified: Secondary | ICD-10-CM

## 2016-04-22 DIAGNOSIS — T502X5A Adverse effect of carbonic-anhydrase inhibitors, benzothiadiazides and other diuretics, initial encounter: Secondary | ICD-10-CM | POA: Insufficient documentation

## 2016-04-22 DIAGNOSIS — Z7982 Long term (current) use of aspirin: Secondary | ICD-10-CM | POA: Insufficient documentation

## 2016-04-22 DIAGNOSIS — I130 Hypertensive heart and chronic kidney disease with heart failure and stage 1 through stage 4 chronic kidney disease, or unspecified chronic kidney disease: Secondary | ICD-10-CM | POA: Diagnosis not present

## 2016-04-22 DIAGNOSIS — Z79899 Other long term (current) drug therapy: Secondary | ICD-10-CM | POA: Diagnosis not present

## 2016-04-22 DIAGNOSIS — R404 Transient alteration of awareness: Secondary | ICD-10-CM | POA: Diagnosis not present

## 2016-04-22 DIAGNOSIS — Z79891 Long term (current) use of opiate analgesic: Secondary | ICD-10-CM | POA: Diagnosis not present

## 2016-04-22 DIAGNOSIS — E7800 Pure hypercholesterolemia, unspecified: Secondary | ICD-10-CM | POA: Diagnosis not present

## 2016-04-22 DIAGNOSIS — Z0389 Encounter for observation for other suspected diseases and conditions ruled out: Secondary | ICD-10-CM | POA: Diagnosis not present

## 2016-04-22 DIAGNOSIS — R569 Unspecified convulsions: Secondary | ICD-10-CM | POA: Diagnosis not present

## 2016-04-22 LAB — COMPREHENSIVE METABOLIC PANEL
ALK PHOS: 96 U/L (ref 38–126)
ALT: 20 U/L (ref 17–63)
AST: 23 U/L (ref 15–41)
Albumin: 3.6 g/dL (ref 3.5–5.0)
Anion gap: 9 (ref 5–15)
BUN: 20 mg/dL (ref 6–20)
CALCIUM: 8.9 mg/dL (ref 8.9–10.3)
CHLORIDE: 106 mmol/L (ref 101–111)
CO2: 24 mmol/L (ref 22–32)
CREATININE: 1.37 mg/dL — AB (ref 0.61–1.24)
GFR calc Af Amer: 56 mL/min — ABNORMAL LOW (ref >60)
GFR calc non Af Amer: 49 mL/min — ABNORMAL LOW (ref >60)
GLUCOSE: 100 mg/dL — AB (ref 65–99)
Potassium: 4.1 mmol/L (ref 3.5–5.1)
SODIUM: 139 mmol/L (ref 135–145)
Total Bilirubin: 0.7 mg/dL (ref 0.3–1.2)
Total Protein: 6.8 g/dL (ref 6.5–8.1)

## 2016-04-22 LAB — I-STAT TROPONIN, ED: Troponin i, poc: 0 ng/mL (ref 0.00–0.08)

## 2016-04-22 LAB — CBC WITH DIFFERENTIAL/PLATELET
BASOS ABS: 0.1 10*3/uL (ref 0.0–0.1)
Basophils Relative: 1 %
EOS ABS: 1.1 10*3/uL — AB (ref 0.0–0.7)
EOS PCT: 13 %
HCT: 34.3 % — ABNORMAL LOW (ref 39.0–52.0)
HEMOGLOBIN: 10.8 g/dL — AB (ref 13.0–17.0)
LYMPHS ABS: 1.7 10*3/uL (ref 0.7–4.0)
LYMPHS PCT: 21 %
MCH: 26.4 pg (ref 26.0–34.0)
MCHC: 31.5 g/dL (ref 30.0–36.0)
MCV: 83.9 fL (ref 78.0–100.0)
Monocytes Absolute: 0.4 10*3/uL (ref 0.1–1.0)
Monocytes Relative: 5 %
NEUTROS PCT: 60 %
Neutro Abs: 4.8 10*3/uL (ref 1.7–7.7)
PLATELETS: 226 10*3/uL (ref 150–400)
RBC: 4.09 MIL/uL — AB (ref 4.22–5.81)
RDW: 13.4 % (ref 11.5–15.5)
WBC: 8.1 10*3/uL (ref 4.0–10.5)

## 2016-04-22 MED ORDER — SODIUM CHLORIDE 0.9 % IV BOLUS (SEPSIS): 1000.0000 mL | Freq: Once | INTRAVENOUS | Status: DC

## 2016-04-22 MED ORDER — SODIUM CHLORIDE 0.9 % IV BOLUS (SEPSIS)
500.0000 mL | Freq: Once | INTRAVENOUS | Status: AC
  Administered 2016-04-22: 500 mL via INTRAVENOUS

## 2016-04-22 NOTE — ED Notes (Signed)
Pt to CT at this time.

## 2016-04-22 NOTE — ED Triage Notes (Signed)
Pt was incontinent. A&O x4 now.

## 2016-04-22 NOTE — ED Triage Notes (Signed)
Per [facility] pt coming from home.Wife states pt stiffened up and shook some. Pt was unresponsive for some time. FD states ALOC upon there arrival. FD states possibly post ictal. Pt does not remember them being there. C/o ABD pain.
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
## 2016-04-23 ENCOUNTER — Observation Stay (HOSPITAL_COMMUNITY): Payer: Medicare Other

## 2016-04-23 ENCOUNTER — Observation Stay (HOSPITAL_BASED_OUTPATIENT_CLINIC_OR_DEPARTMENT_OTHER)
Admit: 2016-04-23 | Discharge: 2016-04-23 | Disposition: A | Discharge status: Home / Self Care | Payer: Medicare Other | Attending: Internal Medicine | Admitting: Internal Medicine

## 2016-04-23 DIAGNOSIS — D649 Anemia, unspecified: Secondary | ICD-10-CM

## 2016-04-23 DIAGNOSIS — R569 Unspecified convulsions: Secondary | ICD-10-CM | POA: Diagnosis not present

## 2016-04-23 DIAGNOSIS — R55 Syncope and collapse: Secondary | ICD-10-CM

## 2016-04-23 DIAGNOSIS — N183 Chronic kidney disease, stage 3 unspecified: Secondary | ICD-10-CM | POA: Diagnosis present

## 2016-04-23 DIAGNOSIS — I10 Essential (primary) hypertension: Secondary | ICD-10-CM

## 2016-04-23 DIAGNOSIS — R930 Abnormal findings on diagnostic imaging of skull and head, not elsewhere classified: Secondary | ICD-10-CM | POA: Diagnosis not present

## 2016-04-23 LAB — MAGNESIUM: Magnesium: 2.2 mg/dL (ref 1.7–2.4)

## 2016-04-23 MED ORDER — FERROUS SULFATE 325 (65 FE) MG PO TABS
325.0000 mg | ORAL_TABLET | Freq: Every day | ORAL | Status: DC
  Administered 2016-04-23 – 2016-04-24 (×2): 325 mg via ORAL
  Filled 2016-04-23 (×2): qty 1

## 2016-04-23 MED ORDER — ACETAMINOPHEN 325 MG PO TABS: 650.0000 mg | ORAL_TABLET | ORAL | Status: DC | PRN

## 2016-04-23 MED ORDER — ENOXAPARIN SODIUM 40 MG/0.4ML ~~LOC~~ SOLN
40.0000 mg | SUBCUTANEOUS | Status: DC
  Administered 2016-04-23 – 2016-04-24 (×2): 40 mg via SUBCUTANEOUS
  Filled 2016-04-23 (×2): qty 0.4

## 2016-04-23 MED ORDER — VITAMIN D 1000 UNITS PO TABS
2000.0000 [IU] | ORAL_TABLET | Freq: Every day | ORAL | Status: DC
  Administered 2016-04-23 – 2016-04-24 (×2): 2000 [IU] via ORAL
  Filled 2016-04-23 (×2): qty 2

## 2016-04-23 MED ORDER — ACETAMINOPHEN 650 MG RE SUPP: 650.0000 mg | RECTAL | Status: DC | PRN

## 2016-04-23 MED ORDER — ASPIRIN EC 81 MG PO TBEC
81.0000 mg | DELAYED_RELEASE_TABLET | Freq: Every day | ORAL | Status: DC
  Administered 2016-04-23 – 2016-04-24 (×2): 81 mg via ORAL
  Filled 2016-04-23 (×2): qty 1

## 2016-04-23 MED ORDER — SODIUM CHLORIDE 0.9 % IV SOLN
75.0000 mL/h | INTRAVENOUS | Status: DC
  Administered 2016-04-23: 75 mL/h via INTRAVENOUS

## 2016-04-23 MED ORDER — LABETALOL HCL 200 MG PO TABS
200.0000 mg | ORAL_TABLET | Freq: Three times a day (TID) | ORAL | Status: DC
  Administered 2016-04-23 – 2016-04-24 (×4): 200 mg via ORAL
  Filled 2016-04-23 (×4): qty 1

## 2016-04-23 MED ORDER — ONDANSETRON HCL 4 MG PO TABS: 4.0000 mg | ORAL_TABLET | Freq: Four times a day (QID) | ORAL | Status: DC | PRN

## 2016-04-23 MED ORDER — ONDANSETRON HCL 4 MG/2ML IJ SOLN: 4.0000 mg | Freq: Four times a day (QID) | INTRAMUSCULAR | Status: DC | PRN

## 2016-04-23 MED ORDER — HYDROCERIN EX CREA
TOPICAL_CREAM | Freq: Every day | CUTANEOUS | Status: DC
  Administered 2016-04-23 – 2016-04-24 (×2): via TOPICAL
  Filled 2016-04-23: qty 113

## 2016-04-23 MED ORDER — LORAZEPAM 2 MG/ML IJ SOLN: 1.0000 mg | INTRAMUSCULAR | Status: DC | PRN

## 2016-04-23 NOTE — Plan of Care (Signed)
Problem: Education: Goal: Knowledge of [facility] Education information/materials will improve Outcome: Progressing POC reviewednwith pt.

## 2016-04-23 NOTE — H&P (Signed)
History and Physical    AD GLAVE [id number] DOB: 04-02-1940 DOA: 04/22/2016  Referring MD/NP/PA: Ninetta Sexton PCP: Kurt Kehr, MD  Patient coming from: Home via EMS  Chief Complaint: Kurt Sexton like activity  HPI: Kurt Sexton is a 76 y.o. 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 Patient reports that he had been in his normal state of health and had been eating dinner at that time of the episode. Patient notes feeling flushed and slightly sick on his stomach. He initially thought symptoms would resolve on their own and that was the last thing he recalls before seeing EMS personnel around him. Per review of records it appears that the patient had became unresponsive, stiffened up, and had some shaking of his extremities. Somehow he had not fallen out of the chair that he was sitting in. He was incontinent of urine. Symptoms lasted anywhere from 5-7 minutes and when EMS arrived patient was noted to be somewhat confused but quickly became alert and oriented 3 and had been complaining of some epigastric abdominal soreness that self resolved as the patient was in route to the hospital. Patient notes that he's had at least 5 episodes of syncope over the last 2 years. He reports one episode following micturition for which she was evaluated by neurology. One other episode happened similar to his presentation today. Patient has no previous history of seizures. Denies any shortness of breath, chest pain, diarrhea, current abdominal pain, or fever.  ED Course: On admission  patient was evaluated and seen have a normal CT scan of the brain. Lab work was relatively unremarkable from patient's baseline. Neurology was consulted and recommended start her workup with MRI of the brain and EEG.    Review of Systems: As per HPI otherwise 10 point review of systems negative.    Past Medical History:  Diagnosis Date  . BPH (benign prostatic hyperplasia)    Dr [name]  . CHF (congestive heart failure) ([place])   . CVA (cerebral vascular accident) ([place]) 1998   denies residual on 04/17/2015  . High cholesterol   . History of gout   . HTN (hypertension)   . LBP (low back pain) 2010  . Osteoarthritis    "right knee; right hand" (04/17/2015)  . Prostate cancer ([place]) 01/2008   gleason 3+4=7, PSA 4.93, vol 34.18 cc  . Venous insufficiency   . Vitamin D deficiency     Past Surgical History:  Procedure Laterality Date  . ANTERIOR CERVICAL DECOMP/DISCECTOMY FUSION N/A 11/22/2014   Procedure: Cervical Six Corpectomy with graft and plating Cervical Five-Seven;  Surgeon: Erline Levine, MD;  Location: [facility];  Service: Neurosurgery;  Laterality: N/A;  . BACK SURGERY    . CORRECTION HAMMER TOE Left   . INGUINAL HERNIA REPAIR Bilateral   . KNEE ARTHROSCOPY W/ PCL AND LCL  REPAIR & TENDON GRAFT Left   . PROSTATE BIOPSY  01/2008   gleason 7  . PROSTATE BIOPSY  03/27/13   gleason 3+4=7, volume 34.18 cc  . TONSILLECTOMY       reports that he has never smoked. He has never used smokeless tobacco. He reports that he does not drink alcohol or use drugs.  Allergies  Allergen Reactions  . Diazepam Other (See Comments)    lethargic  . Amlodipine Besylate Other (See Comments)    dizzy  . Cozaar  Severe headache  . Doxazosin Mesylate Other (See Comments)    dizzy  . Lisinopril Other (See Comments)    headache  . Ramipril Other (See Comments)    headache  . Spironolactone Other (See Comments)    High potassium  . Bactrim [Sulfamethoxazole-Trimethoprim] Rash    Family History  Problem Relation Age of Onset  . Cancer Brother     prostate, tx w/xrt  . Diabetes Other     1st degree relative  . Hypertension Other     Prior to Admission medications   Medication Sig Start Date End Date Taking? Authorizing Provider  aspirin 81 MG EC tablet Take 81 mg by mouth  daily.     Yes Historical Provider, MD  betamethasone dipropionate (DIPROLENE) 0.05 % cream Apply 1 application topically 2 (two) times daily as needed (dermatitis). Reported on 05/27/2015 11/04/14  Yes Historical Provider, MD  Cholecalciferol (VITAMIN D3) 2000 units capsule Take 1 capsule (2,000 Units total) by mouth daily. 04/23/15  Yes Kurt Sexton V, MD  ferrous sulfate 325 (65 FE) MG tablet Take 1 tablet (325 mg total) by mouth 2 (two) times daily with a meal. Patient taking differently: Take 325 mg by mouth daily.  04/23/15  Yes Kurt Sexton V, MD  hydrOXYzine (ATARAX/VISTARIL) 25 MG tablet Take 1 tablet (25 mg total) by mouth every 8 (eight) hours as needed for itching or nausea. 07/10/14  Yes Kurt Sexton V, MD  labetalol (NORMODYNE) 200 MG tablet TAKE 1 TABLET BY MOUTH 3 TIMES DAILY 12/22/15  Yes Kurt Sexton V, MD  meclizine (ANTIVERT) 12.5 MG tablet Take 12.5 mg by mouth every 6 (six) hours as needed for dizziness.   Yes Historical Provider, MD  oxyCODONE (ROXICODONE) 5 MG immediate release tablet Take 1 tablet (5 mg total) by mouth every 4 (four) hours as needed for severe pain. 12/31/15  Yes [name], DO  tiZANidine (ZANAFLEX) 2 MG tablet Take 1 tablet (2 mg total) by mouth at bedtime as needed for muscle spasms. 07/24/15  Yes Kurt K Patel, DO  torsemide (DEMADEX) 20 MG tablet Take 1 tablet (20 mg total) by mouth daily. Patient taking differently: Take 20 mg by mouth daily as needed (fluid).  04/23/15  Yes Kurt Sexton V, MD  triamcinolone ointment (KENALOG) 0.1 % apply to affected area twice a day if needed for SKIN IRRITATION 02/23/16  Yes [name], MD    Physical Exam: Constitutional: NAD, calm, comfortable Vitals:   04/22/16 2215 04/22/16 2230 04/22/16 2330 04/23/16 0000  BP: 185/81 180/82 160/67 173/77  Pulse: 83 86 89 92  Resp: 19  20 19   SpO2: 100% 98% 97% 97%  Weight:      Height:       Eyes: PERRL, lids and conjunctivae normal ENMT: Mucous  membranes are moist. Posterior pharynx clear of any exudate or lesions.Normal dentition.  Neck: normal, supple, no masses, no thyromegaly Respiratory: clear to auscultation bilaterally, no wheezing, no crackles. Normal respiratory effort. No accessory muscle use.  Cardiovascular: Regular rate and rhythm, no murmurs / rubs / gallops.Trace lower extremity edema. 2+ pedal pulses. No carotid bruits.  Abdomen: no tenderness, no masses palpated. No hepatosplenomegaly. Bowel sounds positive.  Musculoskeletal: no clubbing / cyanosis. No joint deformity upper and lower extremities. Good ROM, no contractures. Normal muscle tone.  Skin: Dermatitis of the bilateral lower extremities. No induration Neurologic: CN 2-12 grossly intact. Sensation intact, DTR normal. Strength 5/5 in all 4.  Psychiatric: Normal judgment and insight. Alert and  oriented x 3. Normal mood.     Labs on Admission: I have personally reviewed following labs and imaging studies  CBC:  Recent Labs Lab 04/22/16 2155  WBC 8.1  NEUTROABS 4.8  HGB 10.8*  HCT 34.3*  MCV 83.9  PLT [id number]   Basic Metabolic Panel:  Recent Labs Lab 04/22/16 2155  NA 139  K 4.1  CL 106  CO2 24  GLUCOSE 100*  BUN 20  CREATININE 1.37*  CALCIUM 8.9   GFR: Estimated Creatinine Clearance: 57.1 mL/min (by C-G formula based on SCr of 1.37 mg/dL (H)). Liver Function Tests:  Recent Labs Lab 04/22/16 2155  AST 23  ALT 20  ALKPHOS 96  BILITOT 0.7  PROT 6.8  ALBUMIN 3.6   No results for input(s): LIPASE, AMYLASE in the last 168 hours. No results for input(s): AMMONIA in the last 168 hours. Coagulation Profile: No results for input(s): INR, PROTIME in the last 168 hours. Cardiac Enzymes: No results for input(s): CKTOTAL, CKMB, CKMBINDEX, TROPONINI in the last 168 hours. BNP (last 3 results)  Recent Labs  12/22/15 1425  PROBNP 51.0   HbA1C: No results for input(s): HGBA1C in the last 72 hours. CBG: No results for input(s): GLUCAP in  the last 168 hours. Lipid Profile: No results for input(s): CHOL, HDL, LDLCALC, TRIG, CHOLHDL, LDLDIRECT in the last 72 hours. Thyroid Function Tests: No results for input(s): TSH, T4TOTAL, FREET4, T3FREE, THYROIDAB in the last 72 hours. Anemia Panel: No results for input(s): VITAMINB12, FOLATE, FERRITIN, TIBC, IRON, RETICCTPCT in the last 72 hours. Urine analysis:    Component Value Date/Time   COLORURINE YELLOW 06/25/2015 1210   APPEARANCEUR CLEAR 06/25/2015 1210   LABSPEC 1.020 06/25/2015 1210   PHURINE 6.0 06/25/2015 1210   GLUCOSEU NEGATIVE 06/25/2015 1210   HGBUR NEGATIVE 06/25/2015 1210   BILIRUBINUR NEGATIVE 06/25/2015 1210   KETONESUR NEGATIVE 06/25/2015 1210   PROTEINUR 30 (A) 04/16/2015 1225   UROBILINOGEN 0.2 06/25/2015 1210   NITRITE NEGATIVE 06/25/2015 1210   LEUKOCYTESUR NEGATIVE 06/25/2015 1210   Sepsis Labs: No results found for this or any previous visit (from the past 240 hour(s)).   Radiological Exams on Admission: Ct Head Wo Contrast  Result Date: 04/22/2016 CLINICAL DATA:  Possible seizure. EXAM: CT HEAD WITHOUT CONTRAST TECHNIQUE: Contiguous axial images were obtained from the base of the skull through the vertex without intravenous contrast. COMPARISON:  Head CT dated 12/30/2015. FINDINGS: Brain: Mild generalized age related parenchymal atrophy with commensurate dilatation of the ventricles and sulci. Mild chronic small vessel ischemic changes within the bilateral periventricular and subcortical white matter regions. There is no mass, hemorrhage, edema or other evidence of acute parenchymal abnormality. No extra-axial hemorrhage. Vascular: There are chronic calcified atherosclerotic changes of the large vessels at the skull base. No unexpected hyperdense vessel. Skull: Normal. Negative for fracture or focal lesion. Sinuses/Orbits: No acute finding. Other: None. IMPRESSION: 1. No acute findings.  No intracranial mass, hemorrhage or edema. 2. Mild chronic small  vessel ischemic changes in the white matter. Electronically Signed   By: Franki Cabot M.D.   On: 04/22/2016 21:49    EKG: Independently reviewed. Sinus rhythm  Assessment/Plan Seizure vs convulsive syncope: Acute. Review of records patient has had multiple evaluations for syncope previously episodes noted to possibly have been caused by micturition. It appears during tonight's episode patient was noted to have been eating. Neurology consult and wanted further workup for possibility of seizure. - Admit to a telemetry bed - seizure orderset initiated with  seizure precautions - Check MRI of the brain and EEG - Appreciate neuro consultative services, will follow for further recommendation. - Follow-up telemetry overnight  Anemia: Stable. Hemoglobin 10.8 which appears near patient's baseline. - Continue ferrous sulfate - Repeat CBC in a.m.  Essential hypertension:  - Continue labetalol  Chronic kidney disease stage III: Stable. - continue to monitor   DVT prophylaxis: Lovenox Code Status: Full Family Communication: No family present at bedside Disposition Plan: Likely discharge home once stable  Consults called: Neurology Admission status: Observation  Norval Morton MD [facility] Pager [phone]  If 7PM-7AM, please contact night-coverage [email or web address] Password [facility]  04/23/2016, 12:59 AM

## 2016-04-23 NOTE — Consult Note (Signed)
NEURO HOSPITALIST CONSULT NOTE   Requestig physician: Dr. Vanita Panda  Reason for Consult: Seizure vs syncope  History obtained from:   Patient and Chart    HPI:                                                                                                                                          Kurt Sexton is an 76 y.o. male who presents after he stiffened and shook at home, followed by unresponsiveness. He was thought possibly to be postictal by [employer]. He states that since 2016, he has had 3-4 additional spells with LOC, one associated with rib fractures. He thinks that these may have been seizures.   Semiology of his most recent spell is documented in primary team H and P: "He states that he was feeling fine all day and sat down at the bar to eat dinner and was sitting on a stool. He then felt flush and slightly nauseous. The wife states that he then had an episode where his head went back and slightly deviated to the right, his back stiffened and his arms stiffened and extension and shook slightly. His eyes was closed and his mouth was open and he was unresponsive however he was was breathing on his own. She states that this episode lasted approximately 5-7 minutes. When the fire department first responders showed up they told EMS that he was alert however confused. He had urinated on himself. By the time EMS arrived he was alert and oriented 3 and complaining of mild abdominal pain and nausea. He has no recollection of the event and at this time he denies any pain and says that he feels great. He states that he has had 3 or 4 syncopal episodes in the past year and a half and has had multiple workups for syncope however he denies any history of seizures."  His PMHx includes HTN, prior stroke, CHF and prostate CA.   Past Medical History:  Diagnosis Date  . BPH (benign prostatic hyperplasia)    Dr [name]  . CHF (congestive heart failure) ([place])   . CVA (cerebral  vascular accident) ([place]) 1998   denies residual on 04/17/2015  . High cholesterol   . History of gout   . HTN (hypertension)   . LBP (low back pain) 2010  . Osteoarthritis    "right knee; right hand" (04/17/2015)  . Prostate cancer ([place]) 01/2008   gleason 3+4=7, PSA 4.93, vol 34.18 cc  . Venous insufficiency   . Vitamin D deficiency     Past Surgical History:  Procedure Laterality Date  . ANTERIOR CERVICAL DECOMP/DISCECTOMY FUSION N/A 11/22/2014   Procedure: Cervical Six Corpectomy with graft and plating Cervical Five-Seven;  Surgeon: Erline Levine, MD;  Location: [facility];  Service: Neurosurgery;  Laterality: N/A;  . BACK SURGERY    . CORRECTION HAMMER TOE Left   . INGUINAL HERNIA REPAIR Bilateral   . KNEE ARTHROSCOPY W/ PCL AND LCL  REPAIR & TENDON GRAFT Left   . PROSTATE BIOPSY  01/2008   gleason 7  . PROSTATE BIOPSY  03/27/13   gleason 3+4=7, volume 34.18 cc  . TONSILLECTOMY      Family History  Problem Relation Age of Onset  . Cancer Brother     prostate, tx w/xrt  . Diabetes Kurt     1st degree relative  . Hypertension Kurt    Social History:  reports that he has never smoked. He has never used smokeless tobacco. He reports that he does not drink alcohol or use drugs.  Allergies  Allergen Reactions  . Diazepam Kurt (See Comments)    lethargic  . Amlodipine Besylate Kurt (See Comments)    dizzy  . Cozaar     Severe headache  . Doxazosin Mesylate Kurt (See Comments)    dizzy  . Lisinopril Kurt (See Comments)    headache  . Ramipril Kurt (See Comments)    headache  . Spironolactone Kurt (See Comments)    High potassium  . Bactrim [Sulfamethoxazole-Trimethoprim] Rash    MEDICATIONS:                                                                                                                     Scheduled: . aspirin EC  81 mg Oral Daily  . cholecalciferol  2,000 Units Oral Daily  . enoxaparin (LOVENOX) injection  40 mg Subcutaneous Q24H  .  ferrous sulfate  325 mg Oral Daily  . labetalol  200 mg Oral TID    ROS:                                                                                                                                       Bilateral lower extremity edema, right worse than left. No additional complaints at time of Neurological assessment.   Blood pressure 160/67, pulse 89, resp. rate 20, height 6' 4.5" (1.943 m), weight 104.3 kg (230 lb), SpO2 97 %.  General Examination:  HEENT-  Normocephalic/atraumatic.   Lungs- Respirations unlabored. No gross wheezing.  Extremities- Pretibial edema and dry flaky skin bilaterally, right worse than left.   Neurological Examination Mental Status: Alert, oriented, thought content appropriate.  Speech fluent without evidence of aphasia.  Able to follow all commands without difficulty. Cranial Nerves: II: Visual fields grossly normal, PERRL III,IV, VI: ptosis not present. Exotropia noted; alternately fixates with left or right eye V,VII: smile symmetric, facial temperature sensation normal bilaterally VIII: hearing intact to conversation IX,X: no hypophonia or hoarseness XI: bilateral shoulder shrug symmetric XII: midline tongue extension Motor: Right : Upper extremity   5/5    Left:     Upper extremity   5/5  Lower extremity   5/5     Lower extremity   5/5 Sensory: Temperature and light touch intact in all 4 extremities proximally Deep Tendon Reflexes: Absent patellar reflexes. Trace biceps and brachioradialisreflexes bilaterally.  Cerebellar: No ataxia with FNF bilaterally Gait: Deferred.   Lab Results: Basic Metabolic Panel:  Recent Labs Lab 04/22/16 2155  NA 139  K 4.1  CL 106  CO2 24  GLUCOSE 100*  BUN 20  CREATININE 1.37*  CALCIUM 8.9    Liver Function Tests:  Recent Labs Lab 04/22/16 2155  AST 23  ALT 20  ALKPHOS 96  BILITOT 0.7  PROT 6.8   ALBUMIN 3.6   No results for input(s): LIPASE, AMYLASE in the last 168 hours. No results for input(s): AMMONIA in the last 168 hours.  CBC:  Recent Labs Lab 04/22/16 2155  WBC 8.1  NEUTROABS 4.8  HGB 10.8*  HCT 34.3*  MCV 83.9  PLT 226    Cardiac Enzymes: No results for input(s): CKTOTAL, CKMB, CKMBINDEX, TROPONINI in the last 168 hours.  Lipid Panel: No results for input(s): CHOL, TRIG, HDL, CHOLHDL, VLDL, LDLCALC in the last 168 hours.  CBG: No results for input(s): GLUCAP in the last 168 hours.  Microbiology: Results for orders placed or performed during the hospital encounter of 11/22/14  Surgical pcr screen     Status: Abnormal   Collection Time: 11/22/14  1:47 PM  Result Value Ref Range Status   MRSA, PCR NEGATIVE NEGATIVE Final   Staphylococcus aureus POSITIVE (A) NEGATIVE Final    Comment:        The Xpert SA Assay (FDA approved for NASAL specimens in patients over 8 years of age), is one component of a comprehensive surveillance program.  Test performance has been validated by [facility] for patients greater than or equal to 17 year old. It is not intended to diagnose infection nor to guide or monitor treatment.     Coagulation Studies: No results for input(s): LABPROT, INR in the last 72 hours.  Imaging: Ct Head Wo Contrast  Result Date: 04/22/2016 CLINICAL DATA:  Possible seizure. EXAM: CT HEAD WITHOUT CONTRAST TECHNIQUE: Contiguous axial images were obtained from the base of the skull through the vertex without intravenous contrast. COMPARISON:  Head CT dated 12/30/2015. FINDINGS: Brain: Mild generalized age related parenchymal atrophy with commensurate dilatation of the ventricles and sulci. Mild chronic small vessel ischemic changes within the bilateral periventricular and subcortical white matter regions. There is no mass, hemorrhage, edema or Kurt evidence of acute parenchymal abnormality. No extra-axial hemorrhage. Vascular: There are  chronic calcified atherosclerotic changes of the large vessels at the skull base. No unexpected hyperdense vessel. Skull: Normal. Negative for fracture or focal lesion. Sinuses/Orbits: No acute finding. Kurt: None. IMPRESSION: 1. No acute findings.  No  intracranial mass, hemorrhage or edema. 2. Mild chronic small vessel ischemic changes in the white matter. Electronically Signed   By: Franki Cabot M.D.   On: 04/22/2016 21:49    Assessment: 1. Recent spell. Description of the event provided by family was most consistent with seizure. Unclear if epileptic or not, as a syncopal spell could result in a syncopal convulsion, which is non-epileptic.  2. Possible syncope. DDx includes autonomic insufficency and cardiac arrhythmia. 3. Hypoactive reflexes. DDx includes neuropathy, with possible contribution from hypothyroidism.  4. CT head revealed no acute abnormality. Mild chronic small vessel ischemic changes in the white matter were noted. 5. 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 Recommend follow-up intracranial MRA (without contrast), and also post-contrast MRI would be complementary. 6. Also noted on MRI brain is progression of moderate for age nonspecific cerebral white matter signal changes since 2012, most commonly small vessel disease related.  Recommendations: 1. Magnesium level.  2. MRA brain to evaluate possible vascular malformation seen on MRI brain. Also perform repeat MRI brain with contrast to further assess the finding.  3. Telemetry to assess for possible arrhythmia. 4. Recent echocardiogram from November showed no aortic stenosis.  5. Tilt table testing and orthostatics.  6. EEG.   Electronically signed: Dr. Kerney Elbe 04/23/2016, 12:14 AM

## 2016-04-23 NOTE — Consult Note (Addendum)
[facility] Nurse wound consult note Reason for Consult: Consult requested for bilat legs.  Wound type: No open wounds or drainage; both legs very dry and scaly with loose peeling skin. Dressing procedure/placement/frequency: Eucerin cream to promote moisture to bilat legs.  Discussed plan of care with patient and he verbalized understanding. Please re-consult if further assistance is needed.  Thank-you,  Julien Girt MSN, [place], [place], [place], [place]

## 2016-04-23 NOTE — ED Notes (Signed)
Delay in lab draw pt using urinal at this time.

## 2016-04-23 NOTE — Progress Notes (Signed)
EEG completed; results pending.    

## 2016-04-23 NOTE — ED Notes (Signed)
Attempted report x1. 

## 2016-04-23 NOTE — Procedures (Signed)
ELECTROENCEPHALOGRAM REPORT  Date of Study: 04/23/2016  Patient's Name: Kurt Sexton MRN: [id number] Date of Birth: 05/30/1940  Referring Provider: Eustaquio Boyden A. Tamala Julian, MD  Clinical History: 76 year old man with seizure-like activity  Medications: Tylenol Aspirin Labetolol Zofran Ativan  Technical Summary: A multichannel digital EEG recording measured by the international 10-20 system with electrodes applied with paste and impedances below 5000 ohms performed in our laboratory with EKG monitoring in an awake and asleep patient.  Hyperventilation was not performed.  Photic stimulation was performed.  The digital EEG was referentially recorded, reformatted, and digitally filtered in a variety of bipolar and referential montages for optimal display.    Description: The patient is awake and asleep during the recording.  During maximal wakefulness, there is a symmetric, medium voltage 8 Hz posterior dominant rhythm that attenuates with eye opening.  The record is symmetric.  During drowsiness and sleep, there is an increase in theta slowing of the background.  Vertex waves and symmetric sleep spindles were seen.  Photic stimulation did not elicit any abnormalities.  There were no epileptiform discharges or electrographic seizures seen.    EKG lead was unremarkable.  Impression: This awake and asleep EEG is normal.    Clinical Correlation: A normal EEG does not exclude a clinical diagnosis of epilepsy.  If further clinical questions remain, prolonged EEG may be helpful.  Clinical correlation is advised.   Metta Clines, DO

## 2016-04-23 NOTE — Progress Notes (Signed)
Subjective: No current complaints. As stated patient again states that he's had 3-4 spells very much like the spell he had today over the last 3 years. However he has never been diagnosed with seizures..   Exam: Vitals:   04/23/16 0000 04/23/16 0118  BP: 173/77 (!) 180/78  Pulse: 92 97  Resp: 19 18  Temp:  98.8 F (37.1 C)     Extremities- no edema Lymph-no adenopathy palpable Musculoskeletal-no joint tenderness, deformity or swelling Skin-warm and dry, no hyperpigmentation, vitiligo, or suspicious lesions    Gen: In bed, NAD MS: Patient is awake and alert and able to follow all commands. He is aware that he is in the hospital and has recollection of the previous events however does not recall the event that brought him into the hospital. As he had passed out. 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 With cover uncover it does correct itself. He does fixate with his left eye when both eyes are open however if the left eye is covered he fixates with his right eye. 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   Pertinent Labs/Diagnostics: Pending diagnostic tests include: EEG, MRA of brain, magnesium, tilt table testing. Orthostatic vital signs  Etta Quill PA-C [facility] [phone]  Impression:  76 year old male with recurrent episodes of syncope with question of some shaking with the episode. With the extra information that he took extra doses of his blood pressure medicine, as well as the information that he had a very low blood pressure just before he had this event, as well as lack of postictal state all point towards this being syncope rather than seizure. At this time I would favor not starting antiepileptics, if the events continue then ambulatory  EEG monitoring could be considered.  Recommendations: No further recommendations at this time. Please call with any further questions or concerns.   Roland Rack, MD [facility] [phone]  If 7pm- 7am, please page neurology on call as listed in [place].  04/23/2016, 9:08 AM

## 2016-04-23 NOTE — Care Management Obs Status (Signed)
[facility] NOTIFICATION   Patient Details  Name: Kurt Sexton MRN: [id number] Date of Birth: [birth year]   Medicare Observation Status Notification Given:  Yes    Pollie Friar, RN 04/23/2016, 4:10 PM

## 2016-04-23 NOTE — ED Notes (Signed)
Attempted report x 2 

## 2016-04-23 NOTE — Progress Notes (Signed)
PROGRESS NOTE    Kurt Sexton  [id number] DOB: 1940/06/18 DOA: 04/22/2016 PCP: [name], MD    Brief Narrative:  76 y.o. 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 Patient reports that he had been in his normal state of health and had been eating dinner at that time of the episode. Patient notes feeling flushed and slightly sick on his stomach. He initially thought symptoms would resolve on their own and that was the last thing he recalls before seeing EMS personnel around him. Per review of records it appears that the patient had became unresponsive, stiffened up, and had some shaking of his extremities. Somehow he had not fallen out of the chair that he was sitting in. He was incontinent of urine. Symptoms lasted anywhere from 5-7 minutes and when EMS arrived patient was noted to be somewhat confused but quickly became alert and oriented 3 and had been complaining of some epigastric abdominal soreness that self resolved as the patient was in route to the hospital. Patient notes that he's had at least 5 episodes of syncope over the last 2 years. He reports one episode following micturition for which she was evaluated by neurology. One other episode happened similar to his presentation today. Patient has no previous history of seizures. Denies any shortness of breath, chest pain, diarrhea, current abdominal pain, or fever.  Assessment & Plan:   Active Problems:   Syncope   Hypertension   Anemia   Seizure (HCC)   Chronic kidney disease, stage 3  Seizure vs convulsive syncope: Acute.  - EEG reviewed. No evidence of seizure - Neurology following - On further questioning, patient admits to taking extra BP meds prior to clinic visit and with home PT given elevated BP related to "white coat syndrome" - Suspect syncope secondary to markedly low BP at time  of presentation - Pt currently stable  Anemia: Stable. Hemoglobin 10.8 which appears near patient's baseline. - Will continue ferrous sulfate - Stable at this time  Essential hypertension:  - Continue labetalol as tolerated  Chronic kidney disease stage III: Stable. - stable at present  DVT prophylaxis: Lovenox subq Code Status: Full Family Communication: Pt in room, family not at bedside Disposition Plan: Uncertain at this time  Consultants:   Neurology  Procedures:     Antimicrobials: Anti-infectives    None       Subjective: No complaints. Feels better today  Objective: Vitals:   04/23/16 0118 04/23/16 0917 04/23/16 1348 04/23/16 1730  BP: (!) 180/78 126/73 (!) 176/68 (!) 184/83  Pulse: 97 79 67 72  Resp: 18  18   Temp: 98.8 F (37.1 C) 98.1 F (36.7 C) 97.6 F (36.4 C)   TempSrc: Oral Oral Oral   SpO2: 98% 100%    Weight:      Height:        Intake/Output Summary (Last 24 hours) at 04/23/16 1742 Last data filed at 04/23/16 1300  Gross per 24 hour  Intake              360 ml  Output              200 ml  Net              160 ml   Filed Weights   04/22/16 2044  Weight: 104.3 kg (230 lb)    Examination:  General exam: Appears calm and comfortable  Respiratory system: Clear to auscultation. Respiratory effort normal. Cardiovascular system: S1 & S2 heard, RRR Gastrointestinal system: Abdomen is nondistended, soft and nontender. No organomegaly or masses felt. Normal bowel sounds heard. Central nervous system: Alert and oriented. No focal neurological deficits. Extremities: Symmetric 5 x 5 power. Skin: No rashes, lesions  Psychiatry: Judgement and insight appear normal. Mood & affect appropriate.   Data Reviewed: I have personally reviewed following labs and imaging studies  CBC:  Recent Labs Lab 04/22/16 2155  WBC 8.1  NEUTROABS 4.8  HGB 10.8*  HCT 34.3*  MCV 83.9  PLT [id number]   Basic Metabolic Panel:  Recent Labs Lab  04/22/16 2155 04/23/16 0900  NA 139  --   K 4.1  --   CL 106  --   CO2 24  --   GLUCOSE 100*  --   BUN 20  --   CREATININE 1.37*  --   CALCIUM 8.9  --   MG  --  2.2   GFR: Estimated Creatinine Clearance: 57.1 mL/min (by C-G formula based on SCr of 1.37 mg/dL (H)). Liver Function Tests:  Recent Labs Lab 04/22/16 2155  AST 23  ALT 20  ALKPHOS 96  BILITOT 0.7  PROT 6.8  ALBUMIN 3.6   No results for input(s): LIPASE, AMYLASE in the last 168 hours. No results for input(s): AMMONIA in the last 168 hours. Coagulation Profile: No results for input(s): INR, PROTIME in the last 168 hours. Cardiac Enzymes: No results for input(s): CKTOTAL, CKMB, CKMBINDEX, TROPONINI in the last 168 hours. BNP (last 3 results)  Recent Labs  12/22/15 1425  PROBNP 51.0   HbA1C: No results for input(s): HGBA1C in the last 72 hours. CBG: No results for input(s): GLUCAP in the last 168 hours. Lipid Profile: No results for input(s): CHOL, HDL, LDLCALC, TRIG, CHOLHDL, LDLDIRECT in the last 72 hours. Thyroid Function Tests: No results for input(s): TSH, T4TOTAL, FREET4, T3FREE, THYROIDAB in the last 72 hours. Anemia Panel: No results for input(s): VITAMINB12, FOLATE, FERRITIN, TIBC, IRON, RETICCTPCT in the last 72 hours. Sepsis Labs: No results for input(s): PROCALCITON, LATICACIDVEN in the last 168 hours.  No results found for this or any previous visit (from the past 240 hour(s)).   Radiology Studies: Ct Head Wo Contrast  Result Date: 04/22/2016 CLINICAL DATA:  Possible seizure. EXAM: CT HEAD WITHOUT CONTRAST TECHNIQUE: Contiguous axial images were obtained from the base of the skull through the vertex without intravenous contrast. COMPARISON:  Head CT dated 12/30/2015. FINDINGS: Brain: Mild generalized age related parenchymal atrophy with commensurate dilatation of the ventricles and sulci. Mild chronic small vessel ischemic changes within the bilateral periventricular and subcortical white  matter regions. There is no mass, hemorrhage, edema or other evidence of acute parenchymal abnormality. No extra-axial hemorrhage. Vascular: There are chronic calcified atherosclerotic changes of the large vessels at the skull base. No unexpected hyperdense vessel. Skull: Normal. Negative for fracture or focal lesion. Sinuses/Orbits: No acute finding. Other: None. IMPRESSION: 1. No acute findings.  No intracranial mass, hemorrhage or edema. 2. Mild chronic small vessel ischemic changes in the white matter. Electronically Signed   By: Franki Cabot M.D.   On: 04/22/2016 21:49   Mr Jodene Nam Head Wo Contrast  Result Date: 04/23/2016 CLINICAL DATA:  Syncope versus seizure. Abnormal MRI. Possible vascular malformation. EXAM: MRA HEAD WITHOUT CONTRAST TECHNIQUE: Angiographic images of the Circle of Willis were obtained using MRA technique without intravenous contrast. COMPARISON:  MRI head  04/23/2016 FINDINGS: There is artifact from a metallic foreign body in the right jaw. There also is an artifact across the skullbase which appears to interfere with signal in the basilar artery. For this reason, CTA is suggested for further evaluation. Both vertebral arteries appear patent to the basilar. Poor visualization of both distal vertebral arteries. Signal loss in the mid basilar most likely is artifactual. This vessel appears normal on MRI. Superior cerebellar and posterior cerebral arteries are patent and appear normal. The small tangle vessels in the superior cerebellar cistern is best seen on the source images which were repeated and this abnormality seen on both sequences. 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 No aneurysm in the area. Internal carotid artery patent bilaterally. Anterior and middle cerebral arteries are normal bilaterally. No aneurysm in the anterior circulation IMPRESSION: This study is degraded by artifact. There is a metal foreign body in the right face causing artifact. In addition,  there is artifact through the brainstem and basilar artery interfering with signal in the basilar artery. 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 Further evaluation with CT angiogram of the head is suggested. Electronically Signed   By: Franchot Gallo M.D.   On: 04/23/2016 12:33   Mr Brain Wo Contrast  Result Date: 04/23/2016 CLINICAL DATA:  76 year old male presenting with syncopal episode versus seizure episode. Initial encounter. Known jaw region retained metallic foreign body. EXAM: MRI HEAD WITHOUT CONTRAST TECHNIQUE: Multiplanar, multiecho pulse sequences of the brain and surrounding structures were obtained without intravenous contrast. COMPARISON:  Head CTs without contrast 04/22/2016 and earlier. Brain MRI 10/26/2010. FINDINGS: Brain: Chronic metal susceptibility artifact about the right face limits visualization of the anterior right hemisphere and anterior brainstem, especially on diffusion and T2* imaging. No restricted diffusion to suggest acute infarction. No midline shift, mass effect, evidence of mass lesion, ventriculomegaly, extra-axial collection or acute intracranial hemorrhage. Cervicomedullary junction and pituitary are within normal limits. There is a 15 mm area of encephalomalacia along the superior right cerebellum near midline best seen on coronal diffusion series 600, image 11). This is in the same area where hemosiderin was suspected along the right tentorial incisura in 2012. Today the T2* and T2 weighted imaging appearance more suggests a tangle of small vessels about the straight sinus in this area (series 4, image 11, series 9, image 8 and series 7, image 11). No cerebellar edema. No other cerebellar or brainstem signal changes. Chronic patchy mostly periventricular white matter T2 and FLAIR hyperintensity is re- demonstrated with mild progression since 2012. Extent is moderate for age. No cerebral cortical encephalomalacia  or definite chronic cerebral blood products. Deep gray matter nuclei remain normal for age. The hippocampal formations appear stable since 2012 and within normal limits. Vascular: Major intracranial vascular flow voids are preserved and appear stable since 2012. Skull and upper cervical spine: Negative. Normal bone marrow signal. Sinuses/Orbits: Visible orbits soft tissues and paranasal sinuses appear normal. Other: Visible internal auditory structures appear normal. Mastoid air cells are stable and well pneumatized. Stable and negative visible scalp soft tissues. IMPRESSION: 1. 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 Recommend follow-up intracranial MRA (without contrast), and also post-contrast MRI would be complementary. 2. However, no acute intracranial abnormality is identified. Visualization of some areas of the brain is once again limited by chronic jaw region retained metallic foreign body.  3. Progression of moderate for age nonspecific cerebral white matter signal changes since 2012, most commonly small vessel disease related. Electronically Signed   By: Genevie Ann M.D.   On: 04/23/2016 06:46    Scheduled Meds: . aspirin EC  81 mg Oral Daily  . cholecalciferol  2,000 Units Oral Daily  . enoxaparin (LOVENOX) injection  40 mg Subcutaneous Q24H  . ferrous sulfate  325 mg Oral Daily  . hydrocerin   Topical Daily  . labetalol  200 mg Oral TID   Continuous Infusions:   LOS: 0 days   Haneefah Venturini, Orpah Melter, MD [facility] Pager [phone]  If 7PM-7AM, please contact night-coverage [email or web address] Password [facility] 04/23/2016, 5:42 PM

## 2016-04-23 NOTE — Care Management Note (Signed)
Case Management Note  Patient Details  Name: JAMOL REIM MRN: [id number] Date of Birth: Feb 13, 1941  Subjective/Objective:            Patient presented with seizure-like activity. Admitted from home via EMS.  CM will follow for discharge needs pending patient's progress and physician orders.         Action/Plan:   Expected Discharge Date:                  Expected Discharge Plan:     In-House Referral:     Discharge planning Services     Post Acute Care Choice:    Choice offered to:     DME Arranged:    DME Agency:     HH Arranged:    HH Agency:     Status of Service:     If discussed at [employer] of Stay Meetings, dates discussed:    Additional Comments:  Rolm Baptise, RN 04/23/2016, 12:17 PM

## 2016-04-24 DIAGNOSIS — I952 Hypotension due to drugs: Secondary | ICD-10-CM | POA: Diagnosis not present

## 2016-04-24 MED ORDER — TORSEMIDE 20 MG PO TABS
20.0000 mg | ORAL_TABLET | Freq: Every day | ORAL | Status: DC
  Administered 2016-04-24: 20 mg via ORAL
  Filled 2016-04-24: qty 1

## 2016-04-24 NOTE — Progress Notes (Signed)
Pt. Got d/c papers,IV was removed,tele was d/c.Pt. Ready to go home with his wife.

## 2016-04-24 NOTE — Progress Notes (Signed)
Patient has small blisters on right and left hands, multiple open and bleeding. Patient hands cleansed. Patient has BLE +2 pitting edema with excessive dry flaky skin, eucerin cream applied. Report given to oncoming RN.

## 2016-04-24 NOTE — Discharge Summary (Signed)
Physician Discharge Summary  Kurt Sexton [id number] DOB: 04/24/1940 DOA: 04/22/2016  PCP: [name], MD  Admit date: 04/22/2016 Discharge date: 04/24/2016  Admitted From: Home Disposition:  Home  Recommendations for Outpatient Follow-up:  1. Follow up with PCP in 1-2 weeks 2. Please obtain BMP/CBC in one week 3. Please monitor blood pressures closely, titrate bp meds accordingly  Discharge Condition:Stable CODE STATUS:Full Diet recommendation: Heart healthy   Brief/Interim Summary: 76 y.o.malewith medical history significant of HTN, HLD, dCHFlast EF 55-60% with grade 2 dFxin 01/2016, BPH, and syncope; who presents after having a episode of loss of consciousness. Patient reports that he had been in his normal state of health and had been eating dinner at that time of the episode. Patient notes feeling flushed and slightly sick on his stomach. Heinitially thought symptoms would resolve on their own and that was the last thing he recalls before seeing EMS personnel around him. Per review of records it appears that the patient had became unresponsive, stiffened up, and had some shaking of his extremities. Somehow he had not fallen out of the chair that he was sitting in. He was incontinent of urine. Symptoms lasted anywhere from 5-7 minutes and when EMS arrived patient was noted to be somewhat confused but quickly became alert and oriented 3 and had been complaining of some epigastric abdominal soreness that self resolved as the patient was in route to the hospital. Patient notes that he's had at least 5 episodes of syncope over the last 2 years. He reports one episode following micturition for which she was evaluated by neurology. One other episode happened similar to his presentation today. Patient has no previous history of seizures. Denies any shortness of breath, chest pain, diarrhea, current abdominal pain, or fever.  Syncope secondary to hypotension from overmedication.  - EEG  reviewed. No evidence of seizure - Neurology had been following - On further questioning, patient admits to taking extra BP meds prior to clinic visit and with home PT given elevated BP related to "white coat syndrome" - Suspect syncope secondary to markedly low BP at time of presentation - Remained stable - Neurology signed off. Pt advised to avoid over-medicating self  Anemia:Stable. Hemoglobin 10.8 which appears near patient's baseline. - Will continue ferrous sulfate - Stable at this time  Essential hypertension:  - Continued labetalol as tolerated  Chronic kidney disease stage III: Stable. - stable at present  Discharge Diagnoses:  Principal Problem:   Hypotension due to medication Active Problems:   Syncope   Hypertension   Anemia   Chronic kidney disease, stage 3    Discharge Instructions   Allergies as of 04/24/2016      Reactions   Diazepam Other (See Comments)   lethargic   Amlodipine Besylate Other (See Comments)   dizzy   Cozaar    Severe headache   Doxazosin Mesylate Other (See Comments)   dizzy   Lisinopril Other (See Comments)   headache   Ramipril Other (See Comments)   headache   Spironolactone Other (See Comments)   High potassium   Bactrim [sulfamethoxazole-trimethoprim] Rash      Medication List    TAKE these medications   aspirin 81 MG EC tablet Take 81 mg by mouth daily.   betamethasone dipropionate 0.05 % cream Commonly known as:  DIPROLENE Apply 1 application topically 2 (two) times daily as needed (dermatitis). Reported on 05/27/2015   ferrous sulfate 325 (65 FE) MG tablet Take 1 tablet (325 mg total) by mouth  2 (two) times daily with a meal. What changed:  when to take this   hydrOXYzine 25 MG tablet Commonly known as:  ATARAX/VISTARIL Take 1 tablet (25 mg total) by mouth every 8 (eight) hours as needed for itching or nausea.   labetalol 200 MG tablet Commonly known as:  NORMODYNE TAKE 1 TABLET BY MOUTH 3 TIMES DAILY    meclizine 12.5 MG tablet Commonly known as:  ANTIVERT Take 12.5 mg by mouth every 6 (six) hours as needed for dizziness.   oxyCODONE 5 MG immediate release tablet Commonly known as:  ROXICODONE Take 1 tablet (5 mg total) by mouth every 4 (four) hours as needed for severe pain.   tiZANidine 2 MG tablet Commonly known as:  ZANAFLEX Take 1 tablet (2 mg total) by mouth at bedtime as needed for muscle spasms.   torsemide 20 MG tablet Commonly known as:  DEMADEX Take 1 tablet (20 mg total) by mouth daily. What changed:  when to take this  reasons to take this   triamcinolone ointment 0.1 % Commonly known as:  KENALOG apply to affected area twice a day if needed for SKIN IRRITATION   Vitamin D3 2000 units capsule Take 1 capsule (2,000 Units total) by mouth daily.      Follow-up Information    [name], MD. Schedule an appointment as soon as possible for a visit in 1 week(s).   Specialty:  Internal Medicine Contact information: [place] [address] [phone]          Allergies  Allergen Reactions  . Diazepam Other (See Comments)    lethargic  . Amlodipine Besylate Other (See Comments)    dizzy  . Cozaar     Severe headache  . Doxazosin Mesylate Other (See Comments)    dizzy  . Lisinopril Other (See Comments)    headache  . Ramipril Other (See Comments)    headache  . Spironolactone Other (See Comments)    High potassium  . Bactrim [Sulfamethoxazole-Trimethoprim] Rash    Consultations:  Neurology  Procedures/Studies: Ct Head Wo Contrast  Result Date: 04/22/2016 CLINICAL DATA:  Possible seizure. EXAM: CT HEAD WITHOUT CONTRAST TECHNIQUE: Contiguous axial images were obtained from the base of the skull through the vertex without intravenous contrast. COMPARISON:  Head CT dated 12/30/2015. FINDINGS: Brain: Mild generalized age related parenchymal atrophy with commensurate dilatation of the ventricles and sulci. Mild chronic small vessel  ischemic changes within the bilateral periventricular and subcortical white matter regions. There is no mass, hemorrhage, edema or other evidence of acute parenchymal abnormality. No extra-axial hemorrhage. Vascular: There are chronic calcified atherosclerotic changes of the large vessels at the skull base. No unexpected hyperdense vessel. Skull: Normal. Negative for fracture or focal lesion. Sinuses/Orbits: No acute finding. Other: None. IMPRESSION: 1. No acute findings.  No intracranial mass, hemorrhage or edema. 2. Mild chronic small vessel ischemic changes in the white matter. Electronically Signed   By: Franki Cabot M.D.   On: 04/22/2016 21:49   Mr Jodene Nam Head Wo Contrast  Result Date: 04/23/2016 CLINICAL DATA:  Syncope versus seizure. Abnormal MRI. Possible vascular malformation. EXAM: MRA HEAD WITHOUT CONTRAST TECHNIQUE: Angiographic images of the Circle of Willis were obtained using MRA technique without intravenous contrast. COMPARISON:  MRI head 04/23/2016 FINDINGS: There is artifact from a metallic foreign body in the right jaw. There also is an artifact across the skullbase which appears to interfere with signal in the basilar artery. For this reason, CTA is suggested for  further evaluation. Both vertebral arteries appear patent to the basilar. Poor visualization of both distal vertebral arteries. Signal loss in the mid basilar most likely is artifactual. This vessel appears normal on MRI. Superior cerebellar and posterior cerebral arteries are patent and appear normal. The small tangle vessels in the superior cerebellar cistern is best seen on the source images which were repeated and this abnormality seen on both sequences. 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 No aneurysm in the area. Internal carotid artery patent bilaterally. Anterior and middle cerebral arteries are normal bilaterally. No aneurysm in the anterior circulation IMPRESSION: This study is degraded by artifact. There  is a metal foreign body in the right face causing artifact. In addition, there is artifact through the brainstem and basilar artery interfering with signal in the basilar artery. 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 Further evaluation with CT angiogram of the head is suggested. Electronically Signed   By: Franchot Gallo M.D.   On: 04/23/2016 12:33   Mr Brain Wo Contrast  Result Date: 04/23/2016 CLINICAL DATA:  76 year old male presenting with syncopal episode versus seizure episode. Initial encounter. Known jaw region retained metallic foreign body. EXAM: MRI HEAD WITHOUT CONTRAST TECHNIQUE: Multiplanar, multiecho pulse sequences of the brain and surrounding structures were obtained without intravenous contrast. COMPARISON:  Head CTs without contrast 04/22/2016 and earlier. Brain MRI 10/26/2010. FINDINGS: Brain: Chronic metal susceptibility artifact about the right face limits visualization of the anterior right hemisphere and anterior brainstem, especially on diffusion and T2* imaging. No restricted diffusion to suggest acute infarction. No midline shift, mass effect, evidence of mass lesion, ventriculomegaly, extra-axial collection or acute intracranial hemorrhage. Cervicomedullary junction and pituitary are within normal limits. There is a 15 mm area of encephalomalacia along the superior right cerebellum near midline best seen on coronal diffusion series 600, image 11). This is in the same area where hemosiderin was suspected along the right tentorial incisura in 2012. Today the T2* and T2 weighted imaging appearance more suggests a tangle of small vessels about the straight sinus in this area (series 4, image 11, series 9, image 8 and series 7, image 11). No cerebellar edema. No other cerebellar or brainstem signal changes. Chronic patchy mostly periventricular white matter T2 and FLAIR hyperintensity is re- demonstrated with mild progression  since 2012. Extent is moderate for age. No cerebral cortical encephalomalacia or definite chronic cerebral blood products. Deep gray matter nuclei remain normal for age. The hippocampal formations appear stable since 2012 and within normal limits. Vascular: Major intracranial vascular flow voids are preserved and appear stable since 2012. Skull and upper cervical spine: Negative. Normal bone marrow signal. Sinuses/Orbits: Visible orbits soft tissues and paranasal sinuses appear normal. Other: Visible internal auditory structures appear normal. Mastoid air cells are stable and well pneumatized. Stable and negative visible scalp soft tissues. IMPRESSION: 1. 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 Recommend follow-up intracranial MRA (without contrast), and also post-contrast MRI would be complementary. 2. However, no acute intracranial abnormality is identified. Visualization of some areas of the brain is once again limited by chronic jaw region retained metallic foreign body. 3. Progression of moderate for age nonspecific cerebral white matter signal changes since 2012, most commonly small vessel disease related. Electronically Signed   By: Genevie Ann M.D.   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  Discharge Exam: Vitals:   04/24/16 0553 04/24/16 0916  BP: (!) 159/70 (!) 162/65  Pulse: 68 70  Resp: 20 20  Temp: 97.9 F (36.6 C) 98 F (36.7 C)   Vitals:   04/23/16 2149 04/24/16 0237 04/24/16 0553 04/24/16 0916  BP: (!) 158/71 (!) 158/65 (!) 159/70 (!) 162/65  Pulse: 66 68 68 70  Resp: 20 20 20 20   Temp: 98.3 F (36.8 C) 98.8 F (37.1 C) 97.9 F (36.6 C) 98 F (36.7 C)  TempSrc: Oral Oral Oral Oral  SpO2: 100% 94% 95% 99%  Weight:      Height:        General: Pt is alert, awake, not in acute distress Cardiovascular: RRR, S1/S2 +, no rubs, no  gallops Respiratory: CTA bilaterally, no wheezing, no rhonchi Abdominal: Soft, NT, ND, bowel sounds + Extremities: no edema, no cyanosis   The results of significant diagnostics from this hospitalization (including imaging, microbiology, ancillary and laboratory) are listed below for reference.     Microbiology: No results found for this or any previous visit (from the past 240 hour(s)).   Labs: BNP (last 3 results) No results for input(s): BNP in the last 8760 hours. Basic Metabolic Panel:  Recent Labs Lab 04/22/16 2155 04/23/16 0900  NA 139  --   K 4.1  --   CL 106  --   CO2 24  --   GLUCOSE 100*  --   BUN 20  --   CREATININE 1.37*  --   CALCIUM 8.9  --   MG  --  2.2   Liver Function Tests:  Recent Labs Lab 04/22/16 2155  AST 23  ALT 20  ALKPHOS 96  BILITOT 0.7  PROT 6.8  ALBUMIN 3.6   No results for input(s): LIPASE, AMYLASE in the last 168 hours. No results for input(s): AMMONIA in the last 168 hours. CBC:  Recent Labs Lab 04/22/16 2155  WBC 8.1  NEUTROABS 4.8  HGB 10.8*  HCT 34.3*  MCV 83.9  PLT 226   Cardiac Enzymes: No results for input(s): CKTOTAL, CKMB, CKMBINDEX, TROPONINI in the last 168 hours. BNP: Invalid input(s): POCBNP CBG: No results for input(s): GLUCAP in the last 168 hours. D-Dimer No results for input(s): DDIMER in the last 72 hours. Hgb A1c No results for input(s): HGBA1C in the last 72 hours. Lipid Profile No results for input(s): CHOL, HDL, LDLCALC, TRIG, CHOLHDL, LDLDIRECT in the last 72 hours. Thyroid function studies No results for input(s): TSH, T4TOTAL, T3FREE, THYROIDAB in the last 72 hours.  Invalid input(s): FREET3 Anemia work up No results for input(s): VITAMINB12, FOLATE, FERRITIN, TIBC, IRON, RETICCTPCT in the last 72 hours. Urinalysis    Component Value Date/Time   COLORURINE YELLOW 06/25/2015 1210   APPEARANCEUR CLEAR 06/25/2015 1210   LABSPEC 1.020 06/25/2015 1210   PHURINE 6.0 06/25/2015 1210    GLUCOSEU NEGATIVE 06/25/2015 1210   HGBUR NEGATIVE 06/25/2015 1210   BILIRUBINUR NEGATIVE 06/25/2015 1210   KETONESUR NEGATIVE 06/25/2015 1210   PROTEINUR 30 (A) 04/16/2015 1225   UROBILINOGEN 0.2 06/25/2015 1210   NITRITE NEGATIVE 06/25/2015 1210   LEUKOCYTESUR NEGATIVE 06/25/2015 1210   Sepsis Labs Invalid input(s): PROCALCITONIN,  WBC,  LACTICIDVEN Microbiology No results found for this or any previous visit (from the past 240 hour(s)).   SIGNED:   Donne Hazel, MD  [facility] 04/24/2016, 4:01 PM  If 7PM-7AM, please contact night-coverage [email or web address] Password [email or web address]

## 2016-05-04 ENCOUNTER — Ambulatory Visit (INDEPENDENT_AMBULATORY_CARE_PROVIDER_SITE_OTHER): Payer: Medicare Other | Admitting: Sports Medicine

## 2016-05-04 ENCOUNTER — Encounter: Payer: Self-pay | Admitting: Sports Medicine

## 2016-05-04 DIAGNOSIS — M79674 Pain in right toe(s): Secondary | ICD-10-CM

## 2016-05-04 DIAGNOSIS — B351 Tinea unguium: Secondary | ICD-10-CM

## 2016-05-04 DIAGNOSIS — M79675 Pain in left toe(s): Secondary | ICD-10-CM

## 2016-05-04 DIAGNOSIS — I890 Lymphedema, not elsewhere classified: Secondary | ICD-10-CM
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
## 2016-05-14 ENCOUNTER — Ambulatory Visit (INDEPENDENT_AMBULATORY_CARE_PROVIDER_SITE_OTHER): Payer: Medicare Other | Admitting: Internal Medicine

## 2016-05-14 ENCOUNTER — Encounter: Payer: Self-pay | Admitting: Internal Medicine

## 2016-05-14 ENCOUNTER — Other Ambulatory Visit (INDEPENDENT_AMBULATORY_CARE_PROVIDER_SITE_OTHER): Payer: Medicare Other

## 2016-05-14 VITALS — BP 162/72 | HR 60 | Temp 98.1°F | Resp 16 | Ht 77.0 in | Wt 233.8 lb

## 2016-05-14 DIAGNOSIS — M545 Low back pain, unspecified: Secondary | ICD-10-CM

## 2016-05-14 DIAGNOSIS — M1990 Unspecified osteoarthritis, unspecified site: Secondary | ICD-10-CM | POA: Diagnosis not present

## 2016-05-14 DIAGNOSIS — N183 Chronic kidney disease, stage 3 unspecified: Secondary | ICD-10-CM

## 2016-05-14 DIAGNOSIS — R7309 Other abnormal glucose: Secondary | ICD-10-CM

## 2016-05-14 DIAGNOSIS — R55 Syncope and collapse: Secondary | ICD-10-CM | POA: Diagnosis not present

## 2016-05-14 DIAGNOSIS — I10 Essential (primary) hypertension: Secondary | ICD-10-CM

## 2016-05-14 DIAGNOSIS — R569 Unspecified convulsions: Secondary | ICD-10-CM

## 2016-05-14 LAB — CBC WITH DIFFERENTIAL/PLATELET
BASOS PCT: 2.3 % (ref 0.0–3.0)
Basophils Absolute: 0.2 10*3/uL — ABNORMAL HIGH (ref 0.0–0.1)
Eosinophils Absolute: 1.1 10*3/uL — ABNORMAL HIGH (ref 0.0–0.7)
Eosinophils Relative: 16 % — ABNORMAL HIGH (ref 0.0–5.0)
HEMATOCRIT: 34.2 % — AB (ref 39.0–52.0)
Hemoglobin: 11 g/dL — ABNORMAL LOW (ref 13.0–17.0)
LYMPHS PCT: 24.2 % (ref 12.0–46.0)
Lymphs Abs: 1.7 10*3/uL (ref 0.7–4.0)
MCHC: 32.2 g/dL (ref 30.0–36.0)
MCV: 82.4 fl (ref 78.0–100.0)
MONOS PCT: 8.4 % (ref 3.0–12.0)
Monocytes Absolute: 0.6 10*3/uL (ref 0.1–1.0)
NEUTROS ABS: 3.4 10*3/uL (ref 1.4–7.7)
Neutrophils Relative %: 49.1 % (ref 43.0–77.0)
PLATELETS: 265 10*3/uL (ref 150.0–400.0)
RBC: 4.15 Mil/uL — ABNORMAL LOW (ref 4.22–5.81)
RDW: 13.8 % (ref 11.5–15.5)
WBC: 7 10*3/uL (ref 4.0–10.5)

## 2016-05-14 LAB — BASIC METABOLIC PANEL
BUN: 25 mg/dL — ABNORMAL HIGH (ref 6–23)
CHLORIDE: 104 meq/L (ref 96–112)
CO2: 28 meq/L (ref 19–32)
Calcium: 9.4 mg/dL (ref 8.4–10.5)
Creatinine, Ser: 1.31 mg/dL (ref 0.40–1.50)
GFR: 68.29 mL/min (ref >60.00)
GLUCOSE: 107 mg/dL — AB (ref 70–99)
Potassium: 4.9 mEq/L (ref 3.5–5.1)
SODIUM: 138 meq/L (ref 135–145)

## 2016-05-14 LAB — SEDIMENTATION RATE: Sed Rate: 44 mm/hr — ABNORMAL HIGH (ref 0–20)

## 2016-05-14 LAB — HEMOGLOBIN A1C: HEMOGLOBIN A1C: 5.2 % (ref 4.6–6.5)

## 2016-05-14 MED ORDER — METHYLPREDNISOLONE ACETATE 80 MG/ML IJ SUSP
80.0000 mg | Freq: Once | INTRAMUSCULAR | Status: AC
  Administered 2016-05-14: 80 mg via INTRAMUSCULAR

## 2016-05-14 NOTE — Assessment & Plan Note (Signed)
Cont w/Torsemide, Labetalol

## 2016-05-14 NOTE — Assessment & Plan Note (Signed)
Using a cane 

## 2016-05-14 NOTE — Progress Notes (Signed)
Pre-visit discussion using our clinic review tool. No additional management support is needed unless otherwise documented below in the visit note.  

## 2016-05-14 NOTE — Assessment & Plan Note (Addendum)
Recurrent - s/p Neurol eval and cardiology w/up  per Dr Curt Bears --?micturitional

## 2016-05-14 NOTE — Assessment & Plan Note (Signed)
Monitor Labs 

## 2016-05-14 NOTE — Assessment & Plan Note (Signed)
F/u w/Dr Patel ?

## 2016-05-14 NOTE — Progress Notes (Signed)
Subjective:  Patient ID: Kurt Sexton, male    DOB: 1940-11-12  Age: 76 y.o. MRN: [id number]  CC: Follow-up (hypotension, syncope, ) and Hospitalization Follow-up (edema, siezure, )   HPI Kurt Sexton presents for post-hosp f/u - seizure episode... F/u HTN, OA, edema and CHF f/u C/o R hand pain and swelling flare up  Outpatient Medications Prior to Visit  Medication Sig Dispense Refill  . aspirin 81 MG EC tablet Take 81 mg by mouth daily.      . betamethasone dipropionate (DIPROLENE) 0.05 % cream Apply 1 application topically 2 (two) times daily as needed (dermatitis). Reported on 05/27/2015    . Cholecalciferol (VITAMIN D3) 2000 units capsule Take 1 capsule (2,000 Units total) by mouth daily. 100 capsule 3  . ferrous sulfate 325 (65 FE) MG tablet Take 1 tablet (325 mg total) by mouth 2 (two) times daily with a meal. (Patient taking differently: Take 325 mg by mouth daily. ) 100 tablet 3  . hydrOXYzine (ATARAX/VISTARIL) 25 MG tablet Take 1 tablet (25 mg total) by mouth every 8 (eight) hours as needed for itching or nausea. 60 tablet 1  . labetalol (NORMODYNE) 200 MG tablet TAKE 1 TABLET BY MOUTH 3 TIMES DAILY 270 tablet 2  . meclizine (ANTIVERT) 12.5 MG tablet Take 12.5 mg by mouth every 6 (six) hours as needed for dizziness.    Marland Kitchen oxyCODONE (ROXICODONE) 5 MG immediate release tablet Take 1 tablet (5 mg total) by mouth every 4 (four) hours as needed for severe pain. 15 tablet 0  . tiZANidine (ZANAFLEX) 2 MG tablet Take 1 tablet (2 mg total) by mouth at bedtime as needed for muscle spasms. 30 tablet 5  . torsemide (DEMADEX) 20 MG tablet Take 1 tablet (20 mg total) by mouth daily. (Patient taking differently: Take 20 mg by mouth daily as needed (fluid). ) 30 tablet 2  . triamcinolone ointment (KENALOG) 0.1 % apply to affected area twice a day if needed for SKIN IRRITATION 80 g 2   No facility-administered medications prior to visit.     ROS Review of Systems  Constitutional: Negative for  appetite change, fatigue and unexpected weight change.  HENT: Negative for congestion, nosebleeds, sneezing, sore throat and trouble swallowing.   Eyes: Negative for itching and visual disturbance.  Respiratory: Negative for cough.   Cardiovascular: Negative for chest pain, palpitations and leg swelling.  Gastrointestinal: Negative for abdominal distention, blood in stool, diarrhea and nausea.  Genitourinary: Negative for frequency and hematuria.  Musculoskeletal: Positive for arthralgias and joint swelling. Negative for back pain, gait problem and neck pain.  Skin: Positive for rash.  Neurological: Positive for dizziness, syncope and light-headedness. Negative for tremors, speech difficulty and weakness.  Psychiatric/Behavioral: Negative for agitation, dysphoric mood and sleep disturbance. The patient is nervous/anxious.     Objective:  BP (!) 162/72   Pulse 60   Temp 98.1 F (36.7 C) (Oral)   Resp 16   Ht 6\' 5"  (1.956 m)   Wt 233 lb 12 oz (106 kg)   SpO2 95%   BMI 27.72 kg/m   BP Readings from Last 3 Encounters:  05/14/16 (!) 162/72  04/24/16 (!) 162/65  03/09/16 (!) 148/70    Wt Readings from Last 3 Encounters:  05/14/16 233 lb 12 oz (106 kg)  04/22/16 230 lb (104.3 kg)  03/09/16 236 lb 6.4 oz (107.2 kg)    Physical Exam  Constitutional: He is oriented to person, place, and time. He appears well-developed. No  distress.  NAD  HENT:  Mouth/Throat: Oropharynx is clear and moist.  Eyes: Conjunctivae are normal. Pupils are equal, round, and reactive to light.  Neck: Normal range of motion. No JVD present. No thyromegaly present.  Cardiovascular: Normal rate, regular rhythm, normal heart sounds and intact distal pulses.  Exam reveals no gallop and no friction rub.   No murmur heard. Pulmonary/Chest: Effort normal and breath sounds normal. No respiratory distress. He has no wheezes. He has no rales. He exhibits no tenderness.  Abdominal: Soft. Bowel sounds are normal. He  exhibits no distension and no mass. There is no tenderness. There is no rebound and no guarding.  Musculoskeletal: Normal range of motion. He exhibits edema and tenderness.  Lymphadenopathy:    He has no cervical adenopathy.  Neurological: He is alert and oriented to person, place, and time. He has normal reflexes. No cranial nerve deficit. He exhibits normal muscle tone. He displays a negative Romberg sign. Coordination abnormal. Gait normal.  Skin: Skin is warm and dry. Rash noted.  Psychiatric: He has a normal mood and affect. His behavior is normal. Judgment and thought content normal.  Cane R 2nd MCP is tender and swollen  Lab Results  Component Value Date   WBC 8.1 04/22/2016   HGB 10.8 (L) 04/22/2016   HCT 34.3 (L) 04/22/2016   PLT 226 04/22/2016   GLUCOSE 100 (H) 04/22/2016   CHOL 198 06/25/2015   TRIG 121.0 06/25/2015   HDL 39.10 06/25/2015   LDLCALC 135 (H) 06/25/2015   ALT 20 04/22/2016   AST 23 04/22/2016   NA 139 04/22/2016   K 4.1 04/22/2016   CL 106 04/22/2016   CREATININE 1.37 (H) 04/22/2016   BUN 20 04/22/2016   CO2 24 04/22/2016   TSH 2.02 12/22/2015   PSA 10.85 (H) 06/25/2015   INR 1.01 10/25/2010   HGBA1C 5.2 06/25/2015    Mr Jodene Nam Head Wo Contrast  Result Date: 04/23/2016 CLINICAL DATA:  Syncope versus seizure. Abnormal MRI. Possible vascular malformation. EXAM: MRA HEAD WITHOUT CONTRAST TECHNIQUE: Angiographic images of the Circle of Willis were obtained using MRA technique without intravenous contrast. COMPARISON:  MRI head 04/23/2016 FINDINGS: There is artifact from a metallic foreign body in the right jaw. There also is an artifact across the skullbase which appears to interfere with signal in the basilar artery. For this reason, CTA is suggested for further evaluation. Both vertebral arteries appear patent to the basilar. Poor visualization of both distal vertebral arteries. Signal loss in the mid basilar most likely is artifactual. This vessel appears  normal on MRI. Superior cerebellar and posterior cerebral arteries are patent and appear normal. The small tangle vessels in the superior cerebellar cistern is best seen on the source images which were repeated and this abnormality seen on both sequences. 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 No aneurysm in the area. Internal carotid artery patent bilaterally. Anterior and middle cerebral arteries are normal bilaterally. No aneurysm in the anterior circulation IMPRESSION: This study is degraded by artifact. There is a metal foreign body in the right face causing artifact. In addition, there is artifact through the brainstem and basilar artery interfering with signal in the basilar artery. 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 Further evaluation with CT angiogram of the head is suggested. Electronically Signed   By: Franchot Gallo M.D.   On: 04/23/2016 12:33   Mr Brain Wo Contrast  Result Date: 04/23/2016 CLINICAL DATA:  76 year old male presenting with syncopal episode versus seizure episode. Initial encounter. Known jaw region retained metallic foreign body. EXAM: MRI HEAD WITHOUT CONTRAST TECHNIQUE: Multiplanar, multiecho pulse sequences of the brain and surrounding structures were obtained without intravenous contrast. COMPARISON:  Head CTs without contrast 04/22/2016 and earlier. Brain MRI 10/26/2010. FINDINGS: Brain: Chronic metal susceptibility artifact about the right face limits visualization of the anterior right hemisphere and anterior brainstem, especially on diffusion and T2* imaging. No restricted diffusion to suggest acute infarction. No midline shift, mass effect, evidence of mass lesion, ventriculomegaly, extra-axial collection or acute intracranial hemorrhage. Cervicomedullary junction and pituitary are within normal limits. There is a 15 mm area of encephalomalacia along the superior right cerebellum near midline  best seen on coronal diffusion series 600, image 11). This is in the same area where hemosiderin was suspected along the right tentorial incisura in 2012. Today the T2* and T2 weighted imaging appearance more suggests a tangle of small vessels about the straight sinus in this area (series 4, image 11, series 9, image 8 and series 7, image 11). No cerebellar edema. No other cerebellar or brainstem signal changes. Chronic patchy mostly periventricular white matter T2 and FLAIR hyperintensity is re- demonstrated with mild progression since 2012. Extent is moderate for age. No cerebral cortical encephalomalacia or definite chronic cerebral blood products. Deep gray matter nuclei remain normal for age. The hippocampal formations appear stable since 2012 and within normal limits. Vascular: Major intracranial vascular flow voids are preserved and appear stable since 2012. Skull and upper cervical spine: Negative. Normal bone marrow signal. Sinuses/Orbits: Visible orbits soft tissues and paranasal sinuses appear normal. Other: Visible internal auditory structures appear normal. Mastoid air cells are stable and well pneumatized. Stable and negative visible scalp soft tissues. IMPRESSION: 1. 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 Recommend follow-up intracranial MRA (without contrast), and also post-contrast MRI would be complementary. 2. However, no acute intracranial abnormality is identified. Visualization of some areas of the brain is once again limited by chronic jaw region retained metallic foreign body. 3. Progression of moderate for age nonspecific cerebral white matter signal changes since 2012, most commonly small vessel disease related. Electronically Signed   By: Genevie Ann M.D.   On: 04/23/2016 06:46    Assessment & Plan:   There are no diagnoses linked to this encounter. I am  having Mr. Farro maintain his aspirin, hydrOXYzine, betamethasone dipropionate, meclizine, ferrous sulfate, Vitamin D3, torsemide, tiZANidine, labetalol, oxyCODONE, and triamcinolone ointment.  No orders of the defined types were placed in this encounter.    Follow-up: No Follow-up on file.  [name], MD

## 2016-05-18 ENCOUNTER — Ambulatory Visit: Payer: Medicare Other | Admitting: Podiatry

## 2016-05-27 ENCOUNTER — Other Ambulatory Visit: Payer: Self-pay | Admitting: Internal Medicine

## 2016-06-01 ENCOUNTER — Ambulatory Visit: Payer: Medicare Other | Admitting: Internal Medicine

## 2016-06-08 DIAGNOSIS — D631 Anemia in chronic kidney disease: Secondary | ICD-10-CM | POA: Diagnosis not present

## 2016-06-08 DIAGNOSIS — E785 Hyperlipidemia, unspecified: Secondary | ICD-10-CM | POA: Diagnosis not present

## 2016-06-08 DIAGNOSIS — N400 Enlarged prostate without lower urinary tract symptoms: Secondary | ICD-10-CM | POA: Diagnosis not present

## 2016-06-08 DIAGNOSIS — N2581 Secondary hyperparathyroidism of renal origin: Secondary | ICD-10-CM | POA: Diagnosis not present

## 2016-06-08 DIAGNOSIS — N183 Chronic kidney disease, stage 3 (moderate): Secondary | ICD-10-CM | POA: Diagnosis not present

## 2016-07-05 ENCOUNTER — Other Ambulatory Visit: Payer: Self-pay | Admitting: Internal Medicine

## 2016-08-03 ENCOUNTER — Ambulatory Visit: Payer: Medicare Other | Admitting: Sports Medicine

## 2016-08-18 ENCOUNTER — Encounter: Payer: Self-pay | Admitting: Internal Medicine

## 2016-08-18 ENCOUNTER — Ambulatory Visit (INDEPENDENT_AMBULATORY_CARE_PROVIDER_SITE_OTHER): Payer: Medicare Other | Admitting: Internal Medicine

## 2016-08-18 ENCOUNTER — Other Ambulatory Visit (INDEPENDENT_AMBULATORY_CARE_PROVIDER_SITE_OTHER): Payer: Medicare Other

## 2016-08-18 VITALS — BP 160/78 | HR 83 | Temp 98.2°F | Ht 77.0 in | Wt 243.0 lb

## 2016-08-18 DIAGNOSIS — L309 Dermatitis, unspecified: Secondary | ICD-10-CM | POA: Diagnosis not present

## 2016-08-18 DIAGNOSIS — R7309 Other abnormal glucose: Secondary | ICD-10-CM | POA: Diagnosis not present

## 2016-08-18 DIAGNOSIS — N183 Chronic kidney disease, stage 3 unspecified: Secondary | ICD-10-CM

## 2016-08-18 DIAGNOSIS — I674 Hypertensive encephalopathy: Secondary | ICD-10-CM | POA: Diagnosis not present

## 2016-08-18 DIAGNOSIS — R55 Syncope and collapse: Secondary | ICD-10-CM

## 2016-08-18 DIAGNOSIS — M545 Low back pain: Secondary | ICD-10-CM

## 2016-08-18 DIAGNOSIS — R609 Edema, unspecified: Secondary | ICD-10-CM

## 2016-08-18 DIAGNOSIS — M79604 Pain in right leg: Secondary | ICD-10-CM

## 2016-08-18 LAB — BASIC METABOLIC PANEL
BUN: 21 mg/dL (ref 6–23)
CHLORIDE: 106 meq/L (ref 96–112)
CO2: 31 mEq/L (ref 19–32)
CREATININE: 1.26 mg/dL (ref 0.40–1.50)
Calcium: 9.6 mg/dL (ref 8.4–10.5)
GFR: 71.38 mL/min (ref >60.00)
GLUCOSE: 109 mg/dL — AB (ref 70–99)
Potassium: 4.9 mEq/L (ref 3.5–5.1)
Sodium: 140 mEq/L (ref 135–145)

## 2016-08-18 LAB — IBC PANEL
IRON: 68 ug/dL (ref 42–165)
Saturation Ratios: 17 % — ABNORMAL LOW (ref 20.0–50.0)
TRANSFERRIN: 286 mg/dL (ref 212.0–360.0)

## 2016-08-18 LAB — HEMOGLOBIN A1C: HEMOGLOBIN A1C: 4.9 % (ref 4.6–6.5)

## 2016-08-18 MED ORDER — HYDROCODONE-ACETAMINOPHEN 7.5-325 MG PO TABS: 1.0000 | ORAL_TABLET | Freq: Four times a day (QID) | ORAL | 0 refills | Status: AC | PRN

## 2016-08-18 NOTE — Assessment & Plan Note (Signed)
Refractory F/u w/Derm - Dr Ubaldo Glassing On Kenalog

## 2016-08-18 NOTE — Assessment & Plan Note (Signed)
States BP is ok at home - the best we can do... Labetalol, Torsemide

## 2016-08-18 NOTE — Progress Notes (Signed)
Subjective:  Patient ID: Kurt Sexton, male    DOB: 01/30/1941  Age: 76 y.o. MRN: [id number]  CC: No chief complaint on file.   HPI YASHUA BRACCO presents for HTN, arthralgias, edema - using a "pump" at home, gout f/u. C/o somnolence after meals.  Outpatient Medications Prior to Visit  Medication Sig Dispense Refill  . aspirin 81 MG EC tablet Take 81 mg by mouth daily.      . betamethasone dipropionate (DIPROLENE) 0.05 % cream Apply 1 application topically 2 (two) times daily as needed (dermatitis). Reported on 05/27/2015    . Cholecalciferol (VITAMIN D3) 2000 units capsule Take 1 capsule (2,000 Units total) by mouth daily. 100 capsule 3  . ferrous sulfate 325 (65 FE) MG tablet take 1 tablet by mouth twice a day with meals 100 tablet 1  . hydrOXYzine (ATARAX/VISTARIL) 25 MG tablet Take 1 tablet (25 mg total) by mouth every 8 (eight) hours as needed for itching or nausea. 60 tablet 1  . labetalol (NORMODYNE) 200 MG tablet TAKE 1 TABLET BY MOUTH 3 TIMES DAILY 270 tablet 2  . meclizine (ANTIVERT) 12.5 MG tablet Take 12.5 mg by mouth every 6 (six) hours as needed for dizziness.    Marland Kitchen oxyCODONE (ROXICODONE) 5 MG immediate release tablet Take 1 tablet (5 mg total) by mouth every 4 (four) hours as needed for severe pain. 15 tablet 0  . tiZANidine (ZANAFLEX) 2 MG tablet Take 1 tablet (2 mg total) by mouth at bedtime as needed for muscle spasms. 30 tablet 5  . torsemide (DEMADEX) 20 MG tablet Take 1 tablet (20 mg total) by mouth daily. (Patient taking differently: Take 20 mg by mouth daily as needed (fluid). ) 30 tablet 2  . triamcinolone ointment (KENALOG) 0.1 % APPLY TO AFFECTED AREA TWICE A DAY IF NEEDED FOR SKIN IRRITATION 80 g 2   No facility-administered medications prior to visit.     ROS Review of Systems  Constitutional: Positive for fatigue. Negative for appetite change and unexpected weight change.  HENT: Negative for congestion, nosebleeds, sneezing, sore throat and trouble  swallowing.   Eyes: Negative for itching and visual disturbance.  Respiratory: Negative for cough.   Cardiovascular: Negative for chest pain, palpitations and leg swelling.  Gastrointestinal: Negative for abdominal distention, blood in stool, diarrhea and nausea.  Genitourinary: Negative for frequency and hematuria.  Musculoskeletal: Positive for arthralgias, back pain and gait problem. Negative for joint swelling and neck pain.  Skin: Negative for rash.  Neurological: Negative for dizziness, tremors, speech difficulty and weakness.  Psychiatric/Behavioral: Positive for sleep disturbance. Negative for agitation, dysphoric mood and suicidal ideas. The patient is not nervous/anxious.     Objective:  BP (!) 160/78 (BP Location: Left Arm, Patient Position: Sitting, Cuff Size: Large)   Pulse 83   Temp 98.2 F (36.8 C) (Oral)   Ht 6\' 5"  (1.956 m)   Wt 243 lb (110.2 kg)   SpO2 99%   BMI 28.82 kg/m   BP Readings from Last 3 Encounters:  08/18/16 (!) 160/78  05/14/16 (!) 162/72  04/24/16 (!) 162/65    Wt Readings from Last 3 Encounters:  08/18/16 243 lb (110.2 kg)  05/14/16 233 lb 12 oz (106 kg)  04/22/16 230 lb (104.3 kg)    Physical Exam  Constitutional: He is oriented to person, place, and time. He appears well-developed. No distress.  NAD  HENT:  Mouth/Throat: Oropharynx is clear and moist.  Eyes: Conjunctivae are normal. Pupils are equal, round,  and reactive to light.  Neck: Normal range of motion. No JVD present. No thyromegaly present.  Cardiovascular: Normal rate, regular rhythm, normal heart sounds and intact distal pulses.  Exam reveals no gallop and no friction rub.   No murmur heard. Pulmonary/Chest: Effort normal and breath sounds normal. No respiratory distress. He has no wheezes. He has no rales. He exhibits no tenderness.  Abdominal: Soft. Bowel sounds are normal. He exhibits no distension and no mass. There is no tenderness. There is no rebound and no guarding.    Musculoskeletal: Normal range of motion. He exhibits edema and tenderness.  Lymphadenopathy:    He has no cervical adenopathy.  Neurological: He is alert and oriented to person, place, and time. He has normal reflexes. No cranial nerve deficit. He exhibits normal muscle tone. He displays a negative Romberg sign. Coordination abnormal. Gait normal.  Skin: Skin is warm and dry. Rash noted.  Psychiatric: He has a normal mood and affect. His behavior is normal. Judgment and thought content normal.  eczema Cane Edema 1-2+ B   Lab Results  Component Value Date   WBC 7.0 05/14/2016   HGB 11.0 (L) 05/14/2016   HCT 34.2 (L) 05/14/2016   PLT 265.0 05/14/2016   GLUCOSE 107 (H) 05/14/2016   CHOL 198 06/25/2015   TRIG 121.0 06/25/2015   HDL 39.10 06/25/2015   LDLCALC 135 (H) 06/25/2015   ALT 20 04/22/2016   AST 23 04/22/2016   NA 138 05/14/2016   K 4.9 05/14/2016   CL 104 05/14/2016   CREATININE 1.31 05/14/2016   BUN 25 (H) 05/14/2016   CO2 28 05/14/2016   TSH 2.02 12/22/2015   PSA 10.85 (H) 06/25/2015   INR 1.01 10/25/2010   HGBA1C 5.2 05/14/2016    Mr Jodene Nam Head Wo Contrast  Result Date: 04/23/2016 CLINICAL DATA:  Syncope versus seizure. Abnormal MRI. Possible vascular malformation. EXAM: MRA HEAD WITHOUT CONTRAST TECHNIQUE: Angiographic images of the Circle of Willis were obtained using MRA technique without intravenous contrast. COMPARISON:  MRI head 04/23/2016 FINDINGS: There is artifact from a metallic foreign body in the right jaw. There also is an artifact across the skullbase which appears to interfere with signal in the basilar artery. For this reason, CTA is suggested for further evaluation. Both vertebral arteries appear patent to the basilar. Poor visualization of both distal vertebral arteries. Signal loss in the mid basilar most likely is artifactual. This vessel appears normal on MRI. Superior cerebellar and posterior cerebral arteries are patent and appear normal. The small  tangle vessels in the superior cerebellar cistern is best seen on the source images which were repeated and this abnormality seen on both sequences. 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 No aneurysm in the area. Internal carotid artery patent bilaterally. Anterior and middle cerebral arteries are normal bilaterally. No aneurysm in the anterior circulation IMPRESSION: This study is degraded by artifact. There is a metal foreign body in the right face causing artifact. In addition, there is artifact through the brainstem and basilar artery interfering with signal in the basilar artery. 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 Further evaluation with CT angiogram of the head is suggested. Electronically Signed   By: Franchot Gallo M.D.   On: 04/23/2016 12:33   Mr Brain Wo Contrast  Result Date: 04/23/2016 CLINICAL DATA:  76 year old male presenting with syncopal episode versus seizure episode. Initial encounter. Known jaw region retained metallic foreign body. EXAM: MRI HEAD  WITHOUT CONTRAST TECHNIQUE: Multiplanar, multiecho pulse sequences of the brain and surrounding structures were obtained without intravenous contrast. COMPARISON:  Head CTs without contrast 04/22/2016 and earlier. Brain MRI 10/26/2010. FINDINGS: Brain: Chronic metal susceptibility artifact about the right face limits visualization of the anterior right hemisphere and anterior brainstem, especially on diffusion and T2* imaging. No restricted diffusion to suggest acute infarction. No midline shift, mass effect, evidence of mass lesion, ventriculomegaly, extra-axial collection or acute intracranial hemorrhage. Cervicomedullary junction and pituitary are within normal limits. There is a 15 mm area of encephalomalacia along the superior right cerebellum near midline best seen on coronal diffusion series 600, image 11). This is in the same area where hemosiderin was suspected  along the right tentorial incisura in 2012. Today the T2* and T2 weighted imaging appearance more suggests a tangle of small vessels about the straight sinus in this area (series 4, image 11, series 9, image 8 and series 7, image 11). No cerebellar edema. No other cerebellar or brainstem signal changes. Chronic patchy mostly periventricular white matter T2 and FLAIR hyperintensity is re- demonstrated with mild progression since 2012. Extent is moderate for age. No cerebral cortical encephalomalacia or definite chronic cerebral blood products. Deep gray matter nuclei remain normal for age. The hippocampal formations appear stable since 2012 and within normal limits. Vascular: Major intracranial vascular flow voids are preserved and appear stable since 2012. Skull and upper cervical spine: Negative. Normal bone marrow signal. Sinuses/Orbits: Visible orbits soft tissues and paranasal sinuses appear normal. Other: Visible internal auditory structures appear normal. Mastoid air cells are stable and well pneumatized. Stable and negative visible scalp soft tissues. IMPRESSION: 1. 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 Recommend follow-up intracranial MRA (without contrast), and also post-contrast MRI would be complementary. 2. However, no acute intracranial abnormality is identified. Visualization of some areas of the brain is once again limited by chronic jaw region retained metallic foreign body. 3. Progression of moderate for age nonspecific cerebral white matter signal changes since 2012, most commonly small vessel disease related. Electronically Signed   By: Genevie Ann M.D.   On: 04/23/2016 06:46    Assessment & Plan:   There are no diagnoses linked to this encounter. I am having Mr. Bordeau maintain his aspirin, hydrOXYzine, betamethasone dipropionate, meclizine, Vitamin D3,  torsemide, tiZANidine, labetalol, oxyCODONE, ferrous sulfate, and triamcinolone ointment.  No orders of the defined types were placed in this encounter.    Follow-up: No Follow-up on file.  [name], MD

## 2016-08-18 NOTE — Assessment & Plan Note (Signed)
Labs

## 2016-08-18 NOTE — Assessment & Plan Note (Signed)
[facility]

## 2016-08-19 LAB — RHEUMATOID FACTOR: Rhuematoid fact SerPl-aCnc: <14 IU/mL (ref <14)

## 2016-08-24 ENCOUNTER — Ambulatory Visit (INDEPENDENT_AMBULATORY_CARE_PROVIDER_SITE_OTHER): Payer: Medicare Other | Admitting: Sports Medicine

## 2016-08-24 DIAGNOSIS — M79674 Pain in right toe(s): Secondary | ICD-10-CM

## 2016-08-24 DIAGNOSIS — M79675 Pain in left toe(s): Secondary | ICD-10-CM

## 2016-08-24 DIAGNOSIS — I890 Lymphedema, not elsewhere classified: Secondary | ICD-10-CM

## 2016-08-24 DIAGNOSIS — B351 Tinea unguium: Secondary | ICD-10-CM | POA: Diagnosis not present
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
## 2016-08-26 DIAGNOSIS — H35033 Hypertensive retinopathy, bilateral: Secondary | ICD-10-CM | POA: Diagnosis not present

## 2016-08-26 DIAGNOSIS — H34831 Tributary (branch) retinal vein occlusion, right eye, with macular edema: Secondary | ICD-10-CM | POA: Diagnosis not present

## 2016-08-26 DIAGNOSIS — H40023 Open angle with borderline findings, high risk, bilateral: Secondary | ICD-10-CM | POA: Diagnosis not present

## 2016-08-26 DIAGNOSIS — H3561 Retinal hemorrhage, right eye: Secondary | ICD-10-CM | POA: Diagnosis not present

## 2016-08-30 DIAGNOSIS — H34831 Tributary (branch) retinal vein occlusion, right eye, with macular edema: Secondary | ICD-10-CM | POA: Diagnosis not present

## 2016-08-30 DIAGNOSIS — H35351 Cystoid macular degeneration, right eye: Secondary | ICD-10-CM | POA: Diagnosis not present

## 2016-08-30 DIAGNOSIS — H3561 Retinal hemorrhage, right eye: Secondary | ICD-10-CM | POA: Diagnosis not present

## 2016-08-30 DIAGNOSIS — H35041 Retinal micro-aneurysms, unspecified, right eye: Secondary | ICD-10-CM | POA: Diagnosis not present

## 2016-08-30 IMAGING — MR MR LUMBAR SPINE W/O CM
5 series · 42 of 48 positions shown · non-contrast
Comparison: CT scan of the abdomen and pelvis dated 10/24/2014 and
lumbar MRI dated 10/18/2008

CLINICAL DATA: Bilateral leg weakness with hyperreflexia. Gait
instability.

EXAM:
MRI LUMBAR SPINE WITHOUT CONTRAST
TECHNIQUE: Multiplanar, multisequence MR imaging of the lumbar spine was
performed. No intravenous contrast was administered.

[Series 3: T2 · sagittal · 4.0mm · 0.88mm/px · 6 of 13 slices shown (1 of 2)]
[im 1/13]
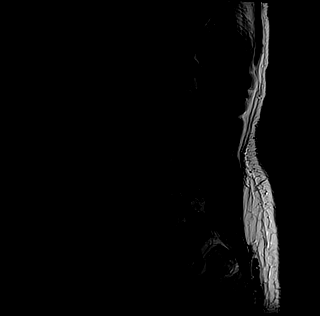
[im 3/13]
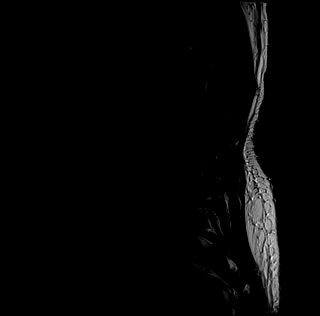
[im 5/13]
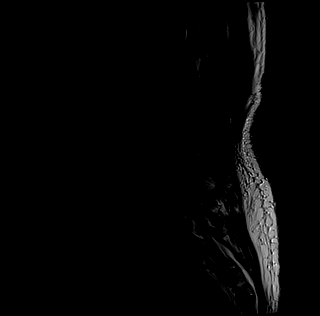
[im 8/13]
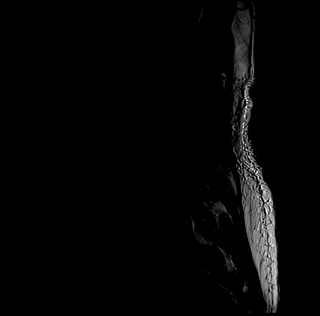
[im 10/13]
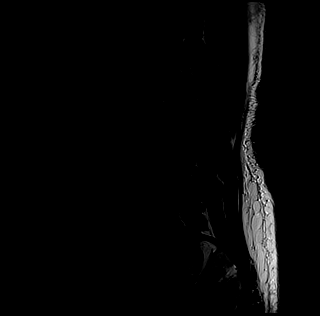
[im 13/13]
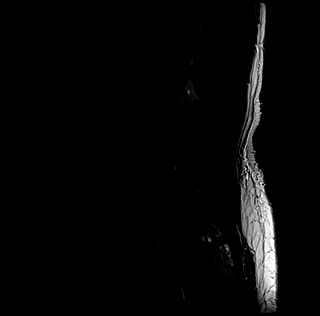

[Series 4: tirm sag · sagittal · 4.0mm · 0.55mm/px · 6 of 13 slices shown]
[im 1/13]
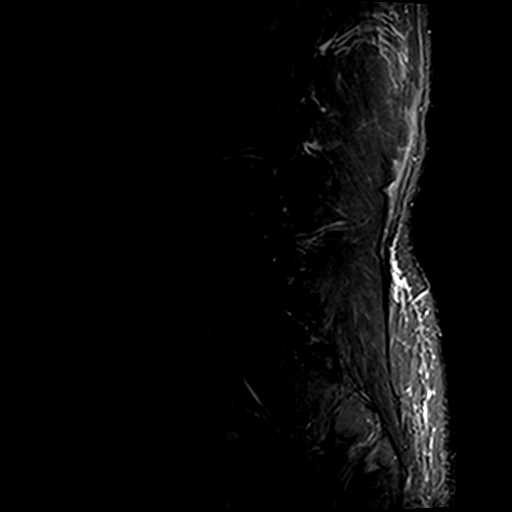
[im 3/13]
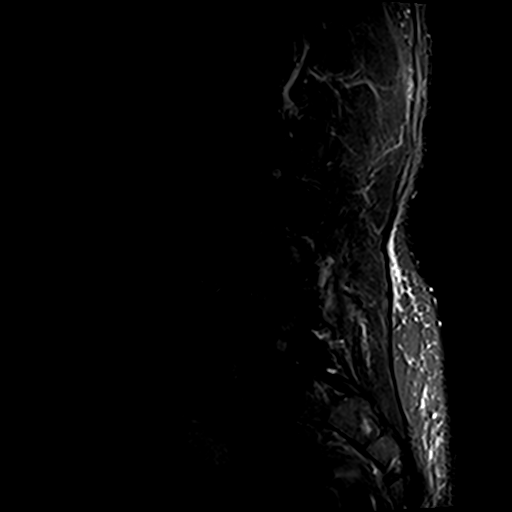
[im 5/13]
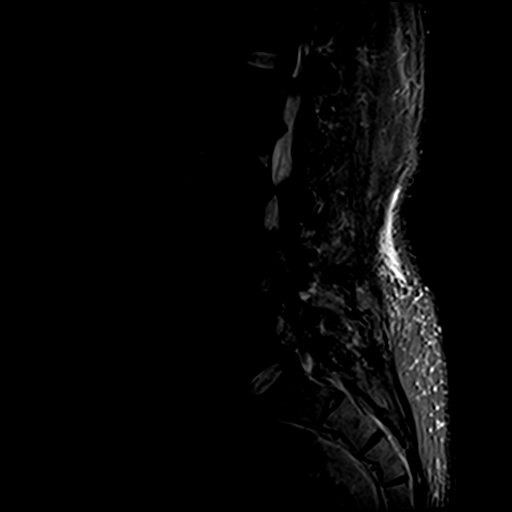
[im 8/13]
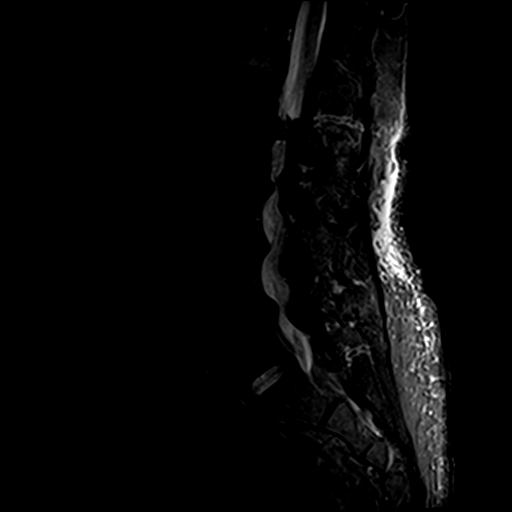
[im 10/13]
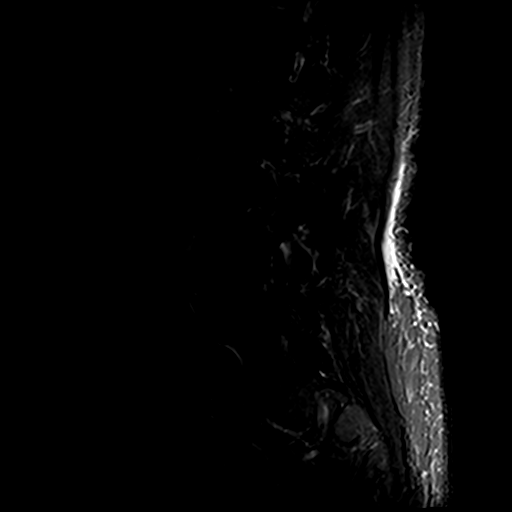
[im 13/13]
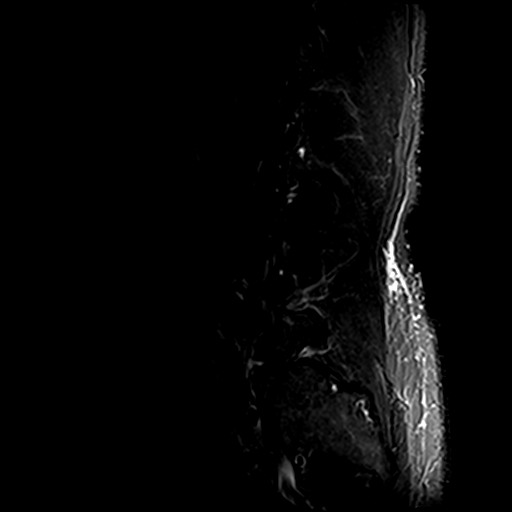

[Series 5: T1 · sagittal · 4.0mm · 0.88mm/px · 6 of 13 slices shown (1 of 2)]
[im 1/13]
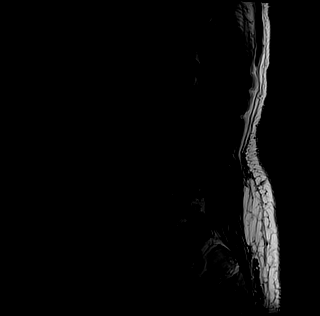
[im 3/13]
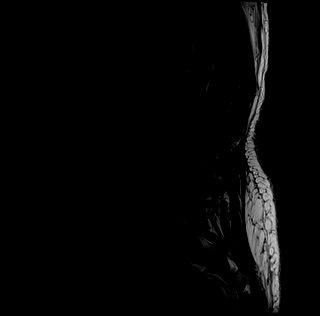
[im 5/13]
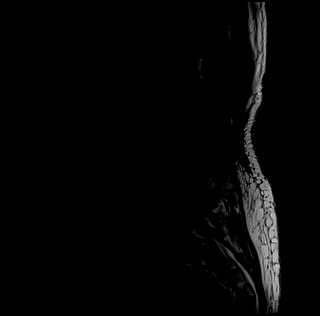
[im 8/13]
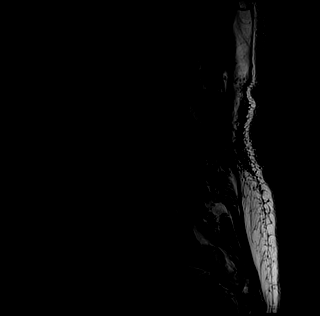
[im 10/13]
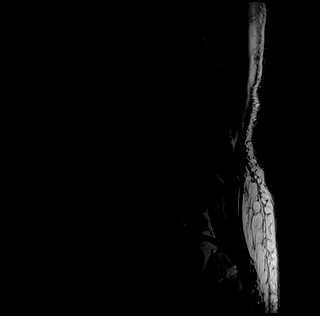
[im 13/13]
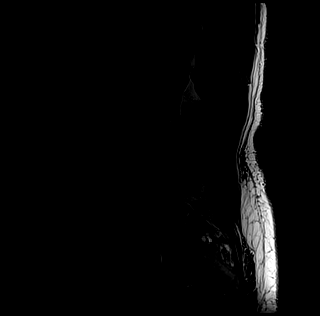

[Series 7: T1 · axial · 4.0mm · 0.78mm/px · z∈[-87,+90]mm · 9 of 33 slices shown (2 of 2)]
[im 1/33]
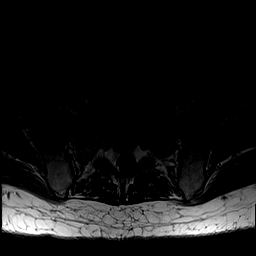
[im 5/33]
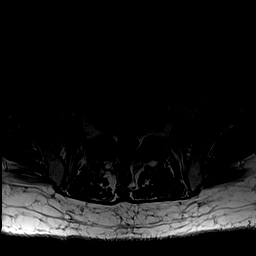
[im 10/33]
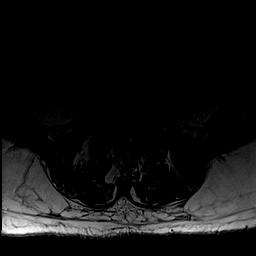
[im 14/33]
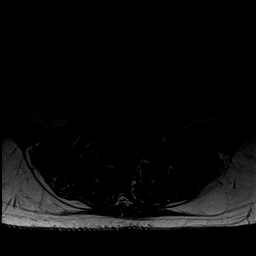
[im 17/33]
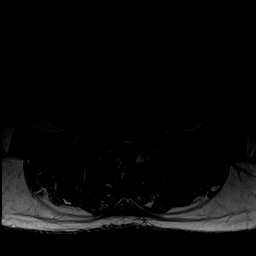
[im 19/33]
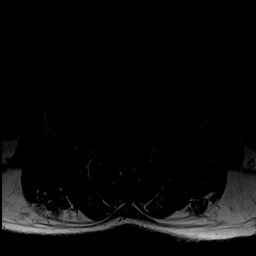
[im 23/33]
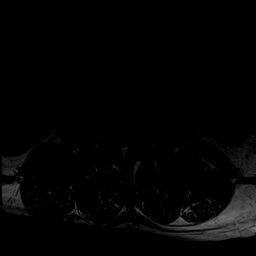
[im 28/33]
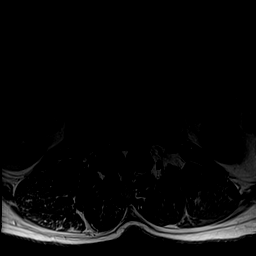
[im 33/33]
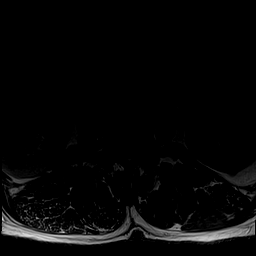

[Series 8: T2 · axial · 4.0mm · 0.78mm/px · z∈[-87,+90]mm · 15 of 33 slices shown (2 of 2)]
[im 1/33]
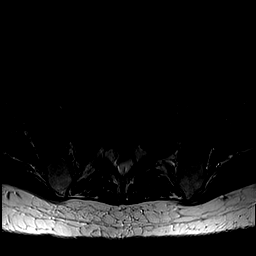
[im 3/33]
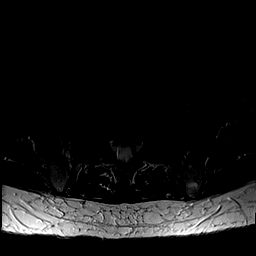
[im 5/33]
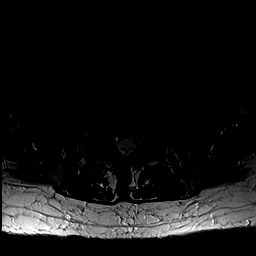
[im 7/33]
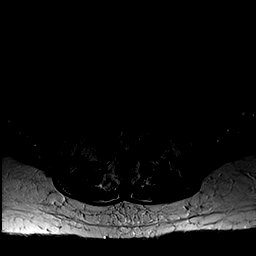
[im 10/33]
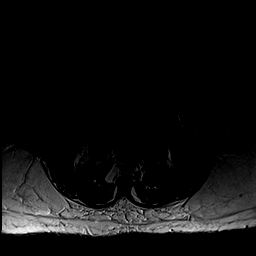
[im 12/33]
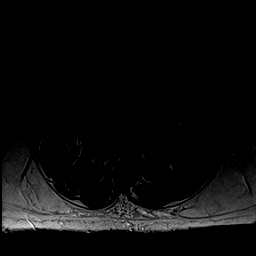
[im 14/33]
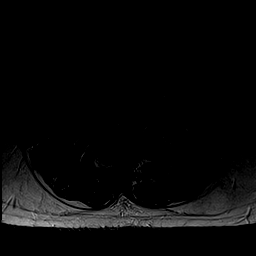
[im 17/33]
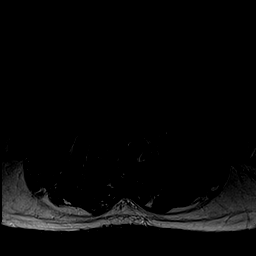
[im 19/33]
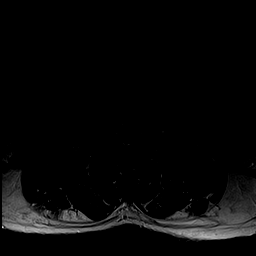
[im 21/33]
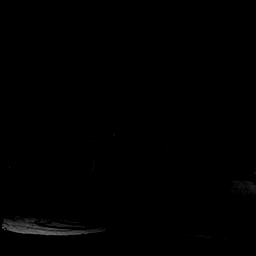
[im 23/33]
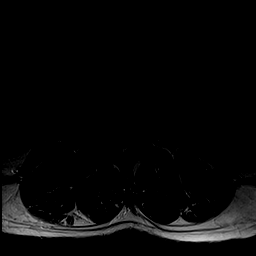
[im 26/33]
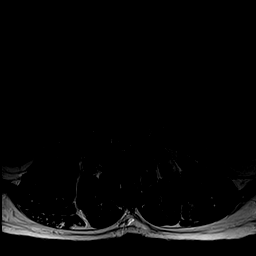
[im 28/33]
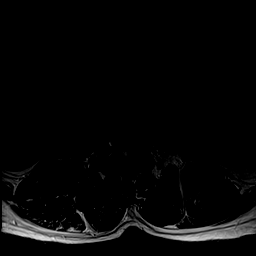
[im 30/33]
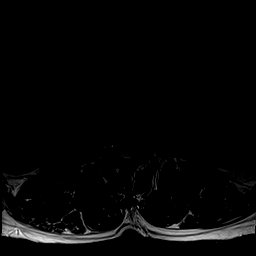
[im 33/33]
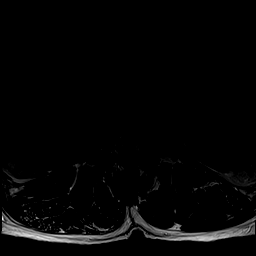

[42 of 48 positions shown; findings below may reference images not displayed]

FINDINGS: Normal conus tip at L1.  Normal paraspinal soft tissues.

T12-L1:  Normal.

L1-2: Increased broad-based soft disc bulge with a new focal soft
disc extrusion into the left lateral recess and proximal left neural
foramen. New hypertrophy of the left ligamentum flavum. This should
affect the left L1 and L2 nerves. The thecal sac is slightly
compressed.

L2-3: Small broad-based soft disc protrusion extending foraminal and
extra foraminal, unchanged. Hypertrophy of the facet joints and
ligamentum flavum create moderately severe spinal stenosis and
bilateral lateral recess compression, right more than left.

L3-4: Broad-based soft disc protrusion extending foraminal and extra
foraminal on the right with a slight mass effect upon the right L3
nerve lateral to the neural foramen. The protrusion is slightly more
prominent than on the prior study. Increased bilateral facet
arthritis and hypertrophy narrowing both lateral recesses, right
more than left.

L4-5: 3 mm spondylolisthesis due to severe bilateral facet
arthritis. This has slightly increased since the prior study. Severe
spinal stenosis due to hypertrophy of the ligamentum flavum and
facet joints with marked compression of the lateral recesses which
should affect both L5 nerves. Moderately severe right foraminal
stenosis which might affect the right L4 nerve.

L5-S1: Normal disc. Minimal degenerative changes of the right facet
joint.
IMPRESSION: 1. Severe spinal stenosis at L4-5, slightly increased since the
prior study with slightly increased grade 1 spondylolisthesis.
2. Increased disc protrusion at L3-4 far lateral on the right with a
slight mass effect upon the right L3 nerve lateral to the neural
foramen.
3. New soft disc extrusion into the left lateral recess and left
neural foramen at L1-2 which could affect the left L1 and L2 nerves.
4. Chronic moderately severe spinal stenosis at L2-3.

## 2016-09-02 DIAGNOSIS — H35351 Cystoid macular degeneration, right eye: Secondary | ICD-10-CM | POA: Diagnosis not present

## 2016-09-02 DIAGNOSIS — H34831 Tributary (branch) retinal vein occlusion, right eye, with macular edema: Secondary | ICD-10-CM | POA: Diagnosis not present

## 2016-09-02 DIAGNOSIS — H35041 Retinal micro-aneurysms, unspecified, right eye: Secondary | ICD-10-CM | POA: Diagnosis not present

## 2016-09-18 ENCOUNTER — Encounter (HOSPITAL_COMMUNITY): Payer: Self-pay

## 2016-09-18 ENCOUNTER — Inpatient Hospital Stay (HOSPITAL_COMMUNITY)
Admission: EM | Admit: 2016-09-18 | Discharge: 2016-10-20 | DRG: 064 | Disposition: E | Payer: Medicare Other | Attending: Neurology | Admitting: Neurology

## 2016-09-18 ENCOUNTER — Emergency Department (HOSPITAL_COMMUNITY): Payer: Medicare Other

## 2016-09-18 ENCOUNTER — Inpatient Hospital Stay (HOSPITAL_COMMUNITY): Payer: Medicare Other

## 2016-09-18 DIAGNOSIS — N400 Enlarged prostate without lower urinary tract symptoms: Secondary | ICD-10-CM | POA: Diagnosis not present

## 2016-09-18 DIAGNOSIS — I615 Nontraumatic intracerebral hemorrhage, intraventricular: Secondary | ICD-10-CM | POA: Diagnosis present

## 2016-09-18 DIAGNOSIS — I161 Hypertensive emergency: Secondary | ICD-10-CM | POA: Diagnosis present

## 2016-09-18 DIAGNOSIS — R402312 Coma scale, best motor response, none, at arrival to emergency department: Secondary | ICD-10-CM | POA: Diagnosis not present

## 2016-09-18 DIAGNOSIS — E7800 Pure hypercholesterolemia, unspecified: Secondary | ICD-10-CM | POA: Diagnosis not present

## 2016-09-18 DIAGNOSIS — M109 Gout, unspecified: Secondary | ICD-10-CM | POA: Diagnosis not present

## 2016-09-18 DIAGNOSIS — G9382 Brain death: Secondary | ICD-10-CM | POA: Diagnosis present

## 2016-09-18 DIAGNOSIS — Z8673 Personal history of transient ischemic attack (TIA), and cerebral infarction without residual deficits: Secondary | ICD-10-CM | POA: Diagnosis not present

## 2016-09-18 DIAGNOSIS — Z8546 Personal history of malignant neoplasm of prostate: Secondary | ICD-10-CM | POA: Diagnosis not present

## 2016-09-18 DIAGNOSIS — I10 Essential (primary) hypertension: Secondary | ICD-10-CM | POA: Diagnosis not present

## 2016-09-18 DIAGNOSIS — I110 Hypertensive heart disease with heart failure: Secondary | ICD-10-CM | POA: Diagnosis not present

## 2016-09-18 DIAGNOSIS — R29738 NIHSS score 38: Secondary | ICD-10-CM | POA: Diagnosis not present

## 2016-09-18 DIAGNOSIS — I614 Nontraumatic intracerebral hemorrhage in cerebellum: Principal | ICD-10-CM

## 2016-09-18 DIAGNOSIS — I509 Heart failure, unspecified: Secondary | ICD-10-CM | POA: Diagnosis not present

## 2016-09-18 DIAGNOSIS — R4189 Other symptoms and signs involving cognitive functions and awareness: Secondary | ICD-10-CM | POA: Diagnosis not present

## 2016-09-18 DIAGNOSIS — G911 Obstructive hydrocephalus: Secondary | ICD-10-CM | POA: Diagnosis not present

## 2016-09-18 DIAGNOSIS — Z981 Arthrodesis status: Secondary | ICD-10-CM

## 2016-09-18 DIAGNOSIS — R402222 Coma scale, best verbal response, incomprehensible words, at arrival to emergency department: Secondary | ICD-10-CM | POA: Diagnosis present

## 2016-09-18 DIAGNOSIS — R404 Transient alteration of awareness: Secondary | ICD-10-CM | POA: Diagnosis not present

## 2016-09-18 DIAGNOSIS — G935 Compression of brain: Secondary | ICD-10-CM | POA: Diagnosis present

## 2016-09-18 DIAGNOSIS — M19041 Primary osteoarthritis, right hand: Secondary | ICD-10-CM | POA: Diagnosis not present

## 2016-09-18 DIAGNOSIS — R402112 Coma scale, eyes open, never, at arrival to emergency department: Secondary | ICD-10-CM | POA: Diagnosis present

## 2016-09-18 DIAGNOSIS — Z888 Allergy status to other drugs, medicaments and biological substances status: Secondary | ICD-10-CM

## 2016-09-18 DIAGNOSIS — Q282 Arteriovenous malformation of cerebral vessels: Secondary | ICD-10-CM | POA: Diagnosis not present

## 2016-09-18 DIAGNOSIS — Z809 Family history of malignant neoplasm, unspecified: Secondary | ICD-10-CM | POA: Diagnosis not present

## 2016-09-18 DIAGNOSIS — R918 Other nonspecific abnormal finding of lung field: Secondary | ICD-10-CM | POA: Diagnosis not present

## 2016-09-18 DIAGNOSIS — R1111 Vomiting without nausea: Secondary | ICD-10-CM | POA: Diagnosis not present

## 2016-09-18 DIAGNOSIS — R55 Syncope and collapse: Secondary | ICD-10-CM | POA: Diagnosis not present

## 2016-09-18 DIAGNOSIS — N183 Chronic kidney disease, stage 3 unspecified: Secondary | ICD-10-CM | POA: Diagnosis present

## 2016-09-18 DIAGNOSIS — R29729 NIHSS score 29: Secondary | ICD-10-CM | POA: Diagnosis not present

## 2016-09-18 DIAGNOSIS — S06369A Traumatic hemorrhage of cerebrum, unspecified, with loss of consciousness of unspecified duration, initial encounter: Secondary | ICD-10-CM | POA: Diagnosis not present

## 2016-09-18 DIAGNOSIS — R29818 Other symptoms and signs involving the nervous system: Secondary | ICD-10-CM | POA: Diagnosis not present

## 2016-09-18 DIAGNOSIS — Z882 Allergy status to sulfonamides status: Secondary | ICD-10-CM

## 2016-09-18 DIAGNOSIS — I619 Nontraumatic intracerebral hemorrhage, unspecified: Secondary | ICD-10-CM

## 2016-09-18 DIAGNOSIS — M1711 Unilateral primary osteoarthritis, right knee: Secondary | ICD-10-CM | POA: Diagnosis present

## 2016-09-18 DIAGNOSIS — Z8249 Family history of ischemic heart disease and other diseases of the circulatory system: Secondary | ICD-10-CM

## 2016-09-18 DIAGNOSIS — Z01818 Encounter for other preprocedural examination: Secondary | ICD-10-CM

## 2016-09-18 DIAGNOSIS — Z4682 Encounter for fitting and adjustment of non-vascular catheter: Secondary | ICD-10-CM | POA: Diagnosis not present

## 2016-09-18 DIAGNOSIS — R42 Dizziness and giddiness: Secondary | ICD-10-CM | POA: Diagnosis not present

## 2016-09-18 LAB — CBG MONITORING, ED: GLUCOSE-CAPILLARY: 145 mg/dL — AB (ref 65–99)

## 2016-09-18 LAB — CBC WITH DIFFERENTIAL/PLATELET
Basophils Absolute: 0.1 10*3/uL (ref 0.0–0.1)
Basophils Relative: 1 %
Eosinophils Absolute: 1 10*3/uL — ABNORMAL HIGH (ref 0.0–0.7)
Eosinophils Relative: 12 %
HCT: 39 % (ref 39.0–52.0)
Hemoglobin: 12.4 g/dL — ABNORMAL LOW (ref 13.0–17.0)
Lymphocytes Relative: 32 %
Lymphs Abs: 2.5 10*3/uL (ref 0.7–4.0)
MCH: 26.6 pg (ref 26.0–34.0)
MCHC: 31.8 g/dL (ref 30.0–36.0)
MCV: 83.7 fL (ref 78.0–100.0)
Monocytes Absolute: 0.5 10*3/uL (ref 0.1–1.0)
Monocytes Relative: 7 %
Neutro Abs: 3.7 10*3/uL (ref 1.7–7.7)
Neutrophils Relative %: 48 %
Platelets: 217 10*3/uL (ref 150–400)
RBC: 4.66 MIL/uL (ref 4.22–5.81)
RDW: 12.6 % (ref 11.5–15.5)
WBC: 7.8 10*3/uL (ref 4.0–10.5)

## 2016-09-18 LAB — I-STAT CHEM 8, ED
BUN: 21 mg/dL — ABNORMAL HIGH (ref 6–20)
CALCIUM ION: 1.11 mmol/L — AB (ref 1.15–1.40)
CHLORIDE: 103 mmol/L (ref 101–111)
Creatinine, Ser: 1 mg/dL (ref 0.61–1.24)
GLUCOSE: 134 mg/dL — AB (ref 65–99)
HCT: 41 % (ref 39.0–52.0)
Hemoglobin: 13.9 g/dL (ref 13.0–17.0)
Potassium: 3.5 mmol/L (ref 3.5–5.1)
Sodium: 141 mmol/L (ref 135–145)
TCO2: 24 mmol/L (ref 0–100)

## 2016-09-18 LAB — COMPREHENSIVE METABOLIC PANEL
ALT: 21 U/L (ref 17–63)
AST: 26 U/L (ref 15–41)
Albumin: 4.1 g/dL (ref 3.5–5.0)
Alkaline Phosphatase: 106 U/L (ref 38–126)
Anion gap: 9 (ref 5–15)
BUN: 17 mg/dL (ref 6–20)
CO2: 23 mmol/L (ref 22–32)
Calcium: 9.1 mg/dL (ref 8.9–10.3)
Chloride: 105 mmol/L (ref 101–111)
Creatinine, Ser: 1.12 mg/dL (ref 0.61–1.24)
GFR calc Af Amer: >60 mL/min (ref >60)
GFR calc non Af Amer: >60 mL/min (ref >60)
Glucose, Bld: 130 mg/dL — ABNORMAL HIGH (ref 65–99)
Potassium: 3.5 mmol/L (ref 3.5–5.1)
Sodium: 137 mmol/L (ref 135–145)
Total Bilirubin: 1 mg/dL (ref 0.3–1.2)
Total Protein: 7.6 g/dL (ref 6.5–8.1)

## 2016-09-18 LAB — TROPONIN I: Troponin I: <0.03 ng/mL (ref <0.03)

## 2016-09-18 LAB — POCT I-STAT 3, ART BLOOD GAS (G3+)
ACID-BASE DEFICIT: 2 mmol/L (ref 0.0–2.0)
Bicarbonate: 23.8 mmol/L (ref 20.0–28.0)
O2 SAT: 84 %
PH ART: 7.347 — AB (ref 7.350–7.450)
PO2 ART: 52 mmHg — AB (ref 83.0–108.0)
TCO2: 25 mmol/L (ref 0–100)
pCO2 arterial: 43.3 mmHg (ref 32.0–48.0)

## 2016-09-18 LAB — PROTIME-INR
INR: 0.98
Prothrombin Time: 13 seconds (ref 11.4–15.2)

## 2016-09-18 LAB — MAGNESIUM: Magnesium: 1.9 mg/dL (ref 1.7–2.4)

## 2016-09-18 LAB — I-STAT TROPONIN, ED: TROPONIN I, POC: 0 ng/mL (ref 0.00–0.08)

## 2016-09-18 LAB — MRSA PCR SCREENING: MRSA by PCR: NEGATIVE

## 2016-09-18 LAB — APTT: APTT: 24 s (ref 24–36)

## 2016-09-18 MED ORDER — ACETAMINOPHEN 325 MG PO TABS
650.0000 mg | ORAL_TABLET | ORAL | Status: DC | PRN
  Administered 2016-09-19: 650 mg via ORAL
  Filled 2016-09-18: qty 2

## 2016-09-18 MED ORDER — CLEVIDIPINE BUTYRATE 0.5 MG/ML IV EMUL
0.0000 mg/h | INTRAVENOUS | Status: DC
  Administered 2016-09-18: 2 mg/h via INTRAVENOUS

## 2016-09-18 MED ORDER — SODIUM CHLORIDE 0.9 % IV SOLN
INTRAVENOUS | Status: DC
  Administered 2016-09-18 – 2016-09-19 (×3): via INTRAVENOUS

## 2016-09-18 MED ORDER — ACETAMINOPHEN 160 MG/5ML PO SOLN: 650.0000 mg | ORAL | Status: DC | PRN

## 2016-09-18 MED ORDER — SENNOSIDES-DOCUSATE SODIUM 8.6-50 MG PO TABS
1.0000 | ORAL_TABLET | Freq: Two times a day (BID) | ORAL | Status: DC
  Administered 2016-09-19: 1 via ORAL
  Filled 2016-09-18: qty 1

## 2016-09-18 MED ORDER — PROPOFOL 1000 MG/100ML IV EMUL
5.0000 ug/kg/min | Freq: Once | INTRAVENOUS | Status: AC
  Administered 2016-09-18: 5 ug/kg/min via INTRAVENOUS
  Filled 2016-09-18: qty 100

## 2016-09-18 MED ORDER — CHLORHEXIDINE GLUCONATE 0.12% ORAL RINSE (MEDLINE KIT)
15.0000 mL | Freq: Two times a day (BID) | OROMUCOSAL | Status: DC
  Administered 2016-09-18 – 2016-09-19 (×3): 15 mL via OROMUCOSAL

## 2016-09-18 MED ORDER — IOPAMIDOL (ISOVUE-370) INJECTION 76%
INTRAVENOUS | Status: AC
  Filled 2016-09-18: qty 50

## 2016-09-18 MED ORDER — ACETAMINOPHEN 650 MG RE SUPP
650.0000 mg | RECTAL | Status: DC | PRN
Start: 2016-09-18 — End: 2016-09-19

## 2016-09-18 MED ORDER — NOREPINEPHRINE BITARTRATE 1 MG/ML IV SOLN
0.0000 ug/min | INTRAVENOUS | Status: DC
  Administered 2016-09-18: 2 ug/min via INTRAVENOUS
  Administered 2016-09-19: 18 ug/min via INTRAVENOUS
  Administered 2016-09-19 (×4): 30 ug/min via INTRAVENOUS
  Filled 2016-09-18 (×8): qty 4

## 2016-09-18 MED ORDER — ORAL CARE MOUTH RINSE
15.0000 mL | OROMUCOSAL | Status: DC
  Administered 2016-09-19 (×7): 15 mL via OROMUCOSAL

## 2016-09-18 MED ORDER — SODIUM CHLORIDE 0.9 % IV BOLUS (SEPSIS)
250.0000 mL | Freq: Once | INTRAVENOUS | Status: AC
  Administered 2016-09-18: 250 mL via INTRAVENOUS

## 2016-09-18 MED ORDER — ETOMIDATE 2 MG/ML IV SOLN
INTRAVENOUS | Status: DC | PRN
  Administered 2016-09-18: 20 mg via INTRAVENOUS

## 2016-09-18 MED ORDER — STROKE: EARLY STAGES OF RECOVERY BOOK
Freq: Once | Status: DC
  Filled 2016-09-18: qty 1

## 2016-09-18 MED ORDER — MANNITOL 25 % IV SOLN
25.0000 g | INTRAVENOUS | Status: AC
  Administered 2016-09-18: 25 g via INTRAVENOUS
  Filled 2016-09-18: qty 100

## 2016-09-18 MED ORDER — SUCCINYLCHOLINE CHLORIDE 20 MG/ML IJ SOLN
INTRAMUSCULAR | Status: DC | PRN
  Administered 2016-09-18: 100 mg via INTRAVENOUS

## 2016-09-18 MED ORDER — PANTOPRAZOLE SODIUM 40 MG IV SOLR
40.0000 mg | Freq: Every day | INTRAVENOUS | Status: DC
  Administered 2016-09-18: 40 mg via INTRAVENOUS
  Filled 2016-09-18: qty 40

## 2016-09-18 NOTE — Progress Notes (Signed)
CDS notified of impending death.  Referral number [id number].  Please return call for cardiac TOD.

## 2016-09-18 NOTE — ED Notes (Signed)
Stroke Team at bedside

## 2016-09-18 NOTE — Code Documentation (Addendum)
76 y.o. Male with PMHx of CHF, BPH, CVA, HLD, prostate CA and venous insufficiency who was in his normal state of health this morning when he was heard falling in his bathroom. Family responded to the fall and found the patient to be unresponsive. EMS was notified and brought pt to [facility] ED. Upon arrival to the ED, code stroke called and stroke team responded quickly to the patient's bedside. Upon arrival of the stroke team, patient was quickly assessed prior to intubation. Once patient was intubated, he was then taken to CT.   On initial exam, patient hypertensive with initial manual BP of 280/130, multiple episodes of emesis en route, unresponsive, pupils pinpoint and nonreactive bilaterally, no movement to noxious stimuli in BLU and withdrew from noxious stimuli in his BLE. NIHSS 29. See EMR for NIHSS and code stroke times. Per radiologist report, CT revealed an acute right cerebellar intraparenchymal hemorrhage with 5.5 x 2.9 x 2.5 cm intraparenchymal hematoma with intraventricular penetration and early hydrocephalus. Cleviprex gtt started in CT and titrated to bring patient below BP parameters. Patient taken back to ED and neurosurgery consulted. Patient's BP below parameters and gtt titrated accordingly. Patient now hypotensive. Gtt stopped. Neurologist at bedside. 241ml Ns bolus given with no change in BP.   Neurosurgery PA arrived to patient. At this time the patient's pupils were now 5's bilaterally and nonreactive. The patient also had a decrease in his brainstem reflexes as well as no withdrawal to noxious stimuli in his LLE with minimal withdrawal in his RLE. Neurosurgery and neurology notified of this change. Neurosurgery orders CTA to aide in decision of EVD or crani. Upon neurosurgery Md. Assessment, patient now with no movement and GCS of 3. Decision made not to proceed with any interventions and to tx to ICU for management. ICU bedside handoff with 4N RN [place].

## 2016-09-18 NOTE — ED Notes (Signed)
Neurologist made aware family would like a update.

## 2016-09-18 NOTE — Consult Note (Signed)
CC:  Chief Complaint  Patient presents with  . unresponsive    HPI: Kurt Sexton is a 76 y.o. male who was brought to ER after being found unresponsive in the bathroom at home. Last seen normal 1230. Pt is currently intubated. Unable to obtain history. No family present at bedside. History is obtained from chart review and very limited. Hx superior cerebellar AVM and HTN.  PMH: Past Medical History:  Diagnosis Date  . BPH (benign prostatic hyperplasia)    Dr [name]  . CHF (congestive heart failure) ([place])   . CVA (cerebral vascular accident) ([place]) 1998   denies residual on 04/17/2015  . High cholesterol   . History of gout   . HTN (hypertension)   . LBP (low back pain) 2010  . Osteoarthritis    "right knee; right hand" (04/17/2015)  . Prostate cancer ([place]) 01/2008   gleason 3+4=7, PSA 4.93, vol 34.18 cc  . Venous insufficiency   . Vitamin D deficiency     PSH: Past Surgical History:  Procedure Laterality Date  . ANTERIOR CERVICAL DECOMP/DISCECTOMY FUSION N/A 11/22/2014   Procedure: Cervical Six Corpectomy with graft and plating Cervical Five-Seven;  Surgeon: Erline Levine, MD;  Location: [facility];  Service: Neurosurgery;  Laterality: N/A;  . BACK SURGERY    . CORRECTION HAMMER TOE Left   . INGUINAL HERNIA REPAIR Bilateral   . KNEE ARTHROSCOPY W/ PCL AND LCL  REPAIR & TENDON GRAFT Left   . PROSTATE BIOPSY  01/2008   gleason 7  . PROSTATE BIOPSY  03/27/13   gleason 3+4=7, volume 34.18 cc  . TONSILLECTOMY      SH: Social History  Substance Use Topics  . Smoking status: Never Smoker  . Smokeless tobacco: Never Used  . Alcohol use No    MEDS: Prior to Admission medications   Medication Sig Start Date End Date Taking? Authorizing Provider  aspirin 81 MG EC tablet Take 81 mg by mouth daily.      [provider]  betamethasone dipropionate (DIPROLENE) 0.05 % cream Apply 1 application topically 2 (two) times daily as needed (dermatitis). Reported on 05/27/2015  11/04/14   [provider]  Cholecalciferol (VITAMIN D3) 2000 units capsule Take 1 capsule (2,000 Units total) by mouth daily. 04/23/15   Plotnikov, Evie Lacks, MD  ferrous sulfate 325 (65 FE) MG tablet take 1 tablet by mouth twice a day with meals 05/27/16   Plotnikov, Evie Lacks, MD  HYDROcodone-acetaminophen (NORCO) 7.5-325 MG tablet Take 1 tablet by mouth 4 (four) times daily as needed for moderate pain. 08/18/16   Plotnikov, Evie Lacks, MD  hydrOXYzine (ATARAX/VISTARIL) 25 MG tablet Take 1 tablet (25 mg total) by mouth every 8 (eight) hours as needed for itching or nausea. 07/10/14   Plotnikov, Evie Lacks, MD  labetalol (NORMODYNE) 200 MG tablet TAKE 1 TABLET BY MOUTH 3 TIMES DAILY 12/22/15   Plotnikov, Evie Lacks, MD  meclizine (ANTIVERT) 12.5 MG tablet Take 12.5 mg by mouth every 6 (six) hours as needed for dizziness.    [provider]  tiZANidine (ZANAFLEX) 2 MG tablet Take 1 tablet (2 mg total) by mouth at bedtime as needed for muscle spasms. 07/24/15   Narda Amber K, DO  torsemide (DEMADEX) 20 MG tablet Take 1 tablet (20 mg total) by mouth daily. Patient taking differently: Take 20 mg by mouth daily as needed (fluid).  04/23/15   Plotnikov, Evie Lacks, MD  triamcinolone ointment (KENALOG) 0.1 % APPLY TO AFFECTED AREA TWICE  A DAY IF NEEDED FOR SKIN IRRITATION 07/06/16   Plotnikov, Evie Lacks, MD    ALLERGY: Allergies  Allergen Reactions  . Diazepam Other (See Comments)    lethargic  . Amlodipine Besylate Other (See Comments)    dizzy  . Cozaar     Severe headache  . Doxazosin Mesylate Other (See Comments)    dizzy  . Lisinopril Other (See Comments)    headache  . Ramipril Other (See Comments)    headache  . Spironolactone Other (See Comments)    High potassium  . Bactrim [Sulfamethoxazole-Trimethoprim] Rash    ROS: unable to obtain ROS  Vitals:   08/23/2016 1500 09/13/2016 1504  BP: (!) 80/54 (!) 74/52  Pulse: 87 89  Resp: 14 14   Exam Intubated Pupils round,  reactive but sluggish Withdraws bilateral lower extremities to pain Does not withdraw bilateral upper extremities to pain No antigravity  IMAGING: CT HEAD IMPRESSION: 1. Acute right cerebellar intraparenchymal hemorrhage with 5.5 x 2.9 x 2.5 cm intraparenchymal hematoma with intraventricular penetration and early hydrocephalus.  IMPRESSION/PLAN - 76 y.o. male with acute right cerebellar intraparenchymal hemorrhage. His neuro exam is listed above and is poor. Patient has a poor prognosis. Dr Cyndy Freeze notified and present discussing case with family.

## 2016-09-18 NOTE — ED Notes (Signed)
Neurologist, Ccm and Chief of Staff PA at bedside. All aware of current BP. 70/54.

## 2016-09-18 NOTE — Progress Notes (Signed)
Chaplain paged to 4N30 for EOL.  Patient had family at bedside.  Family was praying over patient. Chaplain offered emotional support and advised she was available all evening.

## 2016-09-18 NOTE — ED Notes (Signed)
All drips have been discontinued at this time due to BP.

## 2016-09-18 NOTE — ED Notes (Signed)
Patient to CT 1 with RT, RN x 2

## 2016-09-18 NOTE — ED Notes (Signed)
This RN went over to CT with PA neuro surgery to see CT scans in CT room. Able to take video to send to MD Ditty. Will hold in ED to Ditty makes decision on care.

## 2016-09-18 NOTE — ED Triage Notes (Signed)
Patient arrived by [facility] after falling off toilet, EMS found patient unresponsive and vomiting. Only groins on arrival and withdraws upper extremities to pain. Pupils equal and size 2. Patient was at normal baseline prior to going into bathroom. BP 220/130 in route. Rhonchi noted bilateral lungs. Large amounts of emesis noted on patient

## 2016-09-18 NOTE — ED Notes (Signed)
Code Stroke to be activated by [employer]

## 2016-09-18 NOTE — Progress Notes (Signed)
[facility] called d/t pt desat and hypoxic ABG results. No new RT orders received.

## 2016-09-18 NOTE — H&P (Signed)
Admission H&P  Kurt Sexton is an 76 y.o. male.   Chief Complaint: Unresponsive HPI: He was found unresponsive in the bathroom at home.  His BP was highly elevated on admission.  He has a known history of HTN but also of superior cerebellar AVM.  CT Brain shows large right cerebellar hemorrhage with extension into all the ventricles and mild hydrocephalus.    Past Medical History:  Diagnosis Date  . BPH (benign prostatic hyperplasia)    Dr [name]  . CHF (congestive heart failure) ([place])   . CVA (cerebral vascular accident) ([place]) 1998   denies residual on 04/17/2015  . High cholesterol   . History of gout   . HTN (hypertension)   . LBP (low back pain) 2010  . Osteoarthritis    "right knee; right hand" (04/17/2015)  . Prostate cancer ([place]) 01/2008   gleason 3+4=7, PSA 4.93, vol 34.18 cc  . Venous insufficiency   . Vitamin D deficiency     Past Surgical History:  Procedure Laterality Date  . ANTERIOR CERVICAL DECOMP/DISCECTOMY FUSION N/A 11/22/2014   Procedure: Cervical Six Corpectomy with graft and plating Cervical Five-Seven;  Surgeon: Erline Levine, MD;  Location: [place] [facility];  Service: Neurosurgery;  Laterality: N/A;  . BACK SURGERY    . CORRECTION HAMMER TOE Left   . INGUINAL HERNIA REPAIR Bilateral   . KNEE ARTHROSCOPY W/ PCL AND LCL  REPAIR & TENDON GRAFT Left   . PROSTATE BIOPSY  01/2008   gleason 7  . PROSTATE BIOPSY  03/27/13   gleason 3+4=7, volume 34.18 cc  . TONSILLECTOMY      Family History  Problem Relation Age of Onset  . Cancer Brother        prostate, tx w/xrt  . Diabetes Other        1st degree relative  . Hypertension Other    Social History:  reports that he has never smoked. He has never used smokeless tobacco. He reports that he does not drink alcohol or use drugs.  Allergies:  Allergies  Allergen Reactions  . Diazepam Other (See Comments)    lethargic  . Amlodipine Besylate Other (See Comments)    dizzy  . Cozaar     Severe headache  . Doxazosin  Mesylate Other (See Comments)    dizzy  . Lisinopril Other (See Comments)    headache  . Ramipril Other (See Comments)    headache  . Spironolactone Other (See Comments)    High potassium  . Bactrim [Sulfamethoxazole-Trimethoprim] Rash     (Not in a hospital admission)  Results for orders placed or performed during the hospital encounter of 08/29/2016 (from the past 48 hour(s))  CBG monitoring, ED     Status: Abnormal   Collection Time: 08/31/2016  1:38 PM  Result Value Ref Range   Glucose-Capillary 145 (H) 65 - 99 mg/dL  I-stat troponin, ED     Status: None   Collection Time: 08/23/2016  1:39 PM  Result Value Ref Range   Troponin i, poc 0.00 0.00 - 0.08 ng/mL   Comment 3            Comment: Due to the release kinetics of cTnI, a negative result within the first hours of the onset of symptoms does not rule out myocardial infarction with certainty. If myocardial infarction is still suspected, repeat the test at appropriate intervals.   I-stat chem 8, ed     Status: Abnormal   Collection Time: 09/12/2016  1:40 PM  Result Value Ref Range   Sodium 141 135 - 145 mmol/L   Potassium 3.5 3.5 - 5.1 mmol/L   Chloride 103 101 - 111 mmol/L   BUN 21 (H) 6 - 20 mg/dL   Creatinine, Ser 1.00 0.61 - 1.24 mg/dL   Glucose, Bld 134 (H) 65 - 99 mg/dL   Calcium, Ion 1.11 (L) 1.15 - 1.40 mmol/L   TCO2 24 0 - 100 mmol/L   Hemoglobin 13.9 13.0 - 17.0 g/dL   HCT 41.0 39.0 - 52.0 %  CBC with Differential     Status: Abnormal   Collection Time: 09/01/2016  1:45 PM  Result Value Ref Range   WBC 7.8 4.0 - 10.5 K/uL   RBC 4.66 4.22 - 5.81 MIL/uL   Hemoglobin 12.4 (L) 13.0 - 17.0 g/dL   HCT 39.0 39.0 - 52.0 %   MCV 83.7 78.0 - 100.0 fL   MCH 26.6 26.0 - 34.0 pg   MCHC 31.8 30.0 - 36.0 g/dL   RDW 12.6 11.5 - 15.5 %   Platelets 217 150 - 400 K/uL   Neutrophils Relative % 48 %   Neutro Abs 3.7 1.7 - 7.7 K/uL   Lymphocytes Relative 32 %   Lymphs Abs 2.5 0.7 - 4.0 K/uL   Monocytes Relative 7 %    Monocytes Absolute 0.5 0.1 - 1.0 K/uL   Eosinophils Relative 12 %   Eosinophils Absolute 1.0 (H) 0.0 - 0.7 K/uL   Basophils Relative 1 %   Basophils Absolute 0.1 0.0 - 0.1 K/uL   Dg Chest Portable 1 View  Result Date: 08/27/2016 CLINICAL DATA:  Patient unresponsive.  Hypertensive. EXAM: PORTABLE CHEST 1 VIEW COMPARISON:  December 30, 2015 FINDINGS: The ETT terminates greater than 2 cm below the thoracic inlet and 12.5 cm above the carina. An NG tube terminates below today's film. However, the side port is above the GE junction. No pneumothorax. New infiltrate is seen in the right upper and mid lung. The cardiomediastinal silhouette is stable. No other changes. IMPRESSION: 1. The ETT terminates greater than 2 cm below the thoracic inlet and 12.5 cm above the carina. The patient may benefit from advancing the tube approximately 6 cm. The side port of the NG tube is above the GE junction. Recommend advancement. 2. Infiltrate in the right lung.  Recommend follow-up to resolution. Electronically Signed   By: Dorise Bullion III M.D   On: 09/12/2016 14:17    ROS  Physical Examination: Blood pressure (!) 221/124, pulse 96, resp. rate 14, height 6\' 6"  (1.981 m), weight 110.2 kg (243 lb), SpO2 100 %.   Neurologic Examination:  Unresponsive, intubated. Pupils small and equal.  No clear reactivity.   Face symmetric. Some withdrawal to pain in all 4 extremities.   No babinski.  No hoffman's.  Sensory, coord, gait - could not be assessed.    A/P:  Large right cerebellar ICH with intraventricular extension and mild hydrocephalus.  He will be admitted to the ICU.  BP control with IV Cardene for goal SBP <140 and DBP <90.  He will likely need an external ventricular drain and possible posterior decompressive craniectomy and therefore neurosurgery will be consulted.    [place], [place] 09/13/2016 2:30 PM

## 2016-09-18 NOTE — ED Notes (Signed)
Pharmacy to send mannitol

## 2016-09-18 NOTE — Progress Notes (Signed)
Neuro MD paged and aware of BP 70s. Levophed ordered. Will continue to monitor.

## 2016-09-18 NOTE — Progress Notes (Signed)
Family agreed to DNR status after MD spoke with them.

## 2016-09-18 NOTE — ED Notes (Addendum)
Due to acute unresponsiveness code stroke activated after arrival into trauma Dorise Hiss MD at bedside with RT [id number] by EMS

## 2016-09-18 NOTE — ED Notes (Signed)
MD at bedside. 

## 2016-09-18 NOTE — ED Notes (Signed)
Called CT to make aware we are unable to see CT scan, working on getting CT scan to cross over into his chart.
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
## 2016-09-18 NOTE — Consult Note (Signed)
PULMONARY / CRITICAL CARE MEDICINE   Name: Kurt Sexton MRN: [id number] DOB: 12-Dec-1940    ADMISSION DATE:  08/30/2016 CONSULTATION DATE:  08/20/2016  REFERRING MD:  Dr. Orlena Sheldon  CHIEF COMPLAINT:  Altered mental status  HISTORY OF PRESENT ILLNESS:    History is obtained only from [facility] records. Her age and his intubated. No family at bedside. As per the H&P was found unresponsive in his bathroom at home. On arrival patient had elevated blood pressure. 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 There was also some question of seizure in past.  Patient has CT head done which showed large right cerebellar hemorrhage with extension into the ventricles and hydrocephalus  No other history obtainable  PAST MEDICAL HISTORY :  He  has a past medical history of BPH (benign prostatic hyperplasia); CHF (congestive heart failure) ([place]); CVA (cerebral vascular accident) ([place]) (1998); High cholesterol; History of gout; HTN (hypertension); LBP (low back pain) (2010); Osteoarthritis; Prostate cancer ([place]) (01/2008); Venous insufficiency; and Vitamin D deficiency.  PAST SURGICAL HISTORY: He  has a past surgical history that includes Knee arthroscopy w/ PCL & LCL repair & tendon graft (Left); Correction hammer toe (Left); Prostate biopsy (01/2008); Prostate biopsy (03/27/13); Tonsillectomy; Anterior cervical decomp/discectomy fusion (N/A, 11/22/2014); Inguinal hernia repair (Bilateral); and Back surgery.  Allergies  Allergen Reactions  . Diazepam Other (See Comments)    lethargic  . Amlodipine Besylate Other (See Comments)    dizzy  . Cozaar     Severe headache  . Doxazosin Mesylate Other (See Comments)    dizzy  . Lisinopril Other (See Comments)    headache  . Ramipril Other (See Comments)    headache  . Spironolactone Other (See Comments)    High potassium  . Bactrim [Sulfamethoxazole-Trimethoprim] Rash    No  current facility-administered medications on file prior to encounter.    Current Outpatient Prescriptions on File Prior to Encounter  Medication Sig  . aspirin 81 MG EC tablet Take 81 mg by mouth daily.    . betamethasone dipropionate (DIPROLENE) 0.05 % cream Apply 1 application topically 2 (two) times daily as needed (dermatitis). Reported on 05/27/2015  . Cholecalciferol (VITAMIN D3) 2000 units capsule Take 1 capsule (2,000 Units total) by mouth daily.  . ferrous sulfate 325 (65 FE) MG tablet take 1 tablet by mouth twice a day with meals  . HYDROcodone-acetaminophen (NORCO) 7.5-325 MG tablet Take 1 tablet by mouth 4 (four) times daily as needed for moderate pain.  . hydrOXYzine (ATARAX/VISTARIL) 25 MG tablet Take 1 tablet (25 mg total) by mouth every 8 (eight) hours as needed for itching or nausea.  Marland Kitchen labetalol (NORMODYNE) 200 MG tablet TAKE 1 TABLET BY MOUTH 3 TIMES DAILY  . meclizine (ANTIVERT) 12.5 MG tablet Take 12.5 mg by mouth every 6 (six) hours as needed for dizziness.  Marland Kitchen tiZANidine (ZANAFLEX) 2 MG tablet Take 1 tablet (2 mg total) by mouth at bedtime as needed for muscle spasms.  Marland Kitchen torsemide (DEMADEX) 20 MG tablet Take 1 tablet (20 mg total) by mouth daily. (Patient taking differently: Take 20 mg by mouth daily as needed (fluid). )  . triamcinolone ointment (KENALOG) 0.1 % APPLY TO AFFECTED AREA TWICE A DAY IF NEEDED FOR SKIN IRRITATION    FAMILY HISTORY:  His indicated that his mother is deceased. He indicated that his father is deceased. He indicated that his sister is alive. He indicated that his  brother is alive. He indicated that his maternal grandmother is deceased. He indicated that his maternal grandfather is deceased. He indicated that his paternal grandmother is deceased. He indicated that his paternal grandfather is deceased.    SOCIAL HISTORY: He  reports that he has never smoked. He has never used smokeless tobacco. He reports that he does not drink alcohol or use  drugs.  REVIEW OF SYSTEMS:   Unobtainable  VITAL SIGNS: BP (!) 82/55   Pulse 75   Resp 14   Ht 6\' 6"  (1.981 m)   Wt 110.2 kg (243 lb)   SpO2 100%   BMI 28.08 kg/m   HEMODYNAMICS:    VENTILATOR SETTINGS: Vent Mode: PRVC FiO2 (%):  [100 %] 100 % Set Rate:  [14 bmp] 14 bmp Vt Set:  [730 mL] 730 mL PEEP:  [5 cmH20] 5 cmH20 Plateau Pressure:  [18 cmH20-21 cmH20] 18 cmH20  INTAKE / OUTPUT: No intake/output data recorded.  PHYSICAL EXAMINATION: General:  Intubated and unresponsive Neuro:  Unresponsive to painful sternal light. Pupils small and equal. No reactivity. Face symmetric. Minimal withdrawal to painful stable. HEENT:  Atraumatic normocephalic Cardiovascular:  S1-S2 positive Lungs:  Clear Abdomen:  Benign Musculoskeletal:  No cyanosis clubbing or edema Skin:  No skin rash  LABS:  BMET  Recent Labs Lab 09/15/2016 1340 09/04/2016 1345  NA 141 137  K 3.5 3.5  CL 103 105  CO2  --  23  BUN 21* 17  CREATININE 1.00 1.12  GLUCOSE 134* 130*    Electrolytes  Recent Labs Lab 08/30/2016 1345  CALCIUM 9.1  MG 1.9    CBC  Recent Labs Lab 09/06/2016 1340 09/04/2016 1345  WBC  --  7.8  HGB 13.9 12.4*  HCT 41.0 39.0  PLT  --  217    Coag's  Recent Labs Lab 09/04/2016 1429  APTT 24  INR 0.98    Sepsis Markers No results for input(s): LATICACIDVEN, PROCALCITON, O2SATVEN in the last 168 hours.  ABG No results for input(s): PHART, PCO2ART, PO2ART in the last 168 hours.  Liver Enzymes  Recent Labs Lab 08/25/2016 1345  AST 26  ALT 21  ALKPHOS 106  BILITOT 1.0  ALBUMIN 4.1    Cardiac Enzymes  Recent Labs Lab 08/22/2016 1345  TROPONINI <0.03    Glucose  Recent Labs Lab 08/24/2016 1338  GLUCAP 145*    Imaging Dg Chest Port 1 View  Result Date: 09/05/2016 CLINICAL DATA:  Intubation EXAM: PORTABLE CHEST 1 VIEW COMPARISON:  Portable exam 1355 hours compared to 1347 hours FINDINGS: Nasogastric tube extends into stomach. Tip of endotracheal  tube projects 5.7 cm above carina. Normal heart size, mediastinal contours, and pulmonary vascularity. BILATERAL pulmonary infiltrates most focal in the RIGHT upper lobe, slightly increased since previous exam. No pleural effusion or pneumothorax. IMPRESSION: Tip of endotracheal tube projects 5.7 cm above carina. Slightly increased pulmonary infiltrates, greatest in RIGHT upper lobe. Electronically Signed   By: Lavonia Dana M.D.   On: 09/16/2016 14:30   Dg Chest Portable 1 View  Result Date: 09/15/2016 CLINICAL DATA:  Patient unresponsive.  Hypertensive. EXAM: PORTABLE CHEST 1 VIEW COMPARISON:  December 30, 2015 FINDINGS: The ETT terminates greater than 2 cm below the thoracic inlet and 12.5 cm above the carina. An NG tube terminates below today's film. However, the side port is above the GE junction. No pneumothorax. New infiltrate is seen in the right upper and mid lung. The cardiomediastinal silhouette is stable. No other changes. IMPRESSION: 1.  The ETT terminates greater than 2 cm below the thoracic inlet and 12.5 cm above the carina. The patient may benefit from advancing the tube approximately 6 cm. The side port of the NG tube is above the GE junction. Recommend advancement. 2. Infiltrate in the right lung.  Recommend follow-up to resolution. Electronically Signed   By: Dorise Bullion III M.D   On: 09/15/2016 14:17   Ct Head Code Stroke Wo Contrast`  Addendum Date: 08/31/2016   ADDENDUM REPORT: 08/24/2016 16:07 ADDENDUM: Originally, incorrect patient images were presented for comparison. True comparison imaging on the correct patient is done,compared to an MRI study of 04/23/2016, CT study of 04/22/2016 and multiple older neuro imaging studies going back as far as 04/10/2005. 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 It also explains the considerable subarachnoid  component around the base of the brain. Electronically Signed   By: Nelson Chimes M.D.   On: 09/09/2016 16:07   Result Date: 09/16/2016 CLINICAL DATA:  Code stroke. Right-sided facial droop. Unresponsive. Vomiting. EXAM: CT HEAD WITHOUT CONTRAST TECHNIQUE: Contiguous axial images were obtained from the base of the skull through the vertex without intravenous contrast. COMPARISON:  09/01/2016 FINDINGS: Brain: Right intraparenchymal cerebellar hemorrhage measuring 5.5 x 2.9 x 2.5 cm (volume = 21 cm^3). Intraventricular penetration into the fourth ventricle. Blood fills the fourth ventricle as well as the perimesencephalic cisterns. Blood fills the third ventricle. Considerable amount of clot in the lateral ventricles. Ventricular dilatation. No other focal brain finding. No extra-axial collection. Vascular: There is atherosclerotic calcification of the major vessels at the base of the brain. Skull: Negative Sinuses/Orbits: Clear/normal Other: None significant IMPRESSION: 1. Acute right cerebellar intraparenchymal hemorrhage with 5.5 x 2.9 x 2.5 cm intraparenchymal hematoma with intraventricular penetration and early hydrocephalus. 2. Critical Value/emergent results were called by telephone at the time of interpretation on 09/17/2016 at 2:23 pm to Dr. Orlena Sheldon , who verbally acknowledged these results. Electronically Signed: By: Nelson Chimes M.D. On: 09/05/2016 14:26     STUDIES:  CT head showing acute right cerebellar intraparenchymal hemorrhage 001.001.001.001 cm with intraventricular penetration and early hydrocephalus.   ASSESSMENT / PLAN:  NEUROLOGY Pupils dilated and unreactive bilaterally.   No corneal reflexes. No oculocephalic reflex. No gag reflex. Not breathing over the ventilator.   No withdrawal to pain.  Neurosurgery decided against EVD due to above exam.    Plan is for terminal withdrawal once rest of the family members arrive.  STAFF NOTE: I, Sharia Reeve, MD have personally reviewed  patient's available data, including medical history, events of note, physical examination and test results as part of my evaluation.  The patient is critically ill with multiple organ systems failure and requires high complexity decision making for assessment and support, frequent evaluation and titration of therapies, application of advanced monitoring technologies and extensive interpretation of multiple databases.   Critical Care Time devoted to patient care services described in this note is 35  Minutes. This time reflects time of care of this signee: Sharia Reeve, MD.   Pulmonary and [place] Pager: [phone]  09/14/2016, 4:38 PM

## 2016-09-18 NOTE — Progress Notes (Signed)
Follow up exam performed:  Pupils dilated and unreactive bilaterally.   No corneal reflexes. No oculocephalic reflex. No gag reflex. Not breathing over the ventilator.   No withdrawal to pain.   Neurosurgery decided against EVD due to above exam.    Patient has herniated and is brain dead.  Will discuss withdrawal of care with family.

## 2016-09-19 DIAGNOSIS — G911 Obstructive hydrocephalus: Secondary | ICD-10-CM

## 2016-09-19 DIAGNOSIS — I614 Nontraumatic intracerebral hemorrhage in cerebellum: Secondary | ICD-10-CM

## 2016-09-19 DIAGNOSIS — G9382 Brain death: Secondary | ICD-10-CM

## 2016-09-19 DIAGNOSIS — I615 Nontraumatic intracerebral hemorrhage, intraventricular: Secondary | ICD-10-CM

## 2016-09-19 DIAGNOSIS — N183 Chronic kidney disease, stage 3 (moderate): Secondary | ICD-10-CM

## 2016-09-19 DEATH — deceased

## 2016-10-19 ENCOUNTER — Ambulatory Visit: Payer: Medicare Other | Admitting: Internal Medicine

## 2016-10-20 NOTE — Plan of Care (Addendum)
Met wife, daughter and son at bedside. Discussed with them regarding patient's status. I told them that patient on clinical examination is consistent with clinical brain death. Apnea test would be required for confirmation of brain death. Family requested no apnea test, but would like to wait until grandson comes over before terminally extubated. I agreed and will do terminal extubation after grandson arrives.  Jindong Xu, MD PhD Stroke Neurology 10/01/2016 4:01 PM   

## 2016-10-20 NOTE — Progress Notes (Addendum)
STROKE TEAM PROGRESS NOTE   HISTORY OF PRESENT ILLNESS (per record)  Kurt Sexton is a 76 y.o. male who was found unresponsive in the bathroom at home.  His BP was highly elevated on admission.  He has a known history of HTN but also of superior cerebellar AVM.  CT Brain shows large right cerebellar hemorrhage with extension into all the ventricles and mild hydrocephalus.      SUBJECTIVE (INTERVAL HISTORY) No family is at the bedside.  Patient still intubated, however, clinically brain dead. Waiting family arrives to discuss about withdrawal versus apnea test.   OBJECTIVE Temp:  [94.3 F (34.6 C)-101.8 F (38.8 C)] 101.8 F (38.8 C) (07/01 0901) Pulse Rate:  [36-127] 127 (07/01 0900) Cardiac Rhythm: Normal sinus rhythm;Sinus tachycardia (07/01 0736) Resp:  [0-21] 14 (07/01 0900) BP: (46-280)/(32-131) 117/71 (07/01 0900) SpO2:  [80 %-100 %] 95 % (07/01 0900) FiO2 (%):  [80 %-100 %] 100 % (07/01 0730) Weight:  [110.2 kg (243 lb)] 110.2 kg (243 lb) (06/30 1400)  CBC:   Recent Labs Lab 09/10/2016 1340 09/17/2016 1345  WBC  --  7.8  NEUTROABS  --  3.7  HGB 13.9 12.4*  HCT 41.0 39.0  MCV  --  83.7  PLT  --  683    Basic Metabolic Panel:   Recent Labs Lab 09/04/2016 1340 09/02/2016 1345  NA 141 137  K 3.5 3.5  CL 103 105  CO2  --  23  GLUCOSE 134* 130*  BUN 21* 17  CREATININE 1.00 1.12  CALCIUM  --  9.1  MG  --  1.9    Lipid Panel:     Component Value Date/Time   CHOL 198 06/25/2015 1210   TRIG 121.0 06/25/2015 1210   HDL 39.10 06/25/2015 1210   CHOLHDL 5 06/25/2015 1210   VLDL 24.2 06/25/2015 1210   LDLCALC 135 (H) 06/25/2015 1210   HgbA1c:  Lab Results  Component Value Date   HGBA1C 4.9 08/18/2016   Urine Drug Screen: No results found for: LABOPIA, COCAINSCRNUR, LABBENZ, AMPHETMU, THCU, LABBARB  Alcohol Level No results found for: [place] I have personally reviewed the radiological images below and agree with the radiology interpretations.  Ct  Head Code Stroke Wo Contrast 09/09/2016   ADDENDUM REPORT: Originally, incorrect patient images were presented for comparison. True comparison imaging on the correct patient is done,compared to an MRI study of 04/23/2016, CT study of 04/22/2016 and multiple older neuro imaging studies going back as far as 04/10/2005. 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 It also explains the considerable subarachnoid component around the base of the brain.  09/14/2016 Acute right cerebellar intraparenchymal hemorrhage with 5.5 x 2.9 x 2.5 cm intraparenchymal hematoma with intraventricular penetration and early hydrocephalus.    PHYSICAL EXAM Patient examination consistent with clinical brain death Intubated, on ventilation, not breathing over the vent. Pupil elevated 5 mm bilaterally, no reaction to light, no corneal bilaterally, no gag or cough. No doll's eyes. Cold caloric test negative for any eye movement. On pain stimulation, no movement in all extremities. DTR diminished, no Babinski.   ASSESSMENT/PLAN Mr. Kurt Sexton is a 76 y.o. male with history of prostate cancer, venous insufficiency, hypertension, gout, hyperlipidemia, hx of a superior cerebellar AVM, previous stroke, and congestive heart failure  presenting with unresponsiveness.  He did not receive IV t-PA  due to cerebellar hemorrhage.  Large right cerebellar hemorrhage with extensive IVH and hydrocephalus:  Secondary to hypertension and known AVM.  Resultant  clinical brain death  CT head - acute Rt cerebellar intraparenchymal hemorrhage with 5.5 x 2.9 x 2.5 cm hematoma.  HgbA1c - 08/18/2016 - 4.9  VTE prophylaxis - SCDs Diet NPO time specified  aspirin 81 mg daily prior to admission, now on No antithrombotic secondary to cerebellar hemorrhage.  Disposition:  Pending family arrives for withdrawal  versus apnea test  Obstructive hydrocephalus  Due to right cerebellar hemorrhage and extensive IVH  Hypertensive emergency   was on cleviprex, now off.  Currently on Levophed for BP support  Other Stroke Risk Factors  Advanced age  Hx known cerebellar AVM  Hx of stroke  CHF  Other Active Problems    Hospital day # 1  Rosalin Hawking, MD PhD Stroke Neurology 10/01/2016 12:47 PM  ADDENDUM: This patient was critically ill due to Large right cerebellar hemorrhage with extensive IVH and hydrocephalus, brian herniation and at significant risk of neurological worsening, death form brain herniation, brain death. This patient's care required constant monitoring of vital signs, hemodynamics, respiratory and cardiac monitoring, review of multiple databases, neurological assessment, discussion with family, other specialists and medical decision making of high complexity. I spent 35 minutes of neurocritical care time in the care of this patient.   Rosalin Hawking, MD PhD Stroke Neurology 09/23/2016 12:30 PM   To contact Stroke Continuity provider, please refer to [email or web address] After hours, contact General Neurology

## 2016-10-20 NOTE — Death Summary Note (Signed)
Stroke Discharge Summary  Patient ID: [name]   MRN: [id number]      DOB: 05/04/1940  Date of Admission: 09/09/2016 Date of Discharge: 10/05/16  Attending Physician:  Rosalin Hawking, MD, Stroke MD Consultant(s):   Treatment Team:  Ditty, Kevan Ny, MD pulmonary/intensive care  Patient's PCP:  [name], MD  DISCHARGE DIAGNOSIS:  Active Problems:   Brain death   Cerebellar hemorrhage ([place])   IVH   Obstructive hydrocephalus   Brain herniation   HTN   Cerebellar AVM   Hx of prostate cancer   Past Medical History:  Diagnosis Date  . BPH (benign prostatic hyperplasia)    Dr [name]  . CHF (congestive heart failure) ([place])   . CVA (cerebral vascular accident) ([place]) 1998   denies residual on 04/17/2015  . High cholesterol   . History of gout   . HTN (hypertension)   . LBP (low back pain) 2010  . Osteoarthritis    "right knee; right hand" (04/17/2015)  . Prostate cancer ([place]) 01/2008   gleason 3+4=7, PSA 4.93, vol 34.18 cc  . Venous insufficiency   . Vitamin D deficiency    Past Surgical History:  Procedure Laterality Date  . ANTERIOR CERVICAL DECOMP/DISCECTOMY FUSION N/A 11/22/2014   Procedure: Cervical Six Corpectomy with graft and plating Cervical Five-Seven;  Surgeon: Erline Levine, MD;  Location: [facility];  Service: Neurosurgery;  Laterality: N/A;  . BACK SURGERY    . CORRECTION HAMMER TOE Left   . INGUINAL HERNIA REPAIR Bilateral   . KNEE ARTHROSCOPY W/ PCL AND LCL  REPAIR & TENDON GRAFT Left   . PROSTATE BIOPSY  01/2008   gleason 7  . PROSTATE BIOPSY  03/27/13   gleason 3+4=7, volume 34.18 cc  . TONSILLECTOMY      Allergies as of Oct 05, 2016      Reactions   Diazepam Other (See Comments)   lethargic   Amlodipine Besylate Other (See Comments)   dizzy   Cozaar    Severe headache   Doxazosin Mesylate Other (See Comments)   dizzy   Lisinopril Other (See Comments)   headache   Ramipril Other (See Comments)   headache   Spironolactone Other  (See Comments)   High potassium   Bactrim [sulfamethoxazole-trimethoprim] Rash      LABORATORY STUDIES CBC    Component Value Date/Time   WBC 7.8 09/04/2016 1345   RBC 4.66 09/08/2016 1345   HGB 12.4 (L) 08/23/2016 1345   HCT 39.0 09/14/2016 1345   PLT 217 08/28/2016 1345   MCV 83.7 09/02/2016 1345   MCH 26.6 09/11/2016 1345   MCHC 31.8 09/14/2016 1345   RDW 12.6 08/31/2016 1345   LYMPHSABS 2.5 09/17/2016 1345   MONOABS 0.5 09/10/2016 1345   EOSABS 1.0 (H) 08/22/2016 1345   BASOSABS 0.1 08/29/2016 1345   CMP    Component Value Date/Time   NA 137 09/17/2016 1345   K 3.5 09/04/2016 1345   CL 105 08/25/2016 1345   CO2 23 09/07/2016 1345   GLUCOSE 130 (H) 08/21/2016 1345   BUN 17 09/06/2016 1345   CREATININE 1.12 09/08/2016 1345   CALCIUM 9.1 09/16/2016 1345   PROT 7.6 09/06/2016 1345   ALBUMIN 4.1 08/25/2016 1345   AST 26 09/01/2016 1345   ALT 21 09/06/2016 1345   ALKPHOS 106 09/17/2016 1345   BILITOT 1.0 09/02/2016 1345   GFRNONAA >60 09/14/2016 1345   GFRAA >60 09/16/2016 1345   COAGS Lab Results  Component Value Date   INR 0.98 09/09/2016   INR 1.01 10/25/2010   Lipid Panel    Component Value Date/Time   CHOL 198 06/25/2015 1210   TRIG 121.0 06/25/2015 1210   HDL 39.10 06/25/2015 1210   CHOLHDL 5 06/25/2015 1210   VLDL 24.2 06/25/2015 1210   LDLCALC 135 (H) 06/25/2015 1210   HgbA1C  Lab Results  Component Value Date   HGBA1C 4.9 08/18/2016   Urinalysis    Component Value Date/Time   COLORURINE YELLOW 06/25/2015 1210   APPEARANCEUR CLEAR 06/25/2015 1210   LABSPEC 1.020 06/25/2015 1210   PHURINE 6.0 06/25/2015 1210   GLUCOSEU NEGATIVE 06/25/2015 1210   HGBUR NEGATIVE 06/25/2015 1210   BILIRUBINUR NEGATIVE 06/25/2015 1210   KETONESUR NEGATIVE 06/25/2015 1210   PROTEINUR 30 (A) 04/16/2015 1225   UROBILINOGEN 0.2 06/25/2015 1210   NITRITE NEGATIVE 06/25/2015 1210   LEUKOCYTESUR NEGATIVE 06/25/2015 1210   Urine Drug Screen No results found  for: LABOPIA, COCAINSCRNUR, LABBENZ, AMPHETMU, THCU, LABBARB  Alcohol Level No results found for: [place] I have personally reviewed the radiological images below and agree with the radiology interpretations.  Ct Head Code Stroke Wo Contrast 08/24/2016   ADDENDUM REPORT: Originally, incorrect patient images were presented for comparison. True comparison imaging on the correct patient is done,compared to an MRI study of 04/23/2016, CT study of 04/22/2016 and multiple older neuro imaging studies going back as far as 04/10/2005. 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 It also explains the considerable subarachnoid component around the base of the brain.  09/13/2016 Acute right cerebellar intraparenchymal hemorrhage with 5.5 x 2.9 x 2.5 cm intraparenchymal hematoma with intraventricular penetration and early hydrocephalus.    HISTORY OF PRESENT ILLNESS Kurt Lightsey Poseyis a 76 y.o.male who was found unresponsive in the bathroom at home. His BP was highly elevated on admission. He has a known history of HTN but also of superior cerebellar AVM. CT Brain shows large right cerebellar hemorrhage with extension into all the ventricles and mild hydrocephalus.    HOSPITAL COURSE Mr. Kurt Sexton is a 76 y.o. male with history of prostate cancer, venous insufficiency, hypertension, gout, hyperlipidemia, hx of a superior cerebellar AVM, previous stroke, and congestive heart failure  presenting with unresponsiveness.   Large right cerebellar hemorrhage with extensive IVH and hydrocephalus: Secondary to hypertension and known AVM.  Resultant clinical brain death  CT head - acute Rt cerebellar intraparenchymal hemorrhage with 5.5 x 2.9 x 2.5 cm hematoma.  HgbA1c - 08/18/2016 - 4.9  VTE prophylaxis - SCDs  Diet NPO time  specified  aspirin 81 mg daily prior to admission  Disposition:  withdrawal after all family member arrived  Obstructive hydrocephalus  Due to right cerebellar hemorrhage and extensive IVH  Increased ICP  Hypertensive emergency   was on cleviprex, off after  Other Stroke Risk Factors  Advanced age  Hx known cerebellar AVM  Hx of stroke  CHF  Other Active Problems  hx of prostate cancer   DISCHARGE EXAM Deceased   15 minutes were spent preparing discharge.  Rosalin Hawking, MD PhD Stroke Neurology 09-23-2016 5:16 PM

## 2016-10-20 NOTE — Progress Notes (Signed)
Family has arrived. Dr Erlinda Hong paged.

## 2016-10-20 NOTE — Progress Notes (Signed)
PT Cancellation Note  Patient Details Name: LAJARVIS ITALIANO MRN: [id number] DOB: Nov 29, 1940   Cancelled Treatment:    Reason Eval/Treat Not Completed: Patient not medically ready, active bedrest.   Duncan Dull 09/23/2016, 6:46 AM Alben Deeds, PT DPT [facility] [phone]

## 2016-10-20 NOTE — Progress Notes (Signed)
Patient expired at 20 with RN and RT in attendance. Dr. Erlinda Hong notified. CDS notified. Family would like to come back and view the body prior to removing from unit.

## 2016-10-20 NOTE — Progress Notes (Addendum)
Pt family requested to speak with MD in regards to care, MD paged and Cheral Marker came to speak with the family. He addressed the circumstances and fatality of pts condition. [place] unable to express comfort care measures at this time.  I personally spoke with the family later, and they are now aware that they must make the decision to withdraw care. Family decided to go home for the night and return in the morning with a decision.

## 2016-10-20 NOTE — Progress Notes (Signed)
SLP Cancellation Note  Patient Details Name: CAYDYN SPRUNG MRN: [id number] DOB: 03-29-1940   Cancelled treatment:       Reason Eval/Treat Not Completed: Medical issues which prohibited therapy. Pt on the vent and may be transitioning to comfort care per chart review.    Germain Osgood 10-01-2016, 7:18 AM  Germain Osgood, M.A. CCC-SLP [phone]

## 2016-10-20 NOTE — Progress Notes (Signed)
Pt extubated per family wishes and per MD order.

## 2016-10-20 NOTE — Progress Notes (Signed)
OT Cancellation Note  Patient Details Name: Kurt Sexton MRN: [id number] DOB: 02/23/1941   Cancelled Treatment:    Reason Eval/Treat Not Completed: Patient not medically ready (active bedrest orders).  Binnie Kand M.S., OTR/L Pager: [phone]  October 05, 2016, 8:07 AM

## 2016-10-20 NOTE — Progress Notes (Signed)
All tubes removed and body prepared, dentures placed in mouth per wife's request. Family wishes to come see patient for final goodbye and then body will be transported to [place]. Death certificate has been signed.

## 2016-10-20 DEATH — deceased

## 2016-11-23 ENCOUNTER — Ambulatory Visit: Payer: Medicare Other | Admitting: Sports Medicine

## 2018-01-31 IMAGING — DX DG CHEST 1V PORT
1 series · 1 of 1 positions shown · non-contrast
Comparison: Portable exam 2611 hours compared to 8069 hours

CLINICAL DATA: Intubation

EXAM:
PORTABLE CHEST 1 VIEW

[chest ap]
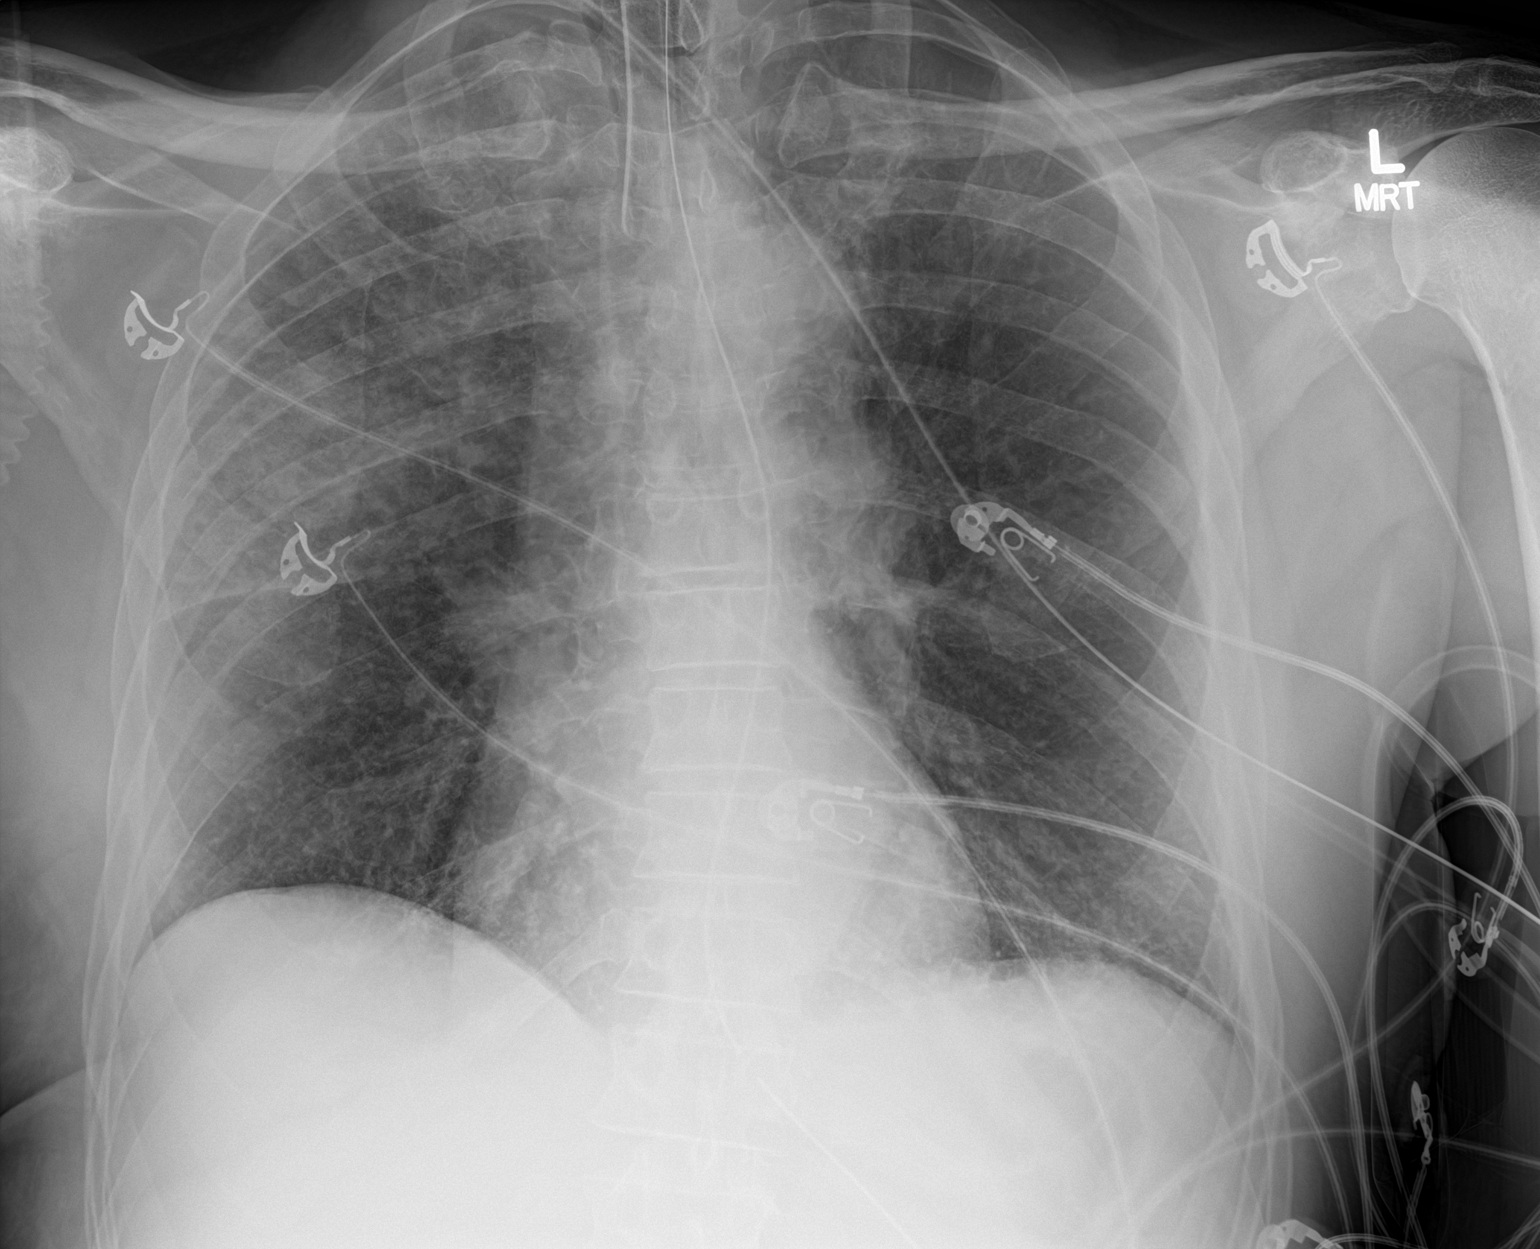

[1 of 1 positions shown; findings below may reference images not displayed]

FINDINGS: Nasogastric tube extends into stomach.

Tip of endotracheal tube projects 5.7 cm above carina.

Normal heart size, mediastinal contours, and pulmonary vascularity.

BILATERAL pulmonary infiltrates most focal in the RIGHT upper lobe,
slightly increased since previous exam.

No pleural effusion or pneumothorax.
IMPRESSION: Tip of endotracheal tube projects 5.7 cm above carina.

Slightly increased pulmonary infiltrates, greatest in RIGHT upper
lobe.
